# Patient Record
Sex: Female | Born: 1955 | Race: White | Hispanic: No | Marital: Single | Smoking: Former smoker
Health system: Southern US, Community
[De-identification: ages and names within clinical notes are randomized; demographics above are authoritative.]

## PROBLEM LIST (undated history)

## (undated) DIAGNOSIS — D649 Anemia, unspecified: Secondary | ICD-10-CM

## (undated) DIAGNOSIS — E039 Hypothyroidism, unspecified: Secondary | ICD-10-CM

## (undated) DIAGNOSIS — M549 Dorsalgia, unspecified: Secondary | ICD-10-CM

## (undated) DIAGNOSIS — F319 Bipolar disorder, unspecified: Secondary | ICD-10-CM

## (undated) DIAGNOSIS — F32A Depression, unspecified: Secondary | ICD-10-CM

## (undated) DIAGNOSIS — E119 Type 2 diabetes mellitus without complications: Secondary | ICD-10-CM

## (undated) DIAGNOSIS — R269 Unspecified abnormalities of gait and mobility: Secondary | ICD-10-CM

## (undated) DIAGNOSIS — R413 Other amnesia: Secondary | ICD-10-CM

## (undated) DIAGNOSIS — E785 Hyperlipidemia, unspecified: Secondary | ICD-10-CM

## (undated) DIAGNOSIS — G8929 Other chronic pain: Secondary | ICD-10-CM

## (undated) DIAGNOSIS — F603 Borderline personality disorder: Secondary | ICD-10-CM

## (undated) DIAGNOSIS — I10 Essential (primary) hypertension: Secondary | ICD-10-CM

## (undated) DIAGNOSIS — K219 Gastro-esophageal reflux disease without esophagitis: Secondary | ICD-10-CM

## (undated) DIAGNOSIS — F259 Schizoaffective disorder, unspecified: Secondary | ICD-10-CM

## (undated) DIAGNOSIS — F329 Major depressive disorder, single episode, unspecified: Secondary | ICD-10-CM

## (undated) DIAGNOSIS — F209 Schizophrenia, unspecified: Secondary | ICD-10-CM

## (undated) HISTORY — PX: KNEE SURGERY: SHX244

## (undated) HISTORY — DX: Other amnesia: R41.3

## (undated) HISTORY — PX: APPENDECTOMY: SHX54

## (undated) HISTORY — PX: CHOLECYSTECTOMY: SHX55

## (undated) HISTORY — DX: Unspecified abnormalities of gait and mobility: R26.9

---

## 1998-09-27 ENCOUNTER — Inpatient Hospital Stay (HOSPITAL_COMMUNITY): Admission: AD | Admit: 1998-09-27 | Discharge: 1998-09-30 | Payer: Self-pay | Admitting: Psychiatry

## 2006-11-02 ENCOUNTER — Emergency Department (HOSPITAL_COMMUNITY): Admission: EM | Admit: 2006-11-02 | Discharge: 2006-11-02 | Payer: Self-pay | Admitting: Emergency Medicine

## 2006-12-04 ENCOUNTER — Ambulatory Visit (HOSPITAL_COMMUNITY): Admission: RE | Admit: 2006-12-04 | Discharge: 2006-12-04 | Payer: Self-pay | Admitting: Internal Medicine

## 2007-09-03 ENCOUNTER — Encounter: Admission: RE | Admit: 2007-09-03 | Discharge: 2007-09-03 | Payer: Self-pay | Admitting: Internal Medicine

## 2007-09-21 ENCOUNTER — Emergency Department (HOSPITAL_COMMUNITY): Admission: EM | Admit: 2007-09-21 | Discharge: 2007-09-21 | Payer: Self-pay | Admitting: Emergency Medicine

## 2007-12-08 ENCOUNTER — Ambulatory Visit (HOSPITAL_COMMUNITY): Admission: RE | Admit: 2007-12-08 | Discharge: 2007-12-08 | Payer: Self-pay | Admitting: Internal Medicine

## 2009-08-16 ENCOUNTER — Encounter: Admission: RE | Admit: 2009-08-16 | Discharge: 2009-08-16 | Payer: Self-pay | Admitting: Internal Medicine

## 2010-10-08 ENCOUNTER — Encounter: Payer: Self-pay | Admitting: Internal Medicine

## 2010-12-31 ENCOUNTER — Emergency Department (HOSPITAL_COMMUNITY)
Admission: EM | Admit: 2010-12-31 | Discharge: 2011-01-01 | Disposition: A | Discharge status: Other Acute Inpatient Hospital | Payer: Medicare Other | Source: Home / Self Care | Attending: Emergency Medicine | Admitting: Emergency Medicine

## 2010-12-31 DIAGNOSIS — E0590 Thyrotoxicosis, unspecified without thyrotoxic crisis or storm: Secondary | ICD-10-CM | POA: Insufficient documentation

## 2010-12-31 DIAGNOSIS — I10 Essential (primary) hypertension: Secondary | ICD-10-CM | POA: Insufficient documentation

## 2010-12-31 DIAGNOSIS — F329 Major depressive disorder, single episode, unspecified: Secondary | ICD-10-CM | POA: Insufficient documentation

## 2010-12-31 DIAGNOSIS — Z79899 Other long term (current) drug therapy: Secondary | ICD-10-CM | POA: Insufficient documentation

## 2010-12-31 DIAGNOSIS — E119 Type 2 diabetes mellitus without complications: Secondary | ICD-10-CM | POA: Insufficient documentation

## 2010-12-31 DIAGNOSIS — R45851 Suicidal ideations: Secondary | ICD-10-CM | POA: Insufficient documentation

## 2010-12-31 DIAGNOSIS — F3289 Other specified depressive episodes: Secondary | ICD-10-CM | POA: Insufficient documentation

## 2010-12-31 DIAGNOSIS — F209 Schizophrenia, unspecified: Secondary | ICD-10-CM | POA: Insufficient documentation

## 2010-12-31 DIAGNOSIS — IMO0002 Reserved for concepts with insufficient information to code with codable children: Secondary | ICD-10-CM | POA: Insufficient documentation

## 2010-12-31 LAB — COMPREHENSIVE METABOLIC PANEL
ALT: 9 U/L (ref 0–35)
AST: 14 U/L (ref 0–37)
Albumin: 3.6 g/dL (ref 3.5–5.2)
Alkaline Phosphatase: 46 U/L (ref 39–117)
BUN: 23 mg/dL (ref 6–23)
CO2: 27 mEq/L (ref 19–32)
Calcium: 9.3 mg/dL (ref 8.4–10.5)
Chloride: 99 mEq/L (ref 96–112)
Creatinine, Ser: 1.38 mg/dL — ABNORMAL HIGH (ref 0.4–1.2)
GFR calc Af Amer: 48 mL/min — ABNORMAL LOW (ref >60)
GFR calc non Af Amer: 40 mL/min — ABNORMAL LOW (ref >60)
Glucose, Bld: 107 mg/dL — ABNORMAL HIGH (ref 70–99)
Potassium: 5.2 mEq/L — ABNORMAL HIGH (ref 3.5–5.1)
Sodium: 135 mEq/L (ref 135–145)
Total Bilirubin: 0.6 mg/dL (ref 0.3–1.2)
Total Protein: 7.4 g/dL (ref 6.0–8.3)

## 2010-12-31 LAB — URINALYSIS, ROUTINE W REFLEX MICROSCOPIC
Bilirubin Urine: NEGATIVE
Glucose, UA: NEGATIVE mg/dL
Hgb urine dipstick: NEGATIVE
Nitrite: NEGATIVE
Protein, ur: NEGATIVE mg/dL
Specific Gravity, Urine: 1.025 (ref 1.005–1.030)
Urobilinogen, UA: 1 mg/dL (ref 0.0–1.0)
pH: 7 (ref 5.0–8.0)

## 2010-12-31 LAB — DIFFERENTIAL
Basophils Absolute: 0 10*3/uL (ref 0.0–0.1)
Basophils Relative: 0 % (ref 0–1)
Eosinophils Absolute: 0.3 10*3/uL (ref 0.0–0.7)
Eosinophils Relative: 3 % (ref 0–5)
Lymphocytes Relative: 29 % (ref 12–46)
Lymphs Abs: 2.9 10*3/uL (ref 0.7–4.0)
Monocytes Absolute: 1.2 10*3/uL — ABNORMAL HIGH (ref 0.1–1.0)
Monocytes Relative: 12 % (ref 3–12)
Neutro Abs: 5.7 10*3/uL (ref 1.7–7.7)
Neutrophils Relative %: 57 % (ref 43–77)

## 2010-12-31 LAB — VALPROIC ACID LEVEL: Valproic Acid Lvl: 59.4 ug/mL (ref 50.0–100.0)

## 2010-12-31 LAB — RAPID URINE DRUG SCREEN, HOSP PERFORMED
Amphetamines: NOT DETECTED
Barbiturates: NOT DETECTED
Benzodiazepines: NOT DETECTED
Cocaine: NOT DETECTED
Opiates: NOT DETECTED
Tetrahydrocannabinol: NOT DETECTED

## 2010-12-31 LAB — CBC
HCT: 37.9 % (ref 36.0–46.0)
Hemoglobin: 12.8 g/dL (ref 12.0–15.0)
MCH: 30 pg (ref 26.0–34.0)
MCHC: 33.8 g/dL (ref 30.0–36.0)
MCV: 89 fL (ref 78.0–100.0)
Platelets: 326 10*3/uL (ref 150–400)
RBC: 4.26 MIL/uL (ref 3.87–5.11)
RDW: 13.1 % (ref 11.5–15.5)
WBC: 10.1 10*3/uL (ref 4.0–10.5)

## 2010-12-31 LAB — ETHANOL: Alcohol, Ethyl (B): <5 mg/dL (ref 0–10)

## 2011-01-01 ENCOUNTER — Inpatient Hospital Stay (HOSPITAL_COMMUNITY)
Admission: AD | Admit: 2011-01-01 | Discharge: 2011-01-11 | DRG: 885 | Disposition: A | Discharge status: Home / Self Care | Payer: Medicare Other | Source: Ambulatory Visit | Attending: Psychiatry | Admitting: Psychiatry

## 2011-01-01 DIAGNOSIS — I129 Hypertensive chronic kidney disease with stage 1 through stage 4 chronic kidney disease, or unspecified chronic kidney disease: Secondary | ICD-10-CM

## 2011-01-01 DIAGNOSIS — Z7982 Long term (current) use of aspirin: Secondary | ICD-10-CM

## 2011-01-01 DIAGNOSIS — S61509A Unspecified open wound of unspecified wrist, initial encounter: Secondary | ICD-10-CM

## 2011-01-01 DIAGNOSIS — F1020 Alcohol dependence, uncomplicated: Secondary | ICD-10-CM

## 2011-01-01 DIAGNOSIS — E039 Hypothyroidism, unspecified: Secondary | ICD-10-CM

## 2011-01-01 DIAGNOSIS — F603 Borderline personality disorder: Secondary | ICD-10-CM

## 2011-01-01 DIAGNOSIS — E119 Type 2 diabetes mellitus without complications: Secondary | ICD-10-CM

## 2011-01-01 DIAGNOSIS — F3130 Bipolar disorder, current episode depressed, mild or moderate severity, unspecified: Secondary | ICD-10-CM

## 2011-01-01 DIAGNOSIS — E785 Hyperlipidemia, unspecified: Secondary | ICD-10-CM

## 2011-01-01 DIAGNOSIS — K219 Gastro-esophageal reflux disease without esophagitis: Secondary | ICD-10-CM

## 2011-01-01 DIAGNOSIS — N189 Chronic kidney disease, unspecified: Secondary | ICD-10-CM

## 2011-01-01 DIAGNOSIS — F259 Schizoaffective disorder, unspecified: Principal | ICD-10-CM

## 2011-01-01 DIAGNOSIS — X838XXA Intentional self-harm by other specified means, initial encounter: Secondary | ICD-10-CM

## 2011-01-01 DIAGNOSIS — E875 Hyperkalemia: Secondary | ICD-10-CM

## 2011-01-01 DIAGNOSIS — E871 Hypo-osmolality and hyponatremia: Secondary | ICD-10-CM

## 2011-01-01 DIAGNOSIS — Z560 Unemployment, unspecified: Secondary | ICD-10-CM

## 2011-01-01 LAB — GLUCOSE, CAPILLARY: Glucose-Capillary: 89 mg/dL (ref 70–99)

## 2011-01-02 LAB — BASIC METABOLIC PANEL
BUN: 27 mg/dL — ABNORMAL HIGH (ref 6–23)
CO2: 29 mEq/L (ref 19–32)
Calcium: 9 mg/dL (ref 8.4–10.5)
Chloride: 90 mEq/L — ABNORMAL LOW (ref 96–112)
Creatinine, Ser: 1.37 mg/dL — ABNORMAL HIGH (ref 0.4–1.2)
GFR calc Af Amer: 49 mL/min — ABNORMAL LOW (ref >60)
GFR calc non Af Amer: 40 mL/min — ABNORMAL LOW (ref >60)
Glucose, Bld: 156 mg/dL — ABNORMAL HIGH (ref 70–99)
Potassium: 4.9 mEq/L (ref 3.5–5.1)
Sodium: 126 mEq/L — ABNORMAL LOW (ref 135–145)

## 2011-01-02 LAB — GLUCOSE, CAPILLARY: Glucose-Capillary: 103 mg/dL — ABNORMAL HIGH (ref 70–99)

## 2011-01-02 NOTE — Consult Note (Addendum)
  [name], [name]               ACCOUNT NO.:  [id number]  MEDICAL RECORD NO.:  [id number]           PATIENT TYPE:  E  LOCATION:  WLED                         FACILITY:  [facility]  PHYSICIAN:  Eulogio Ditch, MD DATE OF BIRTH:  1955-10-26  DATE OF CONSULTATION:  01/01/2011 DATE OF DISCHARGE:                                CONSULTATION   REASON FOR CONSULTATION:  Hearing voices and suicidal.  HISTORY OF PRESENT ILLNESS:  I saw the patient and reviewed the medical records.  A 55 year old white female with history of bipolar disorder, schizophrenia who was admitted to [facility] ED after she started having suicidal thoughts.  On asking whether she was compliant with her medications, she told me she takes medications every day.  Her Depakote level is within normal limits at 53.  She told me that she was feeling depressed because of family situation and where she lives, the patient lives at a group home.  Currently, the patient still has suicidal thoughts on and off with a plan to cut her wrist.  She also reported hearing voices but they are non-commanding type.  The patient had previous 2 suicide attempts by overdose.  The patient is followed by Dr. Senaida Ores, psychiatrist, in the outpatient setting.  The patient denies any drug abuse.  MEDICAL ISSUES:  The patient also has a history of GERD, hypertension, hyperthyroidism, hyperlipidemia.  ALLERGIES:  No known drug allergies.  LABORATORY DATA:  Labs within normal limits.  Alcohol level less than 5. Depakote level 59.4.  UDS is negative.  PHYSICAL EXAMINATION:  VITAL SIGNS:  Within normal limits. Physical examination is within normal limits, done by Dr. Doug Sou.  MENTAL STATUS EXAMINATION:  The patient is calm, cooperative during the interview.  Fair eye contact.  Hygiene, grooming fair.  No abnormal movements noticed.  Mood depressed, affect constricted.  Thought form logical and goal directed.  Thought content,  still has suicidal ideations on and off with a plan to cut her wrist, not delusional. Thought perception, the patient reported hearing voices on and off non- command type.  Does not seem to be internally preoccupied.  The patient is easily redirectable but patient is not forthcoming in providing the history in detail.  Cognition, alert, awake, oriented x3.  Memory, immediate, recent remote fair.  Attention and concentration fair. Abstraction ability fair.  Insight and judgment fair.  DIAGNOSIS:  AXIS I:  As per history.  Bipolar disorder, mixed type; rule out schizoaffective disorder. AXIS II:  Deferred. AXIS III:  Diabetes, gastroesophageal reflux disease, hypertension, hyperthyroidism. AXIS IV:  Chronic mental issues. AXIS V:  30 to 40  RECOMMENDATIONS: 1. The patient be admitted to [facility] for further     stabilization. 2. The patient will be started on her current regime of medications. 3. We will try to get more collateral information from the group home.     Eulogio Ditch, MD     SA/MEDQ  D:  01/01/2011  T:  01/01/2011  Job:  [id number]  Electronically Signed by Eulogio Ditch  on 01/02/2011 06:50:02 PM

## 2011-01-03 LAB — GLUCOSE, CAPILLARY: Glucose-Capillary: 87 mg/dL (ref 70–99)

## 2011-01-04 LAB — GLUCOSE, CAPILLARY: Glucose-Capillary: 113 mg/dL — ABNORMAL HIGH (ref 70–99)

## 2011-01-05 LAB — BASIC METABOLIC PANEL
BUN: 25 mg/dL — ABNORMAL HIGH (ref 6–23)
CO2: 30 mEq/L (ref 19–32)
Calcium: 9.6 mg/dL (ref 8.4–10.5)
Chloride: 89 mEq/L — ABNORMAL LOW (ref 96–112)
Creatinine, Ser: 1.29 mg/dL — ABNORMAL HIGH (ref 0.4–1.2)
GFR calc Af Amer: 52 mL/min — ABNORMAL LOW (ref >60)
GFR calc non Af Amer: 43 mL/min — ABNORMAL LOW (ref >60)
Glucose, Bld: 103 mg/dL — ABNORMAL HIGH (ref 70–99)
Potassium: 4.7 mEq/L (ref 3.5–5.1)
Sodium: 128 mEq/L — ABNORMAL LOW (ref 135–145)

## 2011-01-05 LAB — GLUCOSE, CAPILLARY: Glucose-Capillary: 113 mg/dL — ABNORMAL HIGH (ref 70–99)

## 2011-01-06 LAB — GLUCOSE, CAPILLARY: Glucose-Capillary: 77 mg/dL (ref 70–99)

## 2011-01-06 NOTE — H&P (Signed)
NAMEPERLINE, [name]               ACCOUNT NO.:  [id number]  MEDICAL RECORD NO.:  [id number]           PATIENT TYPE:  I  LOCATION:  0405                          [facility]ACILITY:  [facility]  PHYSICIAN:  Eulogio Ditch, MD DATE OF BIRTH:  July 05, 1956  DATE OF ADMISSION:  01/01/2011 DATE OF DISCHARGE:                      PSYCHIATRIC ADMISSION ASSESSMENT   IDENTIFYING INFORMATION:  This is a 55 year old female, single.  This is a voluntary admission.  HISTORY OF PRESENT ILLNESS:  This is the first [facility] admission for [name] who has a history of chronic mental illness and previous admissions to the [facility].  She presents after brought to the emergency room by her group home manager for scratching at the dorsal surface of her left wrist.  She says that self-inflicted scratching and any type of pain helps to focus her thoughts when she is having a lot of depressive thoughts and thoughts of harming herself or members of her family.  She says that the thoughts have been worse for the past couple weeks and is not sure what is triggering it but she is approaching Mother's Day and endorses continual bereavement over her mother's death in 02/25/2005.  She endorses about 2 weeks of increasing anxiety, feeling that she has a knot in the pit of her stomach, feeling depressed with poor appetite and feeling a sense of generalized muscle tension.  Also is having vague thoughts of wanting to hurt other people but no specific targets or plans.  Today she is readily tearful as she talks about her grief regarding her mother's death in 02/25/2005.  PAST PSYCHIATRIC HISTORY:  She has long history of chronic mental illness.  She has a history of alcohol abuse that has been in complete remission for several years.  She has history of schizoaffective disorder and is currently followed as an outpatient by the [facility]. Also a history of previous admissions to the [facility] and [facility].  Most  recent hospitalization was in February 26, 2007 to [facility].  No current substance abuse.  Positive for history of borderline personality disorder and previous suicide attempts.  SOCIAL HISTORY:  Single Caucasian female, never married.  No children. Had previously worked as a Public librarian many years ago.  She is currently unemployed and living at [facility] in [place].  No legal charges.  FAMILY HISTORY:  One sister with depression and a nephew with a history of bipolar disorder.  MEDICAL HISTORY:  Primary care physician is Dr. Fleet Contras at the [facility] in [place].  Medical problems are diabetes mellitus type 2 stabilized on oral agents, hypothyroidism, dyslipidemia, GERD and anemia of unknown etiology.  CURRENT MEDICATIONS: 1. Divalproex 250 mg 3 tablets b.i.d. 2. Olanzapine 5 mg p.o. q.a.m. 3. Onglyza 5 mg 1 tablet daily. 4. Haldol Decanoate, she received 50 mg IM on December 19, 2010 and on     December 20, 2010, Dr. Leone Haven, her current psychiatrist,     increased her Haldol Decanoate 75 mg IM next due Jan 16, 2011. 5. ASA 81 mg daily. 6. Levothyroxine 88 mcg daily. 7. Lisinopril/hydrochlorothiazide 20/12.5  mg 1 tablet daily. 8. Loratadine 10 mg daily. 9. Meloxicam 15 mg daily. 10.Omeprazole 20 mg daily. 11.Simvastatin 40 mg daily. 12.Sertraline 200 mg q.a.m. 13.MiraLAX 17 grams daily p.r.n. constipation. 14.Metformin 500 mg 1 tablet b.i.d. 15.Glipizide 10 mg b.i.d. 16.Gabapentin 100 mg q.p.m. 17.Fleet's enema as needed daily for constipation. 18.Ferrous sulfate 325 mg b.i.d. 19.Cyclobenzaprine 10 mg daily as needed for muscle spasms. 20.Benztropine 2 mg b.i.d.  DRUG ALLERGIES:  None.  She reports that Prozac in the past has caused her to be "crazy."  PHYSICAL EXAMINATION:  GENERAL:  This is an obese Caucasian female, pale complected with bleached white hair, short cropped.  She weighs 115 kg, 5 feet 5 inches  tall. VITAL SIGNS:  This morning, temperature 97.3, pulse 93, respirations 18, blood pressure 111/70.  She did not require any suturing of her lacerations in the emergency room.  They are covered with a dry bandage.  DIAGNOSTIC STUDIES:  WBC 10.1, hemoglobin 12.8, hematocrit 37.9, platelets 326,000.  Chemistry:  Electrolytes with sodium 135, potassium 5.2, chloride 99, carbon dioxide 27, BUN 23, creatinine 1.38, random glucose is 107.  Hepatic function:  AST 14, ALT 9, alkaline phosphatase 46, total bilirubin 0.6.  Valproate level 59.4.  Urine drug screen is negative for all substances.  Alcohol screen negative.  UA is normal. She does display some tongue thrust movements intermittently throughout the conversation, otherwise motor is smooth.  No cogwheeling.  No rigidity.  Gait is normal and stable with normal arm swing and neuro is otherwise nonfocal.  MENTAL STATUS EXAM:  Fully alert female, cooperative, good eye contact, looks depressed, looks sad.  Is readily tearful when she begins to talk about her mother and missing her.  They had a good relationship and she has a lot of sadness, thinking of her often.  She was her caregiver prior to her death in 02-08-2005.  Mood is depressed.  Thinking logical. Denying any auditory hallucinations today.  Does endorse some passive suicidal thoughts.  No homicidal thoughts, fully oriented to person, place and situation.  Insight is adequate.  Impulse control normal. Judgment fair.  Fund of knowledge is adequate.  AXIS I:  1.  Schizoaffective disorder, depressed.  1.  Alcohol abuse in remission. AXIS II:  Deferred. AXIS III:  1.  Diabetes mellitus type 2, stable.  2.  Gastroesophageal reflux disease.  3.  Hypothyroidism.  4.  Dyslipidemia.  5.  Anemia of unclear etiology. AXIS IV:  Protracted bereavement issues. AXIS V:  Current 38, past year not known.  PLAN:  Voluntarily admit her with a goal of alleviating her suicidal thoughts, controlling her  hallucinations.  We are going to validate her Haldol Decanoate schedule with her [facility] and we will make available Haldol 5 mg q.6 hours p.r.n. for any increase in hallucinations or psychosis and will consider the possibility of different antidepressant for her.  She reports being on the Zoloft for about a couple of years and feels it is not working as well for her depression.     Margaret A. Lorin Picket, N.P.   ______________________________ Eulogio Ditch, MD    MAS/MEDQ  D:  01/02/2011  T:  01/02/2011  Job:  [id number]  Electronically Signed by Kari Baars N.P. on 01/03/2011 12:17:30 PM Electronically Signed by Eulogio Ditch  on 01/06/2011 06:31:07 AM

## 2011-01-07 LAB — GLUCOSE, CAPILLARY: Glucose-Capillary: 117 mg/dL — ABNORMAL HIGH (ref 70–99)

## 2011-01-08 LAB — GLUCOSE, CAPILLARY: Glucose-Capillary: 85 mg/dL (ref 70–99)

## 2011-01-09 LAB — BASIC METABOLIC PANEL
BUN: 26 mg/dL — ABNORMAL HIGH (ref 6–23)
CO2: 29 mEq/L (ref 19–32)
Calcium: 9.4 mg/dL (ref 8.4–10.5)
Chloride: 91 mEq/L — ABNORMAL LOW (ref 96–112)
Creatinine, Ser: 1.33 mg/dL — ABNORMAL HIGH (ref 0.4–1.2)
GFR calc Af Amer: 51 mL/min — ABNORMAL LOW (ref >60)
GFR calc non Af Amer: 42 mL/min — ABNORMAL LOW (ref >60)
Glucose, Bld: 163 mg/dL — ABNORMAL HIGH (ref 70–99)
Potassium: 5.9 mEq/L — ABNORMAL HIGH (ref 3.5–5.1)
Sodium: 128 mEq/L — ABNORMAL LOW (ref 135–145)

## 2011-01-09 LAB — GLUCOSE, CAPILLARY
Glucose-Capillary: 86 mg/dL (ref 70–99)
Glucose-Capillary: 114 mg/dL — ABNORMAL HIGH (ref 70–99)

## 2011-01-10 LAB — GLUCOSE, CAPILLARY: Glucose-Capillary: 107 mg/dL — ABNORMAL HIGH (ref 70–99)

## 2011-01-10 NOTE — Consult Note (Signed)
[name], [name]               ACCOUNT NO.:  [id number]  MEDICAL RECORD NO.:  [id number]           PATIENT TYPE:  I  LOCATION:  0405                          FACILITY:  [facility]  PHYSICIAN:  Isidor Holts, M.D.  DATE OF BIRTH:  [birth year]  DATE OF CONSULTATION:  01/10/2011 DATE OF DISCHARGE:                                CONSULTATION   PRIMARY CARE PHYSICIAN:  Fleet Contras, M.D.  REASON FOR CONSULTATION:  Electrolyte abnormalities.  HISTORY OF PRESENT ILLNESS:  This is a 55 year old female, admitted to [facility] on January 01, 2011, for depressive symptoms, auditory hallucinations, protracted bereavement issues and suicidal ideation.  Reportedly, she had a sodium of 126 at that time, with a potassium 4.9, BUN 27, creatinine 1.37.  Hydrochlorothiazide and ACE inhibitor was deemed to be the culprit medications.  These have since been discontinued.  However, the patient is still persistently hyponatremic and has a potassium today of 5.9.  We are requested to assist with management of electrolyte abnormalities.  PAST MEDICAL HISTORY: 1. Bipolar disorder. 2. Schizophrenia. 3. History of previous suicide attempts. 4. Previous history of drug overdose. 5. GERD. 6. Hypertension. 7. Hypothyroidism. 8. Type 2 diabetes mellitus.  ALLERGIES:  NO KNOWN DRUG ALLERGIES.  CURRENT MEDICATIONS: 1. Aspirin 81 mg p.o. daily. 2. Cogentin 2 mg p.o. b.i.d. 3. Celexa 40 mg p.o. daily. 4. Depakote 1500 mg p.o. q.h.s. 5. Ferrous sulfate 325 mg p.o. b.i.d. 6. Gabapentin 100 mg p.o. q.h.s. 7. Glipizide 10 mg p.o. b.i.d. 8. Levothyroxine 88 mcg p.o. daily. 9. Tradjenta 5 mg p.o. daily. 10.Claritin 10 mg p.o. daily. 11.Mobic 15 mg p.o. daily. 12.Metformin 500 mg p.o. b.i.d. 13.Zyprexa 10 mg p.o. q.h.s. 14.Protonix 80 mg p.o. daily. 15.Zocor 40 mg p.o. q.h.s. 16.Tylenol 650 mg p.o. p.r.n. q.6 hourly. 17.Flexeril 10 mg p.o. p.r.n. q.h.s. 18.Maalox 30 mL p.o. p.r.n. q.4  hourly. 19.Milk of magnesia 30 ml p.o. p.r.n. daily.  REVIEW OF SYSTEMS:  Denies headache.  Denies chest pain, shortness of breath, cough.  Denies abdominal pain, vomiting or diarrhea.  Denies ankle swelling.  Rest of systems reviewed is negative.  SOCIAL HISTORY:  The patient resides in a group home.  Ex-smoker, quit approximately 2 years ago.  Nondrinker.  Has no history of drug abuse.  SOCIAL HISTORY:  The patient is on disability.  Married, has no offspring.  FAMILY HISTORY:  The patient's mother is deceased at age 5 years status post CVA.  Her father is aged 68 years, has Alzheimer's disease. Otherwise, no active medical problems.  PHYSICAL EXAMINATION:  VITALS:  Temperature 97.4, pulse 95 per minute regular, respiratory rate 20, BP initially 180/102 mmHg, rechecked 140/96 mmHg. GENERAL:  The patient did not appear to be in obvious acute discomfort at time of this evaluation.  Alert, communicative, not short of breath at rest. HEENT:  No clinical pallor or jaundice.  No conjunctival injection. Hydration status appears fair. NECK:  Supple.  JVP not seen.  No palpable lymphadenopathy.  No palpable goiter. CHEST:  Clinically clear to auscultation.  No wheezes or crackles. HEART:  Sounds are heard, no rub or murmurs. ABDOMEN:  Moderately obese, soft, nontender.  No palpable organomegaly. No palpable masses.  Normal bowel sounds. EXTREMITIES:  Lower extremity examination showed no pitting edema. Palpable peripheral pulses. MUSCULOSKELETAL:  Unremarkable. CENTRAL NERVOUS SYSTEM:  No focal neurologic deficit on gross examination.  INVESTIGATIONS:  Sodium 128, potassium 5.9, chloride 91, CO2 29, BUN 26, creatinine 1.33, glucose 163.  CBG 107.  ASSESSMENT AND PLAN/RECOMMENDATIONS: 1. Hyponatremia.  This is still persistent, albeit improved since     hydrochlorothiazide was discontinued at time of presentation.  It     is possible that this may be chronic.  However,  unfortunately,     there are no remote labs available in the electronic medical     records.  We shall, therefore, check the patient's TSH to evaluate     for adequacy of thyroxine replacement.  Check cortisol levels and do     urine osmolalities to rule out SIADH.  Depakote must also be     considered a possible culprit, and we shall defer to psychiatry     to possibly review this medication, going forward.  2. Hyperkalemia.  We shall manage this with Kayexalate.  3. Hypertension.  The patient appears to have a "bump" in blood     pressure.  However, review of previous vitals showed that BP had     been normal.  We shall therefore, observe and continue current     antihypertensive medications.  4. Gastroesophageal reflux disease.  This is asymptomatic.  The     patient is on proton pump inhibitor.  5. Type 2 diabetes mellitus.  The patient is on multiple oral     hypoglycemic medications, but this appears controlled.  We shall     therefore make no changes in the patient's medication.  COMMENT:  The patient is okay from medical standpoint, for discharge on January 11, 2011, as planned, once laboratory reports are noted, and if no new issues develop. We anticipate normalization of her potassium levels by January 11, 2011.  The patient may follow-up with her primary MD following discharge in the coming week, to follow-up on the lab results and address as appropriate.  Meanwhile, we shall follow at a distance. Please call if needed.  Thank you for the consultation.     Isidor Holts, M.D.     CO/MEDQ  D:  01/10/2011  T:  01/10/2011  Job:  [id number]  cc:   Eulogio Ditch, MD  Fleet Contras, M.D. Fax: [phone]  Electronically Signed by Isidor Holts M.D. on 01/10/2011 06:38:58 PM

## 2011-01-11 LAB — BASIC METABOLIC PANEL
BUN: 23 mg/dL (ref 6–23)
CO2: 28 mEq/L (ref 19–32)
Calcium: 9.3 mg/dL (ref 8.4–10.5)
Chloride: 93 mEq/L — ABNORMAL LOW (ref 96–112)
Creatinine, Ser: 1.28 mg/dL — ABNORMAL HIGH (ref 0.4–1.2)
GFR calc Af Amer: 53 mL/min — ABNORMAL LOW (ref >60)
GFR calc non Af Amer: 44 mL/min — ABNORMAL LOW (ref >60)
Glucose, Bld: 156 mg/dL — ABNORMAL HIGH (ref 70–99)
Potassium: 4.9 mEq/L (ref 3.5–5.1)
Sodium: 131 mEq/L — ABNORMAL LOW (ref 135–145)

## 2011-01-11 LAB — TSH: TSH: 5.139 u[IU]/mL — ABNORMAL HIGH (ref 0.350–4.500)

## 2011-01-11 LAB — GLUCOSE, CAPILLARY: Glucose-Capillary: 93 mg/dL (ref 70–99)

## 2011-01-11 LAB — OSMOLALITY: Osmolality: 286 mOsm/kg (ref 275–300)

## 2011-01-11 LAB — OSMOLALITY, URINE: Osmolality, Ur: 391 mOsm/kg (ref 390–1090)

## 2011-01-11 LAB — CORTISOL: Cortisol, Plasma: 8.5 ug/dL

## 2011-01-23 NOTE — Discharge Summary (Signed)
NAMEJASMON, MATTICE               ACCOUNT NO.:  [id number]  MEDICAL RECORD NO.:  [id number]           PATIENT TYPE:  I  LOCATION:  0405                          [facility]ACILITY:  [facility]  PHYSICIAN:  Eulogio Ditch, MD DATE OF BIRTH:  1956-03-09  DATE OF ADMISSION:  01/01/2011 DATE OF DISCHARGE:  01/09/2011                              DISCHARGE SUMMARY   IDENTIFYING INFORMATION:  This is a 55 year old single Caucasian female. This is an involuntary admission.  HISTORY OF PRESENT ILLNESS:  [facility] admission for [name] who was brought to our emergency room by her group home manager after scratching at the dorsal surface of her wrist with her fingernail.  She has a history of self-inflicted cutting reporting that the pain helps to focus her thoughts when she is having a lot of depressive thoughts and that she feels calmer when she can see blood.  They were concerned about her behavior escalating.  Mother's Day was approaching and she endorsed continued bereavement over the death of her mother in 02-07-05.  She reported 2 weeks of increasing anxiety, depression, decreased appetite, sense of generalized muscle tension, anxiety and vague thoughts of wanting to hurt other people, but without any specific targets or plans. Initial presentation, she was readily tearful and appeared quite depressed.  PRIMARY CARE PHYSICIAN:  Fleet Contras, M.D. at the [facility] on [address]  CHRONIC MEDICAL PROBLEMS: 1. Diabetes mellitus type 2 on oral agents. 2. Hypothyroidism. 3. Dyslipidemia. 4. GERD. 5. Anemia of unknown etiology, all stable.  Her wrist lesion was very superficial requiring a Band-Aid.  Her valproate level was found to be 59.4.  Liver enzymes normal.  Normal chemistry.  Normal CBC.  PHYSICAL EXAMINATION:  On initial physical exam, she did display some tongue thrust movements intermittently throughout the conversation, otherwise motor is smooth.  No evidence of cogwheeling or  muscle rigidity.  Gait normal and stable with normal arm swing.  COURSE OF HOSPITALIZATION:  She was admitted to our acute stabilization unit and given an initial working diagnosis of schizoaffective disorder, depressed, also alcohol abuse in remission.  We elected to continue her routine diabetes medications along with levothyroxine 88 mcg daily and her antihypertensives that she was taking at home.  Her diabetes remained stable throughout her stay here.  To manage her depression symptoms, we discontinued the sertraline 200 mg q.a.m. which she had taken for more than 2 years in which she felt was not working.  She was started on Celexa 40 mg daily which she tolerated well.  Her lisinopril/HCTZ 20/12.5 mg was discontinued due to an initial potassium of 5.2 and creatinine of 1.38.  Throughout her stay, her potassium level fluctuated between 4.9 and 5.2.  At one point, she received a Kayexalate 15 gm p.o. twice to manage her potassium.  Her blood pressure remained under good control without additional antihypertensives.  Valproic acid was increased to 1500 mg extended release nightly.  Her Zyprexa was increased from 5 mg q.a.m. to 10 mg p.o. nightly.  She had been taking Haldol Decanoate 50 mg every 4 weeks and that had been recently increased by her psychiatrist  to 75 mg q.4 weeks.  We continued this without changes.  Her next dose is due on January 12, 2011. By January 10, 2011, her affect was much brighter.  Her participation in group therapy had been consistently good throughout her stay. Interactions with staff and peers had been appropriate.  She was no longer feeling suicidal, much more articulate, brighter affect, requesting to go home and denying any suicidal thoughts.  CONSULTATIONS:  Internal Medicine consultation by Isidor Holts, M.D. for hyponatremia, hyperkalemia in the setting of hypertension and diabetes.  The patient was treated with Kayexalate.  He agreed with our plan  to discontinue her HCTZ/lisinopril with a plan for her to follow up with her primary care physician.  DISCHARGE/PLAN: 1. Follow up with the [facility] on the day of discharge. 2. Follow up with Dr. Fleet Contras Jan 18, 2011 at 2:30 p.m.  DISCHARGE DIAGNOSES:  AXIS I:  Schizoaffective disorder bipolar type, depressed, history of alcohol dependence, currently in remission. AXIS II:  Borderline personality disorder. AXIS III:  Dyslipidemia, gastroesophageal reflux disease, hypertension, question of chronic renal insufficiency, diabetes mellitus type 2 controlled with oral agents and superficial self-inflicted lesion, left wrist. AXIS IV:  Note chronic bereavement issues. AXIS V:  Current 58, past year 64 estimated.  DISCHARGE MEDICATIONS: 1. Citalopram 40 mg daily. 2. Divalproex ER 1500 mg nightly. 3. Olanzapine 10 mg nightly. 4. Pneumococcal vaccine received here at [facility]. 5. Aspirin 81 mg daily. 6. Benztropine 2 mg b.i.d. 7. Cyclobenzaprine 10 mg nightly. 8. Ferrous sulfate 325 mg b.i.d. 9. Fleet's enema as needed for constipation. 10.Gabapentin 100 mg one capsule nightly. 11.Glipizide 10 mg b.i.d. 12.Haloperidol decanoate 75 mg IM q.4 weeks, next due January 12, 2011. 13.Levothyroxine 88 mcg daily. 14.Loratadine 10 mg daily. 15.Meloxicam 15 mg daily. 16.Metformin 500 mg b.i.d. 17.Omeprazole 40 mg daily. 18.Onglyza one daily 5 mg. 19.MiraLAX 17 gm daily. 20.Simvastatin 40 mg daily. 21.She was instructed to discontinue sertraline and lisinopril/HCTZ.     Margaret A. Lorin Picket, N.P.   ______________________________ Eulogio Ditch, MD    [name]  D:  01/22/2011  T:  01/22/2011  Job:  [id number]  Electronically Signed by Kari Baars N.P. on 01/22/2011 01:04:43 PM Electronically Signed by Eulogio Ditch  on 01/23/2011 04:50:38 PM

## 2011-05-01 ENCOUNTER — Other Ambulatory Visit: Payer: Self-pay | Admitting: Internal Medicine

## 2011-05-01 DIAGNOSIS — Z1231 Encounter for screening mammogram for malignant neoplasm of breast: Secondary | ICD-10-CM

## 2011-05-29 ENCOUNTER — Ambulatory Visit
Admission: RE | Admit: 2011-05-29 | Discharge: 2011-05-29 | Disposition: A | Discharge status: Home / Self Care | Payer: Medicare Other | Source: Ambulatory Visit | Attending: Internal Medicine | Admitting: Internal Medicine

## 2011-05-29 DIAGNOSIS — Z1231 Encounter for screening mammogram for malignant neoplasm of breast: Secondary | ICD-10-CM

## 2011-06-07 LAB — OCCULT BLOOD X 1 CARD TO LAB, STOOL: Fecal Occult Bld: NEGATIVE

## 2011-10-16 ENCOUNTER — Encounter (HOSPITAL_COMMUNITY): Payer: Self-pay | Admitting: *Deleted

## 2011-10-16 ENCOUNTER — Emergency Department (HOSPITAL_COMMUNITY)
Admission: EM | Admit: 2011-10-16 | Discharge: 2011-10-17 | Disposition: A | Discharge status: Other Acute Inpatient Hospital | Payer: Medicare Other | Attending: Emergency Medicine | Admitting: Emergency Medicine

## 2011-10-16 DIAGNOSIS — E119 Type 2 diabetes mellitus without complications: Secondary | ICD-10-CM | POA: Insufficient documentation

## 2011-10-16 DIAGNOSIS — F3289 Other specified depressive episodes: Secondary | ICD-10-CM | POA: Insufficient documentation

## 2011-10-16 DIAGNOSIS — R443 Hallucinations, unspecified: Secondary | ICD-10-CM | POA: Insufficient documentation

## 2011-10-16 DIAGNOSIS — L905 Scar conditions and fibrosis of skin: Secondary | ICD-10-CM | POA: Insufficient documentation

## 2011-10-16 DIAGNOSIS — F329 Major depressive disorder, single episode, unspecified: Secondary | ICD-10-CM | POA: Insufficient documentation

## 2011-10-16 DIAGNOSIS — R45851 Suicidal ideations: Secondary | ICD-10-CM

## 2011-10-16 HISTORY — DX: Gastro-esophageal reflux disease without esophagitis: K21.9

## 2011-10-16 HISTORY — DX: Dorsalgia, unspecified: M54.9

## 2011-10-16 HISTORY — DX: Other chronic pain: G89.29

## 2011-10-16 HISTORY — DX: Hypothyroidism, unspecified: E03.9

## 2011-10-16 HISTORY — DX: Depression, unspecified: F32.A

## 2011-10-16 HISTORY — DX: Hyperlipidemia, unspecified: E78.5

## 2011-10-16 HISTORY — DX: Major depressive disorder, single episode, unspecified: F32.9

## 2011-10-16 HISTORY — DX: Borderline personality disorder: F60.3

## 2011-10-16 LAB — URINALYSIS, ROUTINE W REFLEX MICROSCOPIC
Bilirubin Urine: NEGATIVE
Glucose, UA: NEGATIVE mg/dL
Hgb urine dipstick: NEGATIVE
Leukocytes, UA: NEGATIVE
Nitrite: NEGATIVE
Protein, ur: NEGATIVE mg/dL
Specific Gravity, Urine: 1.012 (ref 1.005–1.030)
Urobilinogen, UA: 1 mg/dL (ref 0.0–1.0)
pH: 6 (ref 5.0–8.0)

## 2011-10-16 LAB — CBC
HCT: 35.7 % — ABNORMAL LOW (ref 36.0–46.0)
Hemoglobin: 12.6 g/dL (ref 12.0–15.0)
MCH: 30.8 pg (ref 26.0–34.0)
MCHC: 35.3 g/dL (ref 30.0–36.0)
MCV: 87.3 fL (ref 78.0–100.0)
Platelets: 275 10*3/uL (ref 150–400)
RBC: 4.09 MIL/uL (ref 3.87–5.11)
RDW: 12.7 % (ref 11.5–15.5)
WBC: 8.9 10*3/uL (ref 4.0–10.5)

## 2011-10-16 LAB — COMPREHENSIVE METABOLIC PANEL
ALT: 6 U/L (ref 0–35)
AST: 12 U/L (ref 0–37)
Albumin: 3.8 g/dL (ref 3.5–5.2)
Alkaline Phosphatase: 37 U/L — ABNORMAL LOW (ref 39–117)
BUN: 19 mg/dL (ref 6–23)
CO2: 24 meq/L (ref 19–32)
Calcium: 9.8 mg/dL (ref 8.4–10.5)
Chloride: 88 mEq/L — ABNORMAL LOW (ref 96–112)
Creatinine, Ser: 1.27 mg/dL — ABNORMAL HIGH (ref 0.50–1.10)
GFR calc Af Amer: 55 mL/min — ABNORMAL LOW (ref >90)
GFR calc non Af Amer: 47 mL/min — ABNORMAL LOW (ref >90)
Glucose, Bld: 143 mg/dL — ABNORMAL HIGH (ref 70–99)
Potassium: 4 mEq/L (ref 3.5–5.1)
Sodium: 125 mEq/L — ABNORMAL LOW (ref 135–145)
Total Bilirubin: 0.2 mg/dL — ABNORMAL LOW (ref 0.3–1.2)
Total Protein: 8.1 g/dL (ref 6.0–8.3)

## 2011-10-16 LAB — DIFFERENTIAL
Basophils Absolute: 0 10*3/uL (ref 0.0–0.1)
Basophils Relative: 0 % (ref 0–1)
Eosinophils Absolute: 0.1 10*3/uL (ref 0.0–0.7)
Eosinophils Relative: 2 % (ref 0–5)
Lymphocytes Relative: 31 % (ref 12–46)
Lymphs Abs: 2.8 10*3/uL (ref 0.7–4.0)
Monocytes Absolute: 1 10*3/uL (ref 0.1–1.0)
Monocytes Relative: 11 % (ref 3–12)
Neutro Abs: 5 10*3/uL (ref 1.7–7.7)
Neutrophils Relative %: 56 % (ref 43–77)

## 2011-10-16 LAB — ETHANOL: Alcohol, Ethyl (B): <11 mg/dL (ref 0–11)

## 2011-10-16 MED ORDER — DIVALPROEX SODIUM ER 500 MG PO TB24
1500.0000 mg | ORAL_TABLET | Freq: Every day | ORAL | Status: DC
  Filled 2011-10-16 (×2): qty 3

## 2011-10-16 MED ORDER — FERROUS SULFATE 325 (65 FE) MG PO TABS
325.0000 mg | ORAL_TABLET | Freq: Two times a day (BID) | ORAL | Status: DC
  Administered 2011-10-17 (×2): 325 mg via ORAL
  Filled 2011-10-16 (×4): qty 1

## 2011-10-16 MED ORDER — ACETAMINOPHEN 325 MG PO TABS: 650.0000 mg | ORAL_TABLET | ORAL | Status: DC | PRN

## 2011-10-16 MED ORDER — PANTOPRAZOLE SODIUM 40 MG PO TBEC
40.0000 mg | DELAYED_RELEASE_TABLET | Freq: Every day | ORAL | Status: DC
  Administered 2011-10-16 – 2011-10-17 (×2): 40 mg via ORAL
  Filled 2011-10-16 (×3): qty 1

## 2011-10-16 MED ORDER — LORATADINE 10 MG PO TABS
10.0000 mg | ORAL_TABLET | Freq: Every day | ORAL | Status: DC
  Administered 2011-10-17: 10 mg via ORAL
  Filled 2011-10-16 (×2): qty 1

## 2011-10-16 MED ORDER — LISINOPRIL-HYDROCHLOROTHIAZIDE 10-12.5 MG PO TABS: 1.0000 | ORAL_TABLET | Freq: Every day | ORAL | Status: DC

## 2011-10-16 MED ORDER — SIMVASTATIN 40 MG PO TABS
40.0000 mg | ORAL_TABLET | Freq: Every evening | ORAL | Status: DC
  Administered 2011-10-17: 40 mg via ORAL
  Filled 2011-10-16 (×2): qty 1

## 2011-10-16 MED ORDER — LISINOPRIL 10 MG PO TABS
10.0000 mg | ORAL_TABLET | Freq: Every day | ORAL | Status: DC
  Filled 2011-10-16: qty 1

## 2011-10-16 MED ORDER — ALUM & MAG HYDROXIDE-SIMETH 200-200-20 MG/5ML PO SUSP: 30.0000 mL | ORAL | Status: DC | PRN

## 2011-10-16 MED ORDER — ONDANSETRON HCL 4 MG PO TABS: 4.0000 mg | ORAL_TABLET | Freq: Three times a day (TID) | ORAL | Status: DC | PRN

## 2011-10-16 MED ORDER — LINAGLIPTIN 5 MG PO TABS
5.0000 mg | ORAL_TABLET | Freq: Every day | ORAL | Status: DC
  Administered 2011-10-17: 5 mg via ORAL
  Filled 2011-10-16 (×2): qty 1

## 2011-10-16 MED ORDER — INSULIN ASPART 100 UNIT/ML ~~LOC~~ SOLN
0.0000 [IU] | Freq: Three times a day (TID) | SUBCUTANEOUS | Status: DC
  Administered 2011-10-17 (×2): 3 [IU] via SUBCUTANEOUS
  Filled 2011-10-16: qty 3

## 2011-10-16 MED ORDER — MELOXICAM 15 MG PO TABS
15.0000 mg | ORAL_TABLET | Freq: Every day | ORAL | Status: DC
  Administered 2011-10-17: 15 mg via ORAL
  Filled 2011-10-16 (×2): qty 1

## 2011-10-16 MED ORDER — METFORMIN HCL 500 MG PO TABS
500.0000 mg | ORAL_TABLET | Freq: Two times a day (BID) | ORAL | Status: DC
  Administered 2011-10-17 (×2): 500 mg via ORAL
  Filled 2011-10-16 (×4): qty 1

## 2011-10-16 MED ORDER — LEVOTHYROXINE SODIUM 88 MCG PO TABS
88.0000 ug | ORAL_TABLET | Freq: Every day | ORAL | Status: DC
  Administered 2011-10-17: 88 ug via ORAL
  Filled 2011-10-16 (×2): qty 1

## 2011-10-16 MED ORDER — ASPIRIN EC 81 MG PO TBEC
81.0000 mg | DELAYED_RELEASE_TABLET | Freq: Every day | ORAL | Status: DC
  Administered 2011-10-17: 81 mg via ORAL
  Filled 2011-10-16 (×2): qty 1

## 2011-10-16 MED ORDER — ZOLPIDEM TARTRATE 5 MG PO TABS
5.0000 mg | ORAL_TABLET | Freq: Every evening | ORAL | Status: DC | PRN
  Administered 2011-10-16: 5 mg via ORAL
  Filled 2011-10-16: qty 1

## 2011-10-16 MED ORDER — GLIPIZIDE 10 MG PO TABS
10.0000 mg | ORAL_TABLET | Freq: Two times a day (BID) | ORAL | Status: DC
  Administered 2011-10-17 (×2): 10 mg via ORAL
  Filled 2011-10-16 (×4): qty 1

## 2011-10-16 MED ORDER — HYDROCHLOROTHIAZIDE 12.5 MG PO CAPS
12.5000 mg | ORAL_CAPSULE | Freq: Every day | ORAL | Status: DC
  Filled 2011-10-16: qty 1

## 2011-10-16 MED ORDER — VITAMIN E 180 MG (400 UNIT) PO CAPS
400.0000 [IU] | ORAL_CAPSULE | Freq: Every day | ORAL | Status: DC
  Administered 2011-10-17: 400 [IU] via ORAL
  Filled 2011-10-16 (×2): qty 1

## 2011-10-16 MED ORDER — DOCUSATE SODIUM 100 MG PO CAPS
100.0000 mg | ORAL_CAPSULE | Freq: Two times a day (BID) | ORAL | Status: DC
  Administered 2011-10-16 – 2011-10-17 (×2): 100 mg via ORAL
  Filled 2011-10-16 (×5): qty 1

## 2011-10-16 MED ORDER — BENZTROPINE MESYLATE 1 MG PO TABS
2.0000 mg | ORAL_TABLET | Freq: Two times a day (BID) | ORAL | Status: DC
  Administered 2011-10-16 – 2011-10-17 (×2): 2 mg via ORAL
  Filled 2011-10-16 (×2): qty 1
  Filled 2011-10-16: qty 2

## 2011-10-16 MED ORDER — CITALOPRAM HYDROBROMIDE 40 MG PO TABS
40.0000 mg | ORAL_TABLET | Freq: Every day | ORAL | Status: DC
  Filled 2011-10-16 (×2): qty 1

## 2011-10-16 MED ORDER — LORAZEPAM 1 MG PO TABS
1.0000 mg | ORAL_TABLET | Freq: Three times a day (TID) | ORAL | Status: DC | PRN
  Administered 2011-10-16 – 2011-10-17 (×2): 1 mg via ORAL
  Filled 2011-10-16 (×2): qty 1

## 2011-10-16 NOTE — BH Assessment (Signed)
Assessment Note   [name] is an 56 y.o. female. Pt reported to the [facility] with a chief complaint of suicidal ideations. Pt reports that she has had multiple stressors causing increased depression recently. Pt states that her brother died 9 years ago in an accident and she has had a difficult time coping with the loss, in addition to medical concerns with TMJ. Pt states that for the past 4 days "the devil has been inside my head causing chaos" "he wants to take my soul" and has been telling me to "kill myself". Per EDP pt has a hx of schizoaffective disorder and borderline behaviors. Pt states that the depression has increased with the hallucinations/delusions over the past few days. Pt states that she does not want to kill herself, stating that she just wants to harm herself. Pt added that she wants to place her hands in boiling water. Pt shared that she is not sure how it will feel afterwards but she feels it will get rid of the stressors. Pt reports a hx of self injurious behavior including cutting and burning her arms. Throughout assessment pt demonstrated symptoms of tardive dyskinesia, repeatedly licking and smacking her lips. Pt is currently denying any SI/HI. Pt endorses depression and auditory hallucinations she believes is the devil.  Pt information is being sent to [facility] for review for possible disposition.     Axis I: Schizoaffective Disorder Axis II: Deferred Axis III:  Past Medical History  Diagnosis Date  . Diabetes mellitus   . GERD (gastroesophageal reflux disease)   . Borderline behavior   . Schizoaffective disorder   . Depression   . Hyperlipidemia   . Chronic back pain   . Hypothyroidism    Axis IV: other psychosocial or environmental problems Axis V: 21-30 behavior considerably influenced by delusions or hallucinations OR serious impairment in judgment, communication OR inability to function in almost all areas  Past Medical History:  Past Medical History  Diagnosis  Date  . Diabetes mellitus   . GERD (gastroesophageal reflux disease)   . Borderline behavior   . Schizoaffective disorder   . Depression   . Hyperlipidemia   . Chronic back pain   . Hypothyroidism     Past Surgical History  Procedure Date  . Appendectomy   . Knee surgery     right  . Cholecystectomy     Family History: No family history on file.  Social History:  reports that she has quit smoking. She does not have any smokeless tobacco history on file. She reports that she does not drink alcohol or use illicit drugs.  Additional Social History:    Allergies: No Known Allergies  Home Medications:  Medications Prior to Admission  Medication Dose Route Frequency Provider Last Rate Last Dose  . acetaminophen (TYLENOL) tablet 650 mg  650 mg Oral Q4H PRN Grant Fontana, PA      . alum & mag hydroxide-simeth (MAALOX/MYLANTA) 200-200-20 MG/5ML suspension 30 mL  30 mL Oral PRN Grant Fontana, PA      . insulin aspart (novoLOG) injection 0-20 Units  0-20 Units Subcutaneous TID WC Grant Fontana, [place]      . LORazepam (ATIVAN) tablet 1 mg  1 mg Oral Q8H PRN Grant Fontana, PA   1 mg at 10/16/11 2009  . ondansetron (ZOFRAN) tablet 4 mg  4 mg Oral Q8H PRN Grant Fontana, PA      . zolpidem (AMBIEN) tablet 5 mg  5 mg Oral QHS PRN Grant Fontana, PA  No current outpatient prescriptions on file as of 10/16/2011.    OB/GYN Status:  No LMP recorded. Patient is postmenopausal.  General Assessment Data Location of Assessment: [facility] ED Living Arrangements: Group Home Can pt return to current living arrangement?: Yes Admission Status: Voluntary Is patient capable of signing voluntary admission?: Yes Transfer from: Acute Hospital Referral Source: Self/Family/Friend  Education Status Is patient currently in school?: No  Risk to self Suicidal Ideation: No-Not Currently/Within Last 6 Months Suicidal Intent: No-Not Currently/Within Last 6 Months Is patient at risk  for suicide?: Yes Suicidal Plan?: No Access to Means: No What has been your use of drugs/alcohol within the last 12 months?: pt reports hx of substance abuse- has been to rehab 4x Previous Attempts/Gestures: Yes How many times?: 2  (Overdose on medication) Other Self Harm Risks: yes Triggers for Past Attempts: Other (Comment) (substance abuse, depression) Intentional Self Injurious Behavior: Cutting;Burning Comment - Self Injurious Behavior: pt has a hx of cutting and burning arms triggered by depression' Family Suicide History: No Recent stressful life event(s): Loss (Comment);Other (Comment) (brother died 1yrs. ago, medical concerns ) Persecutory voices/beliefs?: Yes Depression: Yes Depression Symptoms: Insomnia;Tearfulness;Isolating;Fatigue;Loss of interest in usual pleasures Substance abuse history and/or treatment for substance abuse?: Yes Suicide prevention information given to non-admitted patients: Not applicable  Risk to Others Homicidal Ideation: No-Not Currently/Within Last 6 Months Thoughts of Harm to Others: No-Not Currently Present/Within Last 6 Months Current Homicidal Intent: No-Not Currently/Within Last 6 Months Current Homicidal Plan: No-Not Currently/Within Last 6 Months Access to Homicidal Means: No Identified Victim: none History of harm to others?: No Assessment of Violence: None Noted Violent Behavior Description: pt is calm and cooperative Does patient have access to weapons?: No Criminal Charges Pending?: No Does patient have a court date: No  Psychosis Hallucinations: Auditory;With command (command pt to "kill herself") Delusions: Unspecified (per pt "devil is inside my head causing chaos")  Mental Status Report Appear/Hygiene: Disheveled Eye Contact: Fair Motor Activity: Mannerisms (tardive dyskinesia: constantly licking lips) Speech: Logical/coherent;Soft Level of Consciousness: Alert;Quiet/awake Mood: Depressed Affect: Blunted;Depressed Anxiety  Level: Minimal Thought Processes: Coherent;Relevant Judgement: Impaired Orientation: Person;Place;Time;Situation Obsessive Compulsive Thoughts/Behaviors: None  Cognitive Functioning Concentration: Decreased Memory: Recent Intact;Remote Intact IQ: Average Insight: Poor Impulse Control: Poor Appetite: Poor Weight Loss: 8  (over 2 months) Weight Gain: 0  Sleep: Decreased Total Hours of Sleep: 4  (1 week) Vegetative Symptoms: None  Prior Inpatient Therapy Prior Inpatient Therapy: Yes Prior Therapy Dates: 2008, 2012 Prior Therapy Facilty/Provider(s): Multiple admits to [place], [facility] Reason for Treatment: depression, SI  Prior Outpatient Therapy Prior Outpatient Therapy: Yes Prior Therapy Dates: 2012-2013 Prior Therapy Facilty/Provider([facility]): [facility] Reason for Treatment: depression, bipolar            Values / Beliefs Cultural Requests During Hospitalization: None Spiritual Requests During Hospitalization: None        Additional Information 1:1 In Past 12 Months?: No CIRT Risk: No Elopement Risk: No Does patient have medical clearance?: Yes     Disposition:  Disposition Disposition of Patient: Inpatient treatment program [facility]) Type of inpatient treatment program: Adult  On Site Evaluation by:   Reviewed with Physician:     [place] Crane F 10/16/2011 9:10 PM

## 2011-10-16 NOTE — ED Notes (Signed)
Pt states that she has been having thoughts of hurting herself by sticking her hands in boiling water.  Has had these thoughts x 4 days.  Increased depression.  Denies SI/HI and AVH.  Thinks the devil is trying to take her over.

## 2011-10-16 NOTE — ED Notes (Signed)
Pt states "I've had a lot of things going on in my life, my brother dying"; Outside Act with pt states "she has mentioned putting her hand in boiling water"; pt states "I don't want to kill myself just hurt myself"
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
## 2011-10-16 NOTE — ED Notes (Signed)
1 bag belongings in locker 41

## 2011-10-16 NOTE — ED Notes (Signed)
Pt given sandwich and diet sprite

## 2011-10-16 NOTE — ED Notes (Addendum)
Pt comes to the bathroom after taking off her paper scrubs and is completely naked. Pt slightly trembling, stating she feels very nervous. Ativan 1mg  PO given per PRN order for anxiety and pt is helped into two gowns and walked back to her room after using the bathroom. When writer returns to the computer, pt adm labs have been posted and labs called in to MD for review, Na 125 and Chloride 88. Fredricka Bonine states he will notify [place].

## 2011-10-16 NOTE — ED Notes (Signed)
Pt provided with sandwich & diet sprite

## 2011-10-16 NOTE — ED Notes (Signed)
Pt is from a group home

## 2011-10-16 NOTE — ED Notes (Signed)
Pt currently resides at [facility] in [place].

## 2011-10-16 NOTE — ED Notes (Signed)
Call placed to [facility] mobile crisis team member called, as pharm tech has not had any success in reaching facility regarding med rec. PA awaiting med rec in order to place orders. [facility] team member connected to pharm tech by Clinical research associate.

## 2011-10-17 LAB — GLUCOSE, CAPILLARY
Glucose-Capillary: 106 mg/dL — ABNORMAL HIGH (ref 70–99)
Glucose-Capillary: 123 mg/dL — ABNORMAL HIGH (ref 70–99)
Glucose-Capillary: 135 mg/dL — ABNORMAL HIGH (ref 70–99)
Glucose-Capillary: 158 mg/dL — ABNORMAL HIGH (ref 70–99)

## 2011-10-17 LAB — BASIC METABOLIC PANEL
BUN: 16 mg/dL (ref 6–23)
CO2: 28 mEq/L (ref 19–32)
Calcium: 10 mg/dL (ref 8.4–10.5)
Chloride: 87 mEq/L — ABNORMAL LOW (ref 96–112)
Creatinine, Ser: 1.24 mg/dL — ABNORMAL HIGH (ref 0.50–1.10)
GFR calc Af Amer: 56 mL/min — ABNORMAL LOW (ref >90)
GFR calc non Af Amer: 49 mL/min — ABNORMAL LOW (ref >90)
Glucose, Bld: 107 mg/dL — ABNORMAL HIGH (ref 70–99)
Potassium: 4.2 mEq/L (ref 3.5–5.1)
Sodium: 126 mEq/L — ABNORMAL LOW (ref 135–145)

## 2011-10-17 NOTE — ED Provider Notes (Signed)
Medical screening examination/treatment/procedure(s) were conducted as a shared visit with non-physician practitioner(s) and myself.  I personally evaluated the patient during the encounter.. complains of depression and feelings of self-harm. Will get psychiatric consult. Hyponatremia is not new  Donnetta Hutching, MD 10/17/11 [phone]

## 2011-10-17 NOTE — ED Provider Notes (Signed)
The [facility] has been [facility] by [facility]. Candie Echevaria.  Dione Booze, MD 10/17/11 1736

## 2011-10-17 NOTE — ED Notes (Signed)
While in patients room taking a.m. Vital signs patient asked "Has the Vice President came to see me yet"? When I asked if she meant the VP of the hospital she stated "No, the country. He came to me in my dreams last night and said he would be here to see me today."

## 2011-10-17 NOTE — ED Notes (Signed)
Patient up to restroom completely naked. Assisted pt with restroom and put gown back on. Explained to patient that she must keep her clothes on from now on. Patient agreed.

## 2011-10-17 NOTE — ED Provider Notes (Signed)
Pt awaiting act placement. Pt alert, content. No distress. Na low. Noted previously (4/12) - during that [facility] stay, pt had med consult rec d/c'ing ace/hctz, pts na normalized then. pts ace/hctz d/c'd. Plan recheck bmet tomorrow am. Psych placement pending, act will continue to work on.   Suzi Roots, MD 10/17/11 [phone]

## 2011-10-17 NOTE — BH Assessment (Signed)
Assessment Note   [name] is an 56 y.o. female. Pt reported to the [facility] on 10/16/11 with a chief complaint of suicidal ideations. Pt reports that she has had multiple stressors causing increased depression recently. Pt states that her brother died 9 years ago in an accident and she has had a difficult time coping with the loss, in addition to medical concerns with TMJ. Pt states that for the past 4 days "the devil has been inside my head causing chaos" "he wants to take my soul" and has been telling me to "kill myself". Per EDP pt has a hx of schizoaffective disorder and borderline behaviors. Pt states that the depression has increased with the hallucinations/delusions over the past few days. Pt states that she does not want to kill herself, stating that she just wants to harm herself. Pt added that she wants to place her hands in boiling water. Pt shared that she is not sure how it will feel afterwards but she feels it will get rid of the stressors. Pt reports a hx of self injurious behavior including cutting and burning her arms. Throughout assessment pt demonstrated symptoms of tardive dyskinesia, repeatedly licking and smacking her lips. Pt is currently denying any SI/HI. Pt endorses depression and auditory hallucinations she believes is the devil.   Upon reassessment today, pt denies SI thoughts, but admits to wanting to self harm. Pt stated "I'm just thinking of hurting myself to take my mind off what's really bothering me.". Pt continues to have the devil speak to her instructing her to kill herself and that "he wants my soul, but I'm not going to let him do it". Pt reported sleeping better last night than she had been sleeping in the past several days. Continues to have the mannerisms of constantly licking lips or pressing tongue to the side of her mouth. Explained to pt that we were still seeking placement and her information was being reviewed. Pt denies HI.   Axis I: Schizoaffective  Disorder Axis II: Cluster B Traits Axis III:  Past Medical History  Diagnosis Date  . Diabetes mellitus   . GERD (gastroesophageal reflux disease)   . Borderline behavior   . Schizoaffective disorder   . Depression   . Hyperlipidemia   . Chronic back pain   . Hypothyroidism    Axis IV: other psychosocial or environmental problems, problems related to social environment, problems with access to health care services and problems with primary support group Axis V: 21-30 behavior considerably influenced by delusions or hallucinations OR serious impairment in judgment, communication OR inability to function in almost all areas  Past Medical History:  Past Medical History  Diagnosis Date  . Diabetes mellitus   . GERD (gastroesophageal reflux disease)   . Borderline behavior   . Schizoaffective disorder   . Depression   . Hyperlipidemia   . Chronic back pain   . Hypothyroidism     Past Surgical History  Procedure Date  . Appendectomy   . Knee surgery     right  . Cholecystectomy     Family History: No family history on file.  Social History:  reports that she has quit smoking. She does not have any smokeless tobacco history on file. She reports that she does not drink alcohol or use illicit drugs.  Additional Social History:  Alcohol / Drug Use Pain Medications: n/a Prescriptions: n/a Over the Counter: n/a Allergies: No Known Allergies  Home Medications:  Medications Prior to Admission  Medication  Dose Route Frequency Provider Last Rate Last Dose  . acetaminophen (TYLENOL) tablet 650 mg  650 mg Oral Q4H PRN Grant Fontana, PA      . alum & mag hydroxide-simeth (MAALOX/MYLANTA) 200-200-20 MG/5ML suspension 30 mL  30 mL Oral PRN Grant Fontana, PA      . aspirin EC tablet 81 mg  81 mg Oral Daily Grant Fontana, [place]   81 mg at 10/17/11 0916  . benztropine (COGENTIN) tablet 2 mg  2 mg Oral BID Grant Fontana, PA   2 mg at 10/17/11 0915  . citalopram (CELEXA)  tablet 40 mg  40 mg Oral QHS Grant Fontana, [place]      . divalproex (DEPAKOTE ER) 24 hr tablet 1,500 mg  1,500 mg Oral QHS Grant Fontana, [place]      . docusate sodium (COLACE) capsule 100 mg  100 mg Oral BID Grant Fontana, PA   100 mg at 10/17/11 0916  . ferrous sulfate tablet 325 mg  325 mg Oral BID WC Grant Fontana, [place]   325 mg at 10/17/11 7846  . glipiZIDE (GLUCOTROL) tablet 10 mg  10 mg Oral BID AC Grant Fontana, [place]   10 mg at 10/17/11 9629  . insulin aspart (novoLOG) injection 0-20 Units  0-20 Units Subcutaneous TID [facility] Grant Fontana, PA   3 Units at 10/17/11 [phone]  . levothyroxine (SYNTHROID, LEVOTHROID) tablet 88 mcg  88 mcg Oral Daily Grant Fontana, [place]   88 mcg at 10/17/11 0916  . linagliptin (TRADJENTA) tablet 5 mg  5 mg Oral Daily Grant Fontana, [place]   5 mg at 10/17/11 0916  . loratadine (CLARITIN) tablet 10 mg  10 mg Oral Daily Grant Fontana, [place]   10 mg at 10/17/11 0916  . LORazepam (ATIVAN) tablet 1 mg  1 mg Oral Q8H PRN Grant Fontana, PA   1 mg at 10/17/11 0925  . meloxicam (MOBIC) tablet 15 mg  15 mg Oral Daily Grant Fontana, [place]   15 mg at 10/17/11 0916  . metFORMIN (GLUCOPHAGE) tablet 500 mg  500 mg Oral BID WC Grant Fontana, PA   500 mg at 10/17/11 1324  . ondansetron (ZOFRAN) tablet 4 mg  4 mg Oral Q8H PRN Grant Fontana, PA      . pantoprazole (PROTONIX) EC tablet 40 mg  40 mg Oral Q1200 Grant Fontana, [place]   40 mg at 10/16/11 2247  . simvastatin (ZOCOR) tablet 40 mg  40 mg Oral QPM Grant Fontana, [place]      . vitamin E capsule 400 Units  400 Units Oral Daily Grant Fontana, [place]   400 Units at 10/17/11 0916  . zolpidem (AMBIEN) tablet 5 mg  5 mg Oral QHS PRN Grant Fontana, PA   5 mg at 10/16/11 2247  . DISCONTD: hydrochlorothiazide (MICROZIDE) capsule 12.5 mg  12.5 mg Oral Daily Donnetta Hutching, MD      . DISCONTD: lisinopril (PRINIVIL,ZESTRIL) tablet 10 mg  10 mg Oral Daily Donnetta Hutching, MD      . DISCONTD:  lisinopril-hydrochlorothiazide (PRINZIDE,ZESTORETIC) 10-12.5 MG per tablet 1 tablet  1 tablet Oral Daily Grant Fontana, [place]       Medications Prior to Admission  Medication Sig Dispense Refill  . haloperidol decanoate (HALDOL DECANOATE) 100 MG/ML injection Inject 100 mg into the muscle every 28 (twenty-eight) days.        OB/GYN Status:  No LMP recorded. Patient is postmenopausal.  General Assessment Data Location of Assessment: [facility] Living Arrangements: Group Home Can pt return to  current living arrangement?: Yes Admission Status: Voluntary Is patient capable of signing voluntary admission?: Yes Transfer from: Acute Hospital Referral Source: Self/Family/Friend  Education Status Is patient currently in school?: No  Risk to self Suicidal Ideation: No-Not Currently/Within Last 6 Months Suicidal Intent: No-Not Currently/Within Last 6 Months Is patient at risk for suicide?: Yes Suicidal Plan?: No Access to Means: No What has been your use of drugs/alcohol within the last 12 months?: pt reports hx of substance abuse- has been to rehab 4x Previous Attempts/Gestures: Yes How many times?: 2  (OD on Rx) Other Self Harm Risks: Yes Triggers for Past Attempts: Other (Comment) (SA, Depression) Intentional Self Injurious Behavior: Cutting;Burning Comment - Self Injurious Behavior: Hx of cutting/burning arms triggered by depression Family Suicide History: No Recent stressful life event(s): Other (Comment) (Thinking about brother's death 9 yrs ago, medical concerns) Persecutory voices/beliefs?: Yes Depression: Yes Depression Symptoms: Insomnia;Tearfulness;Isolating;Fatigue;Loss of interest in usual pleasures Substance abuse history and/or treatment for substance abuse?: Yes Suicide prevention information given to non-admitted patients: Not applicable  Risk to Others Homicidal Ideation: No-Not Currently/Within Last 6 Months Thoughts of Harm to Others: No-Not Currently Present/Within  Last 6 Months Current Homicidal Intent: No-Not Currently/Within Last 6 Months Current Homicidal Plan: No-Not Currently/Within Last 6 Months Access to Homicidal Means: No Identified Victim: n/a History of harm to others?: No Assessment of Violence: None Noted Violent Behavior Description: Cooperative, calm Does patient have access to weapons?: No Criminal Charges Pending?: No Does patient have a court date: No  Psychosis Hallucinations: Auditory;With command (to kill herself - devil wants to take her soul) Delusions: Unspecified (Devil is inside her head)  Mental Status Report Appear/Hygiene: Disheveled Eye Contact: Fair Motor Activity: Mannerisms (constantly licking lips or putting tounge on side of  mouth) Speech: Logical/coherent;Soft Level of Consciousness: Alert;Quiet/awake Mood: Depressed;Anxious Affect: Blunted;Depressed Anxiety Level: Minimal Thought Processes: Coherent;Relevant Judgement: Impaired Orientation: Person;Place;Time;Situation Obsessive Compulsive Thoughts/Behaviors: None  Cognitive Functioning Concentration: Decreased Memory: Recent Intact;Remote Intact IQ: Average Insight: Poor Impulse Control: Poor Appetite: Poor Weight Loss: 8  (over 2 mos) Weight Gain: 0  Sleep: Decreased Total Hours of Sleep: 4  Vegetative Symptoms: None  Prior Inpatient Therapy Prior Inpatient Therapy: Yes Prior Therapy Dates: 2008, 2012 Prior Therapy Facilty/Provider(s): Multiple admits to [place], [facility] Reason for Treatment: depression, SI  Prior Outpatient Therapy Prior Outpatient Therapy: Yes Prior Therapy Dates: 2012-2013 Prior Therapy Facilty/Provider(s): [facility] Reason for Treatment: depression, bipolar  ADL Screening (condition at time of admission) Patient's cognitive ability adequate to safely complete daily activities?: Yes Patient able to express need for assistance with ADLs?: Yes Independently performs ADLs?: Yes Weakness of Legs:  None Weakness of Arms/Hands: None  Home Assistive Devices/Equipment Home Assistive Devices/Equipment: None    Abuse/Neglect Assessment (Assessment to be complete while patient is alone) Physical Abuse: Denies Verbal Abuse: Denies Sexual Abuse: Denies Exploitation of patient/patient's resources: Denies Self-Neglect: Denies Values / Beliefs Cultural Requests During Hospitalization: None Spiritual Requests During Hospitalization: None   Advance Directives (For Healthcare) Advance Directive: Patient does not have advance directive;Patient would not like information Pre-existing out of facility DNR order (yellow form or pink MOST form): No    Additional Information 1:1 In Past 12 Months?: No CIRT Risk: No Elopement Risk: No Does patient have medical clearance?: Yes     Disposition:  Disposition Disposition of Patient: Inpatient treatment program;Referred to (Reviewing at [facility]) Type of inpatient treatment program: Adult  On Site Evaluation by:   Reviewed with Physician:  Spoke with EDP (Dr. Denton Lank) this  AM regarding pt. EDP requesting for [facility] to re-review the information & consider. Stated he has reviewed her chart and the low sodium levels are consistent for this pt. He did discontinue the diuretics which at one point was discontinued. Info re-sent to [facility] for review.   Romeo Apple 10/17/2011 9:43 AM

## 2012-01-27 ENCOUNTER — Emergency Department (INDEPENDENT_AMBULATORY_CARE_PROVIDER_SITE_OTHER): Payer: Medicare Other

## 2012-01-27 ENCOUNTER — Emergency Department (INDEPENDENT_AMBULATORY_CARE_PROVIDER_SITE_OTHER)
Admission: EM | Admit: 2012-01-27 | Discharge: 2012-01-27 | Disposition: A | Discharge status: Home Health | Payer: Medicaid Other | Source: Home / Self Care

## 2012-01-27 ENCOUNTER — Encounter (HOSPITAL_COMMUNITY): Payer: Self-pay

## 2012-01-27 DIAGNOSIS — S60219A Contusion of unspecified wrist, initial encounter: Secondary | ICD-10-CM

## 2012-01-27 NOTE — ED Provider Notes (Signed)
History     CSN: [id number]  Arrival date & time 01/27/12  1428   None     Chief Complaint  Patient presents with  . Wrist Pain    (Consider location/radiation/quality/duration/timing/severity/associated sxs/prior treatment) HPI Comments: Pt lives at care facility.  Tried to get off of toilet by self today around 1:30, became dizzy and fell sideways onto L wrist.  Apparently has been having dizziness lately with psychiatric medication- pt, family, psychiatrist aware, and monitoring situation.  Pt is supposed to ask for help when getting up or walking, but forgot. Pt denies pain anywhere else, denies hitting head or LOC.   Patient is a 56 y.o. female presenting with wrist pain. The history is provided by the patient and a relative.  Wrist Pain This is a new problem. The current episode started 1 to 2 hours ago. The problem occurs constantly. The problem has not changed since onset.The symptoms are aggravated by nothing. She has tried nothing for the symptoms.    Past Medical History  Diagnosis Date  . Diabetes mellitus   . GERD (gastroesophageal reflux disease)   . Borderline personality disorder   . Schizoaffective disorder   . Depression   . Hyperlipidemia   . Chronic back pain   . Hypothyroidism     Past Surgical History  Procedure Date  . Appendectomy   . Knee surgery     right  . Cholecystectomy     History reviewed. No pertinent family history.  History  Substance Use Topics  . Smoking status: Former Games developer  . Smokeless tobacco: Not on file  . Alcohol Use: No    OB History    Grav Para Term Preterm Abortions TAB SAB Ect Mult Living                  Review of Systems  Musculoskeletal:       Wrist pain  Skin: Positive for color change.       Bruise on L wrist    Allergies  Review of patient's allergies indicates no known allergies.  Home Medications   Current Outpatient Rx  Name Route Sig Dispense Refill  . ASPIRIN EC 81 MG PO TBEC Oral Take  81 mg by mouth daily.    Marland Kitchen BENZTROPINE MESYLATE 2 MG PO TABS Oral Take 2 mg by mouth 2 (two) times daily.    Marland Kitchen CITALOPRAM HYDROBROMIDE 40 MG PO TABS Oral Take 40 mg by mouth at bedtime.    . CYCLOBENZAPRINE HCL 10 MG PO TABS Oral Take 10 mg by mouth 3 (three) times daily as needed. spasms    . DIVALPROEX SODIUM ER 500 MG PO TB24 Oral Take 1,500 mg by mouth at bedtime.    Marland Kitchen DOCUSATE SODIUM 100 MG PO CAPS Oral Take 100 mg by mouth 2 (two) times daily.    Marland Kitchen FERROUS SULFATE 325 (65 FE) MG PO TABS Oral Take 325 mg by mouth 2 (two) times daily with a meal.    . GLIPIZIDE 10 MG PO TABS Oral Take 10 mg by mouth 2 (two) times daily before a meal.    . GLUCOSE BLOOD VI STRP Other 1 each by Other route as needed. Use as instructed    . HALOPERIDOL DECANOATE 100 MG/ML IM SOLN Intramuscular Inject 100 mg into the muscle every 28 (twenty-eight) days.    Marland Kitchen LEVOTHYROXINE SODIUM 88 MCG PO TABS Oral Take 88 mcg by mouth daily.    Marland Kitchen LISINOPRIL-HYDROCHLOROTHIAZIDE 10-12.5 MG PO TABS Oral  Take 1 tablet by mouth daily.    Marland Kitchen LORATADINE 10 MG PO TABS Oral Take 10 mg by mouth daily.    . MELOXICAM 15 MG PO TABS Oral Take 15 mg by mouth daily.    Marland Kitchen METFORMIN HCL 500 MG PO TABS Oral Take 500 mg by mouth 2 (two) times daily with a meal.    . OMEPRAZOLE 20 MG PO CPDR Oral Take 20 mg by mouth daily.    Marland Kitchen SIMVASTATIN 40 MG PO TABS Oral Take 40 mg by mouth every evening.    Marland Kitchen SITAGLIPTIN PHOSPHATE 50 MG PO TABS Oral Take 50 mg by mouth daily.    Marland Kitchen VITAMIN E 400 UNITS PO CAPS Oral Take 400 Units by mouth daily.      BP 111/68  Pulse 90  Temp(Src) 98.3 F (36.8 C) (Oral)  Resp 18  SpO2 98%  Physical Exam  Constitutional: She is oriented to person, place, and time. She appears well-developed and well-nourished. No distress.  Pulmonary/Chest: Effort normal.  Musculoskeletal:       Left wrist: She exhibits tenderness, bony tenderness and swelling. She exhibits normal range of motion.  Neurological: She is alert and  oriented to person, place, and time.       Appears to be having involuntary movements of tongue and head  Skin: Skin is warm and dry. Bruising noted.       Bruising dorsal L wrist area    ED Course  Procedures (including critical care time)  Labs Reviewed - No data to display Dg Wrist Complete Left  01/27/2012  *RADIOLOGY REPORT*  Clinical Data: Fall with left wrist injury and pain.  LEFT WRIST - COMPLETE 3+ VIEW  Comparison:  None.  Findings:  There is no evidence of fracture or dislocation.  There is no evidence of arthropathy or other focal bone abnormality. Soft tissues are unremarkable.  IMPRESSION: Negative.  Original Report Authenticated By: Reola Calkins, M.D.     1. Wrist contusion       MDM  Discussed tongue and head movements with pt and caregiver.  This is a known problem to pt, family and psychiatrist related to her schizo-affective disorder medication and they are working on adjusting meds to fix it. These are not new sx or issues.         Cathlyn Parsons, NP 01/27/12 1952

## 2012-01-27 NOTE — ED Notes (Signed)
Pt c/o L wrist pain following fall approx 130pm.  Pt states it is more painful to touch than movement.  Cap refil <2secs distally.  Pt able to move digits distally.

## 2012-01-27 NOTE — Discharge Instructions (Signed)
Try ice packs to help the swelling in your wrist.  Also, if you are able to soak your wrist in warm water with epsom salt at least once a day, it would help the pain and swelling significantly.    Contusion A contusion is a deep bruise. Contusions happen when an injury causes bleeding under the skin. Signs of bruising include pain, puffiness (swelling), and discolored skin. The contusion may turn blue, purple, or yellow. HOME CARE   Put ice on the injured area.   Put ice in a plastic bag.   Place a towel between your skin and the bag.   Leave the ice on for 15 to 20 minutes, 3 to 4 times a day.   Only take medicine as told by your doctor.   Rest the injured area.   If possible, raise (elevate) the injured area to lessen puffiness.  GET HELP RIGHT AWAY IF:   You have more bruising or puffiness.   You have pain that is getting worse.   Your puffiness or pain is not helped by medicine.  MAKE SURE YOU:   Understand these instructions.   Will watch your condition.   Will get help right away if you are not doing well or get worse.  Document Released: 02/20/2008 Document Revised: 08/23/2011 Document Reviewed: 07/09/2011 [facility] Patient Information 2012 [place], [place].

## 2012-02-04 NOTE — ED Provider Notes (Signed)
Medical screening examination/treatment/procedure(s) were performed by resident physician or non-physician practitioner and as supervising physician I was immediately available for consultation/collaboration.   Barkley Bruns MD.    Linna Hoff, MD 02/04/12 [phone]

## 2012-04-29 ENCOUNTER — Encounter (HOSPITAL_COMMUNITY): Payer: Self-pay | Admitting: Emergency Medicine

## 2012-04-29 ENCOUNTER — Inpatient Hospital Stay (HOSPITAL_COMMUNITY): Payer: Medicare Other

## 2012-04-29 ENCOUNTER — Inpatient Hospital Stay (HOSPITAL_COMMUNITY)
Admission: EM | Admit: 2012-04-29 | Discharge: 2012-05-03 | DRG: 641 | Disposition: A | Discharge status: Other Acute Inpatient Hospital | Payer: Medicare Other | Attending: Internal Medicine | Admitting: Internal Medicine

## 2012-04-29 ENCOUNTER — Observation Stay (HOSPITAL_COMMUNITY): Payer: Medicare Other

## 2012-04-29 DIAGNOSIS — E119 Type 2 diabetes mellitus without complications: Secondary | ICD-10-CM

## 2012-04-29 DIAGNOSIS — F209 Schizophrenia, unspecified: Secondary | ICD-10-CM

## 2012-04-29 DIAGNOSIS — D649 Anemia, unspecified: Secondary | ICD-10-CM

## 2012-04-29 DIAGNOSIS — E871 Hypo-osmolality and hyponatremia: Secondary | ICD-10-CM

## 2012-04-29 DIAGNOSIS — E785 Hyperlipidemia, unspecified: Secondary | ICD-10-CM

## 2012-04-29 DIAGNOSIS — K219 Gastro-esophageal reflux disease without esophagitis: Secondary | ICD-10-CM

## 2012-04-29 DIAGNOSIS — E878 Other disorders of electrolyte and fluid balance, not elsewhere classified: Secondary | ICD-10-CM | POA: Diagnosis present

## 2012-04-29 DIAGNOSIS — R45851 Suicidal ideations: Secondary | ICD-10-CM

## 2012-04-29 DIAGNOSIS — Z87891 Personal history of nicotine dependence: Secondary | ICD-10-CM

## 2012-04-29 DIAGNOSIS — I10 Essential (primary) hypertension: Secondary | ICD-10-CM

## 2012-04-29 DIAGNOSIS — G8929 Other chronic pain: Secondary | ICD-10-CM

## 2012-04-29 DIAGNOSIS — F259 Schizoaffective disorder, unspecified: Secondary | ICD-10-CM

## 2012-04-29 DIAGNOSIS — F29 Unspecified psychosis not due to a substance or known physiological condition: Secondary | ICD-10-CM

## 2012-04-29 DIAGNOSIS — F329 Major depressive disorder, single episode, unspecified: Secondary | ICD-10-CM

## 2012-04-29 DIAGNOSIS — M549 Dorsalgia, unspecified: Secondary | ICD-10-CM

## 2012-04-29 DIAGNOSIS — E1169 Type 2 diabetes mellitus with other specified complication: Secondary | ICD-10-CM | POA: Diagnosis present

## 2012-04-29 DIAGNOSIS — F32A Depression, unspecified: Secondary | ICD-10-CM

## 2012-04-29 DIAGNOSIS — F603 Borderline personality disorder: Secondary | ICD-10-CM

## 2012-04-29 DIAGNOSIS — E039 Hypothyroidism, unspecified: Secondary | ICD-10-CM

## 2012-04-29 DIAGNOSIS — Z9089 Acquired absence of other organs: Secondary | ICD-10-CM

## 2012-04-29 DIAGNOSIS — F319 Bipolar disorder, unspecified: Secondary | ICD-10-CM

## 2012-04-29 HISTORY — DX: Schizoaffective disorder, unspecified: F25.9

## 2012-04-29 HISTORY — DX: Bipolar disorder, unspecified: F31.9

## 2012-04-29 HISTORY — DX: Essential (primary) hypertension: I10

## 2012-04-29 HISTORY — DX: Type 2 diabetes mellitus without complications: E11.9

## 2012-04-29 HISTORY — DX: Schizophrenia, unspecified: F20.9

## 2012-04-29 LAB — CBC
HCT: 34.7 % — ABNORMAL LOW (ref 36.0–46.0)
Hemoglobin: 12 g/dL (ref 12.0–15.0)
MCH: 30 pg (ref 26.0–34.0)
MCHC: 34.6 g/dL (ref 30.0–36.0)
MCV: 86.8 fL (ref 78.0–100.0)
Platelets: 280 10*3/uL (ref 150–400)
RBC: 4 MIL/uL (ref 3.87–5.11)
RDW: 13.5 % (ref 11.5–15.5)
WBC: 7.4 10*3/uL (ref 4.0–10.5)

## 2012-04-29 LAB — RAPID URINE DRUG SCREEN, HOSP PERFORMED
Amphetamines: NOT DETECTED
Barbiturates: NOT DETECTED
Benzodiazepines: NOT DETECTED
Cocaine: NOT DETECTED
Opiates: NOT DETECTED
Tetrahydrocannabinol: NOT DETECTED

## 2012-04-29 LAB — COMPREHENSIVE METABOLIC PANEL
ALT: 5 U/L (ref 0–35)
AST: 11 U/L (ref 0–37)
Albumin: 3.6 g/dL (ref 3.5–5.2)
Alkaline Phosphatase: 34 U/L — ABNORMAL LOW (ref 39–117)
BUN: 15 mg/dL (ref 6–23)
CO2: 26 mEq/L (ref 19–32)
Calcium: 9.3 mg/dL (ref 8.4–10.5)
Chloride: 85 mEq/L — ABNORMAL LOW (ref 96–112)
Creatinine, Ser: 1.05 mg/dL (ref 0.50–1.10)
GFR calc Af Amer: 68 mL/min — ABNORMAL LOW (ref >90)
GFR calc non Af Amer: 59 mL/min — ABNORMAL LOW (ref >90)
Glucose, Bld: 114 mg/dL — ABNORMAL HIGH (ref 70–99)
Potassium: 4.2 mEq/L (ref 3.5–5.1)
Sodium: 122 mEq/L — ABNORMAL LOW (ref 135–145)
Total Bilirubin: 0.2 mg/dL — ABNORMAL LOW (ref 0.3–1.2)
Total Protein: 7.3 g/dL (ref 6.0–8.3)

## 2012-04-29 LAB — GLUCOSE, CAPILLARY
Glucose-Capillary: 118 mg/dL — ABNORMAL HIGH (ref 70–99)
Glucose-Capillary: 183 mg/dL — ABNORMAL HIGH (ref 70–99)

## 2012-04-29 LAB — URINALYSIS, ROUTINE W REFLEX MICROSCOPIC
Bilirubin Urine: NEGATIVE
Glucose, UA: NEGATIVE mg/dL
Hgb urine dipstick: NEGATIVE
Ketones, ur: NEGATIVE mg/dL
Leukocytes, UA: NEGATIVE
Nitrite: NEGATIVE
Protein, ur: NEGATIVE mg/dL
Specific Gravity, Urine: 1.011 (ref 1.005–1.030)
Urobilinogen, UA: 1 mg/dL (ref 0.0–1.0)
pH: 6.5 (ref 5.0–8.0)

## 2012-04-29 LAB — ETHANOL: Alcohol, Ethyl (B): <11 mg/dL (ref 0–11)

## 2012-04-29 LAB — MAGNESIUM: Magnesium: 1.6 mg/dL (ref 1.5–2.5)

## 2012-04-29 MED ORDER — SODIUM CHLORIDE 0.9 % IV BOLUS (SEPSIS)
1000.0000 mL | Freq: Once | INTRAVENOUS | Status: AC
  Administered 2012-04-29: 1000 mL via INTRAVENOUS

## 2012-04-29 MED ORDER — SODIUM CHLORIDE 0.9 % IV SOLN
INTRAVENOUS | Status: AC
  Administered 2012-04-29: 17:00:00 via INTRAVENOUS

## 2012-04-29 MED ORDER — LINAGLIPTIN 5 MG PO TABS
5.0000 mg | ORAL_TABLET | Freq: Every day | ORAL | Status: DC
  Administered 2012-04-30 – 2012-05-02 (×3): 5 mg via ORAL
  Filled 2012-04-29 (×3): qty 1

## 2012-04-29 MED ORDER — PANTOPRAZOLE SODIUM 40 MG PO TBEC
40.0000 mg | DELAYED_RELEASE_TABLET | Freq: Every day | ORAL | Status: DC
  Administered 2012-04-30 – 2012-05-02 (×3): 40 mg via ORAL
  Filled 2012-04-29 (×4): qty 1

## 2012-04-29 MED ORDER — SODIUM CHLORIDE 0.9 % IV SOLN
INTRAVENOUS | Status: DC
  Administered 2012-04-30 (×2): via INTRAVENOUS
  Administered 2012-04-30: 100 mL/h via INTRAVENOUS
  Administered 2012-05-01 (×2): via INTRAVENOUS

## 2012-04-29 MED ORDER — ENOXAPARIN SODIUM 40 MG/0.4ML ~~LOC~~ SOLN
40.0000 mg | SUBCUTANEOUS | Status: DC
  Administered 2012-04-29 – 2012-05-02 (×4): 40 mg via SUBCUTANEOUS
  Filled 2012-04-29 (×4): qty 0.4

## 2012-04-29 MED ORDER — ACETAMINOPHEN 325 MG PO TABS
650.0000 mg | ORAL_TABLET | Freq: Four times a day (QID) | ORAL | Status: DC | PRN
  Administered 2012-04-30: 650 mg via ORAL
  Filled 2012-04-29: qty 2

## 2012-04-29 MED ORDER — VITAMIN E 180 MG (400 UNIT) PO CAPS
400.0000 [IU] | ORAL_CAPSULE | Freq: Every day | ORAL | Status: DC
  Administered 2012-04-30 – 2012-05-02 (×3): 400 [IU] via ORAL
  Filled 2012-04-29 (×3): qty 1

## 2012-04-29 MED ORDER — OXYCODONE HCL 5 MG PO TABS
5.0000 mg | ORAL_TABLET | ORAL | Status: DC | PRN
  Administered 2012-05-01: 5 mg via ORAL
  Filled 2012-04-29 (×2): qty 1

## 2012-04-29 MED ORDER — LEVOTHYROXINE SODIUM 88 MCG PO TABS
88.0000 ug | ORAL_TABLET | Freq: Every day | ORAL | Status: DC
  Administered 2012-04-30 – 2012-05-02 (×3): 88 ug via ORAL
  Filled 2012-04-29 (×6): qty 1

## 2012-04-29 MED ORDER — PALIPERIDONE ER 6 MG PO TB24
6.0000 mg | ORAL_TABLET | Freq: Every day | ORAL | Status: DC
  Administered 2012-04-29 – 2012-05-02 (×4): 6 mg via ORAL
  Filled 2012-04-29 (×4): qty 1

## 2012-04-29 MED ORDER — ALUM & MAG HYDROXIDE-SIMETH 200-200-20 MG/5ML PO SUSP: 30.0000 mL | Freq: Four times a day (QID) | ORAL | Status: DC | PRN

## 2012-04-29 MED ORDER — ZOLPIDEM TARTRATE 10 MG PO TABS
10.0000 mg | ORAL_TABLET | Freq: Every day | ORAL | Status: DC
  Administered 2012-04-29 – 2012-05-01 (×3): 10 mg via ORAL
  Filled 2012-04-29 (×3): qty 1

## 2012-04-29 MED ORDER — BENZTROPINE MESYLATE 2 MG PO TABS
2.0000 mg | ORAL_TABLET | Freq: Two times a day (BID) | ORAL | Status: DC
  Administered 2012-04-29 – 2012-05-02 (×7): 2 mg via ORAL
  Filled 2012-04-29 (×7): qty 1

## 2012-04-29 MED ORDER — MAGNESIUM SULFATE 40 MG/ML IJ SOLN
2.0000 g | Freq: Once | INTRAMUSCULAR | Status: AC
  Administered 2012-04-29: 2 g via INTRAVENOUS
  Filled 2012-04-29: qty 50

## 2012-04-29 MED ORDER — SODIUM CHLORIDE 0.9 % IJ SOLN
3.0000 mL | Freq: Two times a day (BID) | INTRAMUSCULAR | Status: DC
  Administered 2012-04-29 – 2012-05-02 (×6): 3 mL via INTRAVENOUS

## 2012-04-29 MED ORDER — AMLODIPINE BESYLATE 5 MG PO TABS
5.0000 mg | ORAL_TABLET | Freq: Every day | ORAL | Status: DC
  Administered 2012-04-30: 5 mg via ORAL
  Filled 2012-04-29: qty 1

## 2012-04-29 MED ORDER — DIVALPROEX SODIUM ER 500 MG PO TB24
1500.0000 mg | ORAL_TABLET | Freq: Every day | ORAL | Status: DC
  Administered 2012-04-29 – 2012-05-02 (×4): 1500 mg via ORAL
  Filled 2012-04-29 (×4): qty 3

## 2012-04-29 MED ORDER — MELOXICAM 15 MG PO TABS
15.0000 mg | ORAL_TABLET | Freq: Two times a day (BID) | ORAL | Status: DC
  Administered 2012-04-29 – 2012-05-02 (×7): 15 mg via ORAL
  Filled 2012-04-29 (×7): qty 1

## 2012-04-29 MED ORDER — DOCUSATE SODIUM 100 MG PO CAPS
100.0000 mg | ORAL_CAPSULE | Freq: Two times a day (BID) | ORAL | Status: DC
  Administered 2012-04-29 – 2012-05-02 (×7): 100 mg via ORAL
  Filled 2012-04-29 (×7): qty 1

## 2012-04-29 MED ORDER — OXCARBAZEPINE 300 MG PO TABS
300.0000 mg | ORAL_TABLET | Freq: Every day | ORAL | Status: DC
  Administered 2012-04-29 – 2012-05-02 (×4): 300 mg via ORAL
  Filled 2012-04-29 (×4): qty 1

## 2012-04-29 MED ORDER — INSULIN ASPART 100 UNIT/ML ~~LOC~~ SOLN
0.0000 [IU] | Freq: Three times a day (TID) | SUBCUTANEOUS | Status: DC
  Administered 2012-05-01: 18:00:00 via SUBCUTANEOUS
  Administered 2012-05-02: 3 [IU] via SUBCUTANEOUS

## 2012-04-29 MED ORDER — ONDANSETRON HCL 4 MG/2ML IJ SOLN: 4.0000 mg | Freq: Four times a day (QID) | INTRAMUSCULAR | Status: DC | PRN

## 2012-04-29 MED ORDER — ASPIRIN EC 81 MG PO TBEC
81.0000 mg | DELAYED_RELEASE_TABLET | Freq: Every day | ORAL | Status: DC
  Administered 2012-04-30 – 2012-05-02 (×3): 81 mg via ORAL
  Filled 2012-04-29 (×3): qty 1

## 2012-04-29 MED ORDER — ONDANSETRON HCL 4 MG PO TABS: 4.0000 mg | ORAL_TABLET | Freq: Four times a day (QID) | ORAL | Status: DC | PRN

## 2012-04-29 MED ORDER — ACETAMINOPHEN 650 MG RE SUPP: 650.0000 mg | Freq: Four times a day (QID) | RECTAL | Status: DC | PRN

## 2012-04-29 NOTE — ED Provider Notes (Signed)
History     CSN: [id number]  Arrival date & time 04/29/12  1038   First MD Initiated Contact with Patient 04/29/12 1107      Chief Complaint  Patient presents with  . Suicidal  . Medical Clearance    (Consider location/radiation/quality/duration/timing/severity/associated sxs/prior treatment) The history is provided by the patient.  pt with hx schizophrenia w thoughts of harm to self or others in past week. States has had similar thoughts recurrently since brothers death approximately 9 yrs ago. Despite these chronic recurrent thoughts, states has never attempted to hurt self or others. States feeling of depression come and go, stating at times feels tearful, and at other times feels at baseline. No recent unusual stressor, other than recent anniversary of brothers death. Lives at a mental health group home and states generally gets along fine with the staff and residents. States compliant w meds, denies any recent change in meds. States recent health at baseline.      Past Medical History  Diagnosis Date  . Diabetes mellitus   . GERD (gastroesophageal reflux disease)   . Borderline personality disorder   . Schizoaffective disorder   . Depression   . Hyperlipidemia   . Chronic back pain   . Hypothyroidism   . Schizophrenia     Past Surgical History  Procedure Date  . Appendectomy   . Knee surgery     right  . Cholecystectomy     History reviewed. No pertinent family history.  History  Substance Use Topics  . Smoking status: Former Games developer  . Smokeless tobacco: Not on file  . Alcohol Use: No    OB History    Grav Para Term Preterm Abortions TAB SAB Ect Mult Living                  Review of Systems  Constitutional: Negative for fever.  HENT: Negative for neck pain.   Eyes: Negative for visual disturbance.  Respiratory: Negative for shortness of breath.   Cardiovascular: Negative for chest pain.  Gastrointestinal: Negative for abdominal pain.    Genitourinary: Negative for flank pain.  Musculoskeletal: Negative for back pain.  Skin: Negative for rash.  Neurological: Negative for headaches.  Hematological: Does not bruise/bleed easily.  Psychiatric/Behavioral: Positive for dysphoric mood.    Allergies  Review of patient's allergies indicates no known allergies.  Home Medications   Current Outpatient Rx  Name Route Sig Dispense Refill  . ASPIRIN EC 81 MG PO TBEC Oral Take 81 mg by mouth daily.    Marland Kitchen BENZTROPINE MESYLATE 2 MG PO TABS Oral Take 2 mg by mouth 2 (two) times daily.    Marland Kitchen CITALOPRAM HYDROBROMIDE 40 MG PO TABS Oral Take 40 mg by mouth daily.     Marland Kitchen DIVALPROEX SODIUM ER 500 MG PO TB24 Oral Take 1,500 mg by mouth at bedtime.    Marland Kitchen DOCUSATE SODIUM 100 MG PO CAPS Oral Take 100 mg by mouth 2 (two) times daily.    Marland Kitchen GLIPIZIDE 10 MG PO TABS Oral Take 10 mg by mouth 2 (two) times daily before a meal.    . GLUCOSE BLOOD VI STRP Other 1 each by Other route as needed. Use as instructed    . LISINOPRIL-HYDROCHLOROTHIAZIDE 10-12.5 MG PO TABS Oral Take 1 tablet by mouth daily.    . MELOXICAM 15 MG PO TABS Oral Take 15 mg by mouth 2 (two) times daily.     Marland Kitchen METFORMIN HCL 500 MG PO TABS Oral Take 500 mg  by mouth 2 (two) times daily with a meal.    . OMEPRAZOLE 20 MG PO CPDR Oral Take 40 mg by mouth daily.     Marland Kitchen OXCARBAZEPINE 300 MG PO TABS Oral Take 300 mg by mouth at bedtime.    Marland Kitchen PALIPERIDONE ER 6 MG PO TB24 Oral Take 6 mg by mouth at bedtime.    Marland Kitchen PALIPERIDONE PALMITATE 117 MG/0.75ML IM SUSP Intramuscular Inject 117 mg into the muscle See admin instructions. Every 4 weeks    . POLYETHYLENE GLYCOL 3350 PO PACK Oral Take 17 g by mouth daily.    Marland Kitchen SIMVASTATIN 40 MG PO TABS Oral Take 40 mg by mouth every evening.    Marland Kitchen SITAGLIPTIN PHOSPHATE 50 MG PO TABS Oral Take 50 mg by mouth daily.    Marland Kitchen VITAMIN E 400 UNITS PO CAPS Oral Take 400 Units by mouth daily.    Marland Kitchen ZOLPIDEM TARTRATE 10 MG PO TABS Oral Take 10 mg by mouth at bedtime.      BP  148/80  Pulse 105  Temp 98.4 F (36.9 C) (Oral)  Resp 18  SpO2 99%  Physical Exam  Nursing note and vitals reviewed. Constitutional: She is oriented to person, place, and time. She appears well-developed and well-nourished. No distress.  HENT:  Mouth/Throat: Oropharynx is clear and moist.  Eyes: Conjunctivae are normal. No scleral icterus.  Neck: Neck supple. No tracheal deviation present.  Cardiovascular: Normal rate, regular rhythm, normal heart sounds and intact distal pulses.   Pulmonary/Chest: Effort normal and breath sounds normal. No respiratory distress.  Abdominal: Soft. Normal appearance. She exhibits no distension. There is no tenderness.  Musculoskeletal: She exhibits no edema.  Neurological: She is alert and oriented to person, place, and time.  Skin: Skin is warm and dry. No rash noted.  Psychiatric: She has a normal mood and affect.       States occasionally hears voices, not currently. Notes occasional thoughts of harm to self, others, not currently experiencing.     ED Course  Procedures (including critical care time)  Labs Reviewed  GLUCOSE, CAPILLARY - Abnormal; Notable for the following:    Glucose-Capillary 118 (*)     All other components within normal limits   Results for orders placed during the hospital encounter of 04/29/12  GLUCOSE, CAPILLARY      Component Value Range   Glucose-Capillary 118 (*) 70 - 99 mg/dL  CBC      Component Value Range   WBC 7.4  4.0 - 10.5 K/uL   RBC 4.00  3.87 - 5.11 MIL/uL   Hemoglobin 12.0  12.0 - 15.0 g/dL   HCT 16.1 (*) 09.6 - 04.5 %   MCV 86.8  78.0 - 100.0 fL   MCH 30.0  26.0 - 34.0 pg   MCHC 34.6  30.0 - 36.0 g/dL   RDW 40.9  81.1 - 91.4 %   Platelets 280  150 - 400 K/uL  COMPREHENSIVE METABOLIC PANEL      Component Value Range   Sodium 122 (*) 135 - 145 mEq/L   Potassium 4.2  3.5 - 5.1 mEq/L   Chloride 85 (*) 96 - 112 mEq/L   CO2 26  19 - 32 mEq/L   Glucose, Bld 114 (*) 70 - 99 mg/dL   BUN 15  6 - 23  mg/dL   Creatinine, Ser 7.82  0.50 - 1.10 mg/dL   Calcium 9.3  8.4 - 95.6 mg/dL   Total Protein 7.3  6.0 - 8.3  g/dL   Albumin 3.6  3.5 - 5.2 g/dL   AST 11  0 - 37 U/L   ALT 5  0 - 35 U/L   Alkaline Phosphatase 34 (*) 39 - 117 U/L   Total Bilirubin 0.2 (*) 0.3 - 1.2 mg/dL   GFR calc non Af Amer 59 (*) >90 mL/min   GFR calc Af Amer 68 (*) >90 mL/min  ETHANOL      Component Value Range   Alcohol, Ethyl (B) <11  0 - 11 mg/dL       MDM  Labs. telepsych consult.   1 liter ns iv.   Given na 122, ?contribution to mental status issues, also will require med tx, stabilization, improvement in na prior to any psychiatric facility considering for admission - as such, triad called to admit.  [facility]ad indicates admit to team 8, tele bed.       Suzi Roots, MD 04/29/12 1355

## 2012-04-29 NOTE — ED Notes (Signed)
Pt has been unable to urinate so far.  Waiting on urine sample.

## 2012-04-29 NOTE — ED Notes (Signed)
Sent from [facility] for SI/HI- states having evil thoughts- started yesterday- " the anniversary of brother's death- voices are telling me to hurt myself, cut and burn myself, telling me to have sexual intercourse with God--I have had thoughts- God would kill me if he knew. Also have thoughts of torturing and killing my family, "   "I have heard voices in roommate's closet".

## 2012-04-29 NOTE — ED Notes (Signed)
Pt

## 2012-04-29 NOTE — ED Notes (Signed)
[id number]<AV> Expected date:<BR> Expected time:<BR> Means of arrival:<BR> Comments:<BR>

## 2012-04-29 NOTE — ED Notes (Addendum)
Unable to complete telepsych in ED.  Talked to Dr. Denton Lank.  Will inform accepting RN of need for psych consult

## 2012-04-29 NOTE — Progress Notes (Signed)
[facility] ED Cm went to see pt in ED triage CON rm who stated she was "going to the bathroom".  CM assisted her to TR bathroom using her cane.  Pt attempted to void but unable to do so.  Pt requested "walker" Cm obtained walker to assist pt back to [facility] ED CON room

## 2012-04-29 NOTE — Progress Notes (Addendum)
Disposition Note  Laketa Sandoz, is a 56 y.o. female,   MRN: [id number]  -  DOB - 06/14/1956  Outpatient Primary MD for the patient is AVBUERE,EDWIN A, MD   Blood pressure 148/80, pulse 105, temperature 98.4 F (36.9 C), temperature source Oral, resp. rate 18, SpO2 99.00%.  Active Problems:  Hyponatremia  Chloride, decreased level  Passive suicidal ideations  GERD (gastroesophageal reflux disease)  Depression  Hyperlipidemia  Chronic back pain  Hypothyroidism    56 yo female with extensive psychiatric history [facility]  Home reports SI/HI.  Reportedly she has trouble near the anniversary of her brother's death hearing voices to hurt/cut/burn herself.  She reports thoughts of killing her family and hearing voices in her roommate's closet.  She has never attempted to actually hurt herself or others.  In the ED she is pleasant but has tremor in both hands.  She has mild lower extremity edema left > Right.  She admits to confusion particularly over the last few days.  Her NA is 122.  Chloride is 85.   I have ordered stat urine creatinine, sodium and osmol that will hopefully be collected before her IVF bolus.  I will request a tele bed as the patient appears very stable and labs are currently stable.  I have called in for psychiatric evaluation of the patient and her medications.  Algis Downs, PA-C [facility] Pager: [phone]

## 2012-04-29 NOTE — ED Notes (Signed)
Pt connected to monitor.

## 2012-04-29 NOTE — H&P (Signed)
[name] MRN: [id number] DOB/AGE: 18-Jan-1956 56 y.o. Primary Care Physician:AVBUERE,EDWIN A, MD Admit date: 04/29/2012 Chief Complaint: Suicidal ideations/hyponatremia HPI:  [name] is a 55 year old Caucasian female with a history of depression, bipolar disorder, diabetes, schizophrenia, hypothyroidism, hypertension, history of chronic hyponatremia with baseline sodium of 126 - 131 who presents from mental health group home with suicidal ideations, homicidal ideations, and auditory hallucinations. Patient denies any fevers, no chills, no chest pain, no shortness of breath, no nausea, no vomiting, no cough, no constipation, no dysuria, no weakness, no focal neurological symptoms, patient does endorse some diarrhea after being started on MiraLAX. Patient was seen in the ED labs obtained showed that patient did have a sodium of 122 will call to admit the patient for medical stabilization. Patient does endorse hearing voices in the closet she states that the voices are garbled she also states that she's been having thoughts of sexual fantasies against God and also bad thoughts of killing herself and other people which are mostly due to towards her family. Patient states that this is close to the anniversary of when her brother died in 03/12/03 and has had similar thoughts in the past. Patient was sent from the group home to the ED secondary to her suicidal or homicidal ideations.  Past Medical History  Diagnosis Date  . Diabetes mellitus   . GERD (gastroesophageal reflux disease)   . Borderline personality disorder   . Schizoaffective disorder   . Depression   . Hyperlipidemia   . Chronic back pain   . Hypothyroidism   . Schizophrenia   . Schizoaffective disorder 04/29/2012  . HTN (hypertension) 04/29/2012  . Bipolar affective disorder 04/29/2012  . Diabetes mellitus 04/29/2012    Past Surgical History  Procedure Date  . Appendectomy   . Knee surgery     right  . Cholecystectomy      Prior to Admission medications   Medication Sig Start Date End Date Taking? Authorizing Provider  aspirin EC 81 MG tablet Take 81 mg by mouth daily.   Yes Historical Provider, MD  benztropine (COGENTIN) 2 MG tablet Take 2 mg by mouth 2 (two) times daily.   Yes Historical Provider, MD  citalopram (CELEXA) 40 MG tablet Take 40 mg by mouth daily.    Yes Historical Provider, MD  divalproex (DEPAKOTE ER) 500 MG 24 hr tablet Take 1,500 mg by mouth at bedtime.   Yes Historical Provider, MD  docusate sodium (COLACE) 100 MG capsule Take 100 mg by mouth 2 (two) times daily.   Yes Historical Provider, MD  glipiZIDE (GLUCOTROL) 10 MG tablet Take 10 mg by mouth 2 (two) times daily before a meal.   Yes Historical Provider, MD  glucose blood test strip 1 each by Other route as needed. Use as instructed   Yes Historical Provider, MD  lisinopril-hydrochlorothiazide (PRINZIDE,ZESTORETIC) 10-12.5 MG per tablet Take 1 tablet by mouth daily.   Yes Historical Provider, MD  meloxicam (MOBIC) 15 MG tablet Take 15 mg by mouth 2 (two) times daily.    Yes Historical Provider, MD  metFORMIN (GLUCOPHAGE) 500 MG tablet Take 500 mg by mouth 2 (two) times daily with a meal.   Yes Historical Provider, MD  omeprazole (PRILOSEC) 20 MG capsule Take 40 mg by mouth daily.    Yes Historical Provider, MD  Oxcarbazepine (TRILEPTAL) 300 MG tablet Take 300 mg by mouth at bedtime.   Yes Historical Provider, MD  paliperidone (INVEGA) 6 MG 24 hr tablet Take 6 mg by mouth  at bedtime.   Yes Historical Provider, MD  Paliperidone Palmitate (INVEGA SUSTENNA) 117 MG/0.75ML SUSP Inject 117 mg into the muscle See admin instructions. Every 4 weeks   Yes Historical Provider, MD  polyethylene glycol (MIRALAX / GLYCOLAX) packet Take 17 g by mouth daily.   Yes Historical Provider, MD  simvastatin (ZOCOR) 40 MG tablet Take 40 mg by mouth every evening.   Yes Historical Provider, MD  sitaGLIPtin (JANUVIA) 50 MG tablet Take 50 mg by mouth daily.   Yes  Historical Provider, MD  vitamin E (VITAMIN E) 400 UNIT capsule Take 400 Units by mouth daily.   Yes Historical Provider, MD  zolpidem (AMBIEN) 10 MG tablet Take 10 mg by mouth at bedtime.   Yes Historical Provider, MD    Allergies: No Known Allergies  History reviewed. No pertinent family history.  Social History:  reports that she has quit smoking. She has never used smokeless tobacco. She reports that she does not drink alcohol or use illicit drugs.  ROS: All systems reviewed with the patient and was positive as per HPI otherwise all other systems are negative.  PHYSICAL EXAM: Blood pressure 148/80, pulse 105, temperature 98.4 F (36.9 C), temperature source Oral, resp. rate 16, SpO2 99.00%. General: Well-developed well-nourished in no acute cardiopulmonary distress. Alert, awake, oriented x3, in no acute distress. HEENT: Normocephalic atraumatic. Pupils equal round and reactive to light and accommodation. Extraocular movements intact. Oropharynx is clear, no lesions, no exudates. Neck is supple no lymphadenopathy. Patient with lip smacking. No bruits, no goiter. Heart: Regular rate and rhythm, without murmurs, rubs, gallops. Lungs: Clear to auscultation bilaterally. Abdomen: Soft, nontender, nondistended, positive bowel sounds. Extremities: No clubbing cyanosis or edema with positive pedal pulses. Neuro: Cranial nerves II through XII are grossly intact. No focal deficits.  Psych: Flat affect. Suicidal ideations. Homicidal ideations. Poor judgment. Poor insight.    EKG: Not done  No results found for this or any previous visit (from the past 240 hour(s)).   Lab results:  Basename 04/29/12 1140  NA 122*  K 4.2  CL 85*  CO2 26  GLUCOSE 114*  BUN 15  CREATININE 1.05  CALCIUM 9.3  MG --  PHOS --    Basename 04/29/12 1140  AST 11  ALT 5  ALKPHOS 34*  BILITOT 0.2*  PROT 7.3  ALBUMIN 3.6   No results found for this basename: LIPASE:2,AMYLASE:2 in the last 72  hours  Basename 04/29/12 1140  WBC 7.4  NEUTROABS --  HGB 12.0  HCT 34.7*  MCV 86.8  PLT 280   No results found for this basename: CKTOTAL:3,CKMB:3,CKMBINDEX:3,TROPONINI:3 in the last 72 hours No components found with this basename: POCBNP:3 No results found for this basename: DDIMER in the last 72 hours No results found for this basename: HGBA1C:2 in the last 72 hours No results found for this basename: CHOL:2,HDL:2,LDLCALC:2,TRIG:2,CHOLHDL:2,LDLDIRECT:2 in the last 72 hours No results found for this basename: TSH,T4TOTAL,FREET3,T3FREE,THYROIDAB in the last 72 hours No results found for this basename: VITAMINB12:2,FOLATE:2,FERRITIN:2,TIBC:2,IRON:2,RETICCTPCT:2 in the last 72 hours Imaging results:  Ct Head Wo Contrast  04/29/2012  *RADIOLOGY REPORT*  Clinical Data: Hyponatremia and altered mental status.  Depression. Schizoaffective disorder.  CT HEAD WITHOUT CONTRAST  Technique:  Contiguous axial images were obtained from the base of the skull through the vertex without contrast.  Comparison: None.  Findings: Bone windows demonstrate clear paranasal sinuses and mastoid air cells.  Soft tissue windows demonstrate age advanced cerebral atrophy. Mild low density in the periventricular white matter  likely related to small vessel disease.Apparent asymmetric hypoattenuation within the left cerebellar hemisphere on image 6 is favored to be due to patient obliquity.  Otherwise, no  mass lesion, hemorrhage, hydrocephalus, acute infarct, intra-axial, or extra-axial fluid collection.  IMPRESSION:  1. No acute intracranial abnormality. 2.  Apparent asymmetric hypoattenuation within the left cerebellar hemisphere, favored to be due to patient obliquity.  If there are localizing symptoms to suggest subacute ischemia in the left cerebellar hemisphere, consider pre and post contrast brain MR. 3.  Mildly age advanced cerebral atrophy with mild small vessel ischemic change.  Original Report Authenticated By: Consuello Bossier, M.D.   Impression/Plan:  Principal Problem:  *Hyponatremia Active Problems:  GERD (gastroesophageal reflux disease)  Borderline personality disorder  Depression  Hyperlipidemia  Chronic back pain  Hypothyroidism  Schizophrenia  Chloride, decreased level  Passive suicidal ideations  Suicidal ideations  Schizoaffective disorder  HTN (hypertension)  Bipolar affective disorder  Diabetes mellitus   #1 acute on chronic hyponatremia Likely secondary to acute on chronic hyponatremia secondary to diuretics in patient's antihypertensive blood pressure medication in the setting of SSRI of Celexa and probable Depakote. Patient does have a baseline chronic hyponatremia with baseline ranging from 126 - 131. Will admit patient to telemetry. CT of the head which was done had no acute abnormalities. Will check a chest x-ray. Will check a urine sodium, urine creatinine, serum osmolality, urine osmolality, TSH. Will hold patient's antihypertensive medications as well as the Celexa. If no improvement may need to discontinue patient's Depakote however we'll leave patient's Depakote on for now until she is reassessed per psychiatry. Will hydrate with IV fluids and follow.  #2 gastroesophageal reflux disease PPI.  #3 suicidal ideation/homicidal ideations. Patient usually has these symptoms per patient around the anniversary of the death of her brother. Patient Celexa is being discontinued secondary to problem #1. Patient is currently on Depakote however if no improvement in problem #1 we'll need to discontinue patient's Depakote. Will place patient on the suicide precautions. Sitter. Will consult with psychiatry for further evaluation and management. Will likely need inpatient psychiatric management.  #4 schizophrenia/bipolar disorder/schizoaffective disorder/depression Patient Celexa has been held secondary to problem #1. Will continue patient Depakote for now. If no improvement in problem #1  may need to discontinue patient's Depakote. Will consult psychiatry for further evaluation and management.  #5 hypertension Patient's antihypertensive medication of ACE inhibitor and diuretics have been discontinued secondary to problem #1. We'll place patient on Norvasc for blood pressure management.  #6 diabetes mellitus Will hold patient's glipizide and Glucophage. We'll resume patient's synovial. Place on a sliding scale insulin. Check a hemoglobin A1c. Follow.  #7 hypothyroidism Check a TSH. Patient's med rec does not include Synthroid however one year ago patient was on levothyroxine at 88 MCG daily. Will place patient on levothyroxine at 88 MCG for now and monitor.  #8 prophylaxis PPI for GI prophylaxis. Lovenox for DVT prophylaxis.   Riane Rung [phone] 04/29/2012, 6:20 PM

## 2012-04-30 DIAGNOSIS — F603 Borderline personality disorder: Secondary | ICD-10-CM

## 2012-04-30 DIAGNOSIS — R45850 Homicidal ideations: Secondary | ICD-10-CM

## 2012-04-30 DIAGNOSIS — F259 Schizoaffective disorder, unspecified: Secondary | ICD-10-CM

## 2012-04-30 DIAGNOSIS — D649 Anemia, unspecified: Secondary | ICD-10-CM

## 2012-04-30 DIAGNOSIS — R45851 Suicidal ideations: Secondary | ICD-10-CM

## 2012-04-30 LAB — CBC
HCT: 30 % — ABNORMAL LOW (ref 36.0–46.0)
Hemoglobin: 10.5 g/dL — ABNORMAL LOW (ref 12.0–15.0)
MCH: 30.3 pg (ref 26.0–34.0)
MCHC: 35 g/dL (ref 30.0–36.0)
MCV: 86.5 fL (ref 78.0–100.0)
Platelets: 240 10*3/uL (ref 150–400)
RBC: 3.47 MIL/uL — ABNORMAL LOW (ref 3.87–5.11)
RDW: 13.5 % (ref 11.5–15.5)
WBC: 7.8 10*3/uL (ref 4.0–10.5)

## 2012-04-30 LAB — MAGNESIUM: Magnesium: 2.2 mg/dL (ref 1.5–2.5)

## 2012-04-30 LAB — URINALYSIS, ROUTINE W REFLEX MICROSCOPIC
Bilirubin Urine: NEGATIVE
Glucose, UA: NEGATIVE mg/dL
Hgb urine dipstick: NEGATIVE
Ketones, ur: NEGATIVE mg/dL
Leukocytes, UA: NEGATIVE
Nitrite: NEGATIVE
Protein, ur: NEGATIVE mg/dL
Specific Gravity, Urine: 1.011 (ref 1.005–1.030)
Urobilinogen, UA: 1 mg/dL (ref 0.0–1.0)
pH: 7 (ref 5.0–8.0)

## 2012-04-30 LAB — SODIUM, URINE, RANDOM: Sodium, Ur: 116 mEq/L

## 2012-04-30 LAB — VALPROIC ACID LEVEL: Valproic Acid Lvl: 74.8 ug/mL (ref 50.0–100.0)

## 2012-04-30 LAB — GLUCOSE, CAPILLARY
Glucose-Capillary: 101 mg/dL — ABNORMAL HIGH (ref 70–99)
Glucose-Capillary: 107 mg/dL — ABNORMAL HIGH (ref 70–99)
Glucose-Capillary: 135 mg/dL — ABNORMAL HIGH (ref 70–99)
Glucose-Capillary: 103 mg/dL — ABNORMAL HIGH (ref 70–99)

## 2012-04-30 LAB — OSMOLALITY: Osmolality: 263 mOsm/kg — ABNORMAL LOW (ref 275–300)

## 2012-04-30 LAB — BASIC METABOLIC PANEL
BUN: 14 mg/dL (ref 6–23)
CO2: 27 mEq/L (ref 19–32)
Calcium: 8.8 mg/dL (ref 8.4–10.5)
Chloride: 90 mEq/L — ABNORMAL LOW (ref 96–112)
Creatinine, Ser: 0.95 mg/dL (ref 0.50–1.10)
GFR calc Af Amer: 77 mL/min — ABNORMAL LOW (ref >90)
GFR calc non Af Amer: 67 mL/min — ABNORMAL LOW (ref >90)
Glucose, Bld: 95 mg/dL (ref 70–99)
Potassium: 4 mEq/L (ref 3.5–5.1)
Sodium: 125 mEq/L — ABNORMAL LOW (ref 135–145)

## 2012-04-30 LAB — OSMOLALITY, URINE: Osmolality, Ur: 446 mOsm/kg (ref 390–1090)

## 2012-04-30 LAB — HEMOGLOBIN A1C
Hgb A1c MFr Bld: 5.8 % — ABNORMAL HIGH (ref <5.7)
Mean Plasma Glucose: 120 mg/dL — ABNORMAL HIGH (ref <117)

## 2012-04-30 LAB — IRON AND TIBC
Iron: 62 ug/dL (ref 42–135)
Saturation Ratios: 17 % — ABNORMAL LOW (ref 20–55)
TIBC: 360 ug/dL (ref 250–470)
UIBC: 298 ug/dL (ref 125–400)

## 2012-04-30 LAB — CREATININE, URINE, RANDOM: Creatinine, Urine: 64.5 mg/dL

## 2012-04-30 LAB — TSH: TSH: 5.43 u[IU]/mL — ABNORMAL HIGH (ref 0.350–4.500)

## 2012-04-30 MED ORDER — AMLODIPINE BESYLATE 5 MG PO TABS
5.0000 mg | ORAL_TABLET | Freq: Once | ORAL | Status: AC
  Administered 2012-04-30: 5 mg via ORAL
  Filled 2012-04-30: qty 1

## 2012-04-30 MED ORDER — PALIPERIDONE PALMITATE 117 MG/0.75ML IM SUSP: 117.0000 mg | INTRAMUSCULAR | Status: DC

## 2012-04-30 MED ORDER — LORAZEPAM 0.5 MG PO TABS
0.5000 mg | ORAL_TABLET | Freq: Three times a day (TID) | ORAL | Status: DC | PRN
  Administered 2012-04-30 – 2012-05-02 (×4): 0.5 mg via ORAL
  Filled 2012-04-30 (×4): qty 1

## 2012-04-30 MED ORDER — AMLODIPINE BESYLATE 10 MG PO TABS
10.0000 mg | ORAL_TABLET | Freq: Every day | ORAL | Status: DC
  Administered 2012-05-01 – 2012-05-02 (×2): 10 mg via ORAL
  Filled 2012-04-30 (×2): qty 1

## 2012-04-30 NOTE — Progress Notes (Signed)
Patient stated this am that she wants to harm herself.  She stated that she has a plan to cut herself, and she has been feeling this way she she arrived here.  She said she is unable to carry on with her plan since there is no means for her to do so.  Safety sitter is aware.  Safety inspection of patient's room completed.  Will continue to monitor.

## 2012-04-30 NOTE — Progress Notes (Signed)
[facility] PROGRESS NOTE  [name] WUJ:[id number] DOB: [birth year] DOA: 04/29/2012 PCP: Dorrene German, MD  Assessment/Plan: Principal Problem:  *Hyponatremia Active Problems:  GERD (gastroesophageal reflux disease)  Borderline personality disorder  Depression  Hyperlipidemia  Chronic back pain  Hypothyroidism  Schizophrenia  Chloride, decreased level  Passive suicidal ideations  Suicidal ideations  Schizoaffective disorder  HTN (hypertension)  Bipolar affective disorder  Diabetes mellitus  Anemia  #1 acute on chronic hyponatremia Likely multifactorial secondary to medication induced from patient's diuretics and probable celexa/antipsycotics. Patient's baseline sodium ranges from 126 10/18[birth year]1929. Patient's lisinopril HCTZ has been discontinued. Patient's Celexa as a whole. Sodium levels improving with hydration. Decrease IV fluids 100 cc per hour. Monitor. Will continue patient's Depakote for now. Psych following.  #2 gastroesophageal reflux disease PPI  #3 schizophrenia  with auditory hallucinations/suicidal ideation/homicidal ideation Patient has been seen by psychiatry. Patient is noted to have psychotic thoughts. As recommended per psychiatry to continue patient's current regimen of beta, Depakote, Cogentin. Per psychiatry patient needs inpatient psychiatric facility when medically stable. Psych following and appreciate input and recommendations.  #4 hypertension Patient's ACE inhibitor and diuretics have been held secondary to problem #1. Increase Norvasc to 10 mg daily.  #5 well controlled type 2 diabetes Hemoglobin A1c is 5.8. CBGs have ranged from 101- 183. On transient death. Sliding scale insulin.  #6 hypothyroidism Synthroid was not on patient's home med rec. TSH=5.430. Continue Synthroid.  #7 prophylaxis PPI for GI prophylaxis. Lovenox for DVT prophylaxis.    Code Status: Full Family Communication: Updated patient at bedside Disposition Plan:  Inpatient rehabilitation   Brief narrative: [name] is a 56 year old Caucasian female with a history of depression, bipolar disorder, diabetes, schizophrenia, hypothyroidism, hypertension, history of chronic hyponatremia with baseline sodium of 126 - 131 who presents from mental health group home with suicidal ideations, homicidal ideations, and auditory hallucinations Patient denies any fevers, no chills, no chest pain, no shortness of breath, no nausea, no vomiting, no cough, no constipation, no dysuria, no weakness, no focal neurological symptoms, patient does endorse some diarrhea after being started on MiraLAX. Patient was seen in the ED labs obtained showed that patient did have a sodium of 122 will call to admit the patient /at medical stabilization. Patient does endorse hearing voices in the closet she states that the voices are garbled she also states that she's been having thoughts of sexual fantasies against God and also bad thoughts of killing herself and other people which are mostly due to towards her family. Patient states that this is close to the anniversary of when her brother died in 10-Mar-2003 and has had similar thoughts in the past. Patient was sent from the group home to the ED secondary to her suicidal or homicidal ideations.    Consultants:  Psychiatry: Dr. Ferol Luz 04/30/2012  Procedures:  CT head 04/29/2012  Chest x-ray 04/29/2012  Antibiotics:  None  HPI/Subjective: Patient states that she feels somewhat stressed after talking with the past. Patient was like something for her nerves. Patient with no other complaints. Patient tolerating oral intake.  Objective: Filed Vitals:   04/29/12 1843 04/29/12 2143 04/30/12 0604 04/30/12 1422  BP:  142/94 125/71 159/99  Pulse:  73 75 78  Temp:  98.2 F (36.8 C) 97.8 F (36.6 C) 97.9 F (36.6 C)  TempSrc:  Oral Oral Oral  Resp:  18 18 20   Height: 5\' 5"  (1.651 m)     Weight: 112.311 kg (247 lb 9.6 oz)  SpO2:  96%  96% 94%    Intake/Output Summary (Last 24 hours) at 04/30/12 1428 Last data filed at 04/30/12 1321  Gross per 24 hour  Intake    480 ml  Output   2200 ml  Net  -1720 ml   Filed Weights   04/29/12 1843  Weight: 112.311 kg (247 lb 9.6 oz)    Exam: General: Alert, awake, oriented x3, in no acute distress. HEENT: No bruits, no goiter. Heart: Regular rate and rhythm, without murmurs, rubs, gallops. Lungs: Clear to auscultation bilaterally. Abdomen: Soft, nontender, nondistended, positive bowel sounds. Extremities: No clubbing cyanosis or edema with positive pedal pulses. Neuro: Grossly intact, nonfocal.  Data Reviewed: Basic Metabolic Panel:  Lab 04/30/12 1191 04/29/12 1140  NA 125* 122*  K 4.0 4.2  CL 90* 85*  CO2 27 26  GLUCOSE 95 114*  BUN 14 15  CREATININE 0.95 1.05  CALCIUM 8.8 9.3  MG 2.2 1.6  PHOS -- --   Liver Function Tests:  Lab 04/29/12 1140  AST 11  ALT 5  ALKPHOS 34*  BILITOT 0.2*  PROT 7.3  ALBUMIN 3.6   No results found for this basename: LIPASE:5,AMYLASE:5 in the last 168 hours No results found for this basename: AMMONIA:5 in the last 168 hours CBC:  Lab 04/30/12 0454 04/29/12 1140  WBC 7.8 7.4  NEUTROABS -- --  HGB 10.5* 12.0  HCT 30.0* 34.7*  MCV 86.5 86.8  PLT 240 280   Cardiac Enzymes: No results found for this basename: CKTOTAL:5,CKMB:5,CKMBINDEX:5,TROPONINI:5 in the last 168 hours BNP (last 3 results) No results found for this basename: PROBNP:3 in the last 8760 hours CBG:  Lab 04/30/12 1205 04/30/12 0749 04/29/12 2139 04/29/12 1049  GLUCAP 107* 101* 183* 118*    No results found for this or any previous visit (from the past 240 hour(s)).   Studies: Ct Head Wo Contrast  04/29/2012  *RADIOLOGY REPORT*  Clinical Data: Hyponatremia and altered mental status.  Depression. Schizoaffective disorder.  CT HEAD WITHOUT CONTRAST  Technique:  Contiguous axial images were obtained from the base of the skull through the vertex without  contrast.  Comparison: None.  Findings: Bone windows demonstrate clear paranasal sinuses and mastoid air cells.  Soft tissue windows demonstrate age advanced cerebral atrophy. Mild low density in the periventricular white matter likely related to small vessel disease.Apparent asymmetric hypoattenuation within the left cerebellar hemisphere on image 6 is favored to be due to patient obliquity.  Otherwise, no  mass lesion, hemorrhage, hydrocephalus, acute infarct, intra-axial, or extra-axial fluid collection.  IMPRESSION:  1. No acute intracranial abnormality. 2.  Apparent asymmetric hypoattenuation within the left cerebellar hemisphere, favored to be due to patient obliquity.  If there are localizing symptoms to suggest subacute ischemia in the left cerebellar hemisphere, consider pre and post contrast brain MR. 3.  Mildly age advanced cerebral atrophy with mild small vessel ischemic change.  Original Report Authenticated By: Consuello Bossier, M.D.   Portable Chest 1 View  04/29/2012  *RADIOLOGY REPORT*  Clinical Data: Hyponatremia.  PORTABLE CHEST - 1 VIEW  Comparison: None.  Findings: Lungs are clear.  Heart size upper normal.  No pneumothorax or pleural fluid.  IMPRESSION: No acute finding.  Original Report Authenticated By: Bernadene Bell. D'ALESSIO, M.D.    Scheduled Meds:   . sodium chloride   Intravenous STAT  . amLODipine  5 mg Oral Daily  . aspirin EC  81 mg Oral Daily  . benztropine  2 mg Oral BID  .  divalproex  1,500 mg Oral QHS  . docusate sodium  100 mg Oral BID  . enoxaparin (LOVENOX) injection  40 mg Subcutaneous Q24H  . insulin aspart  0-20 Units Subcutaneous TID WC  . levothyroxine  88 mcg Oral QAC breakfast  . linagliptin  5 mg Oral Daily  . magnesium sulfate 1 - 4 g bolus IVPB  2 g Intravenous Once  . meloxicam  15 mg Oral BID  . Oxcarbazepine  300 mg Oral QHS  . paliperidone  6 mg Oral QHS  . Paliperidone Palmitate  117 mg Intramuscular See admin instructions  . pantoprazole  40 mg  Oral Q1200  . sodium chloride  1,000 mL Intravenous Once  . sodium chloride  3 mL Intravenous Q12H  . vitamin E  400 Units Oral Daily  . zolpidem  10 mg Oral QHS   Continuous Infusions:   . sodium chloride 125 mL/hr at 04/30/12 1100    Principal Problem:  *Hyponatremia Active Problems:  GERD (gastroesophageal reflux disease)  Borderline personality disorder  Depression  Hyperlipidemia  Chronic back pain  Hypothyroidism  Schizophrenia  Chloride, decreased level  Passive suicidal ideations  Suicidal ideations  Schizoaffective disorder  HTN (hypertension)  Bipolar affective disorder  Diabetes mellitus  Anemia    Time spent: > 30 mins    [facility]  [facility] Pager [phone]. If 8PM-8AM, please contact night-coverage at [email or web address], password [facility] 04/30/2012, 2:28 PM  LOS: 1 day

## 2012-04-30 NOTE — Progress Notes (Signed)
Pt contact in response to spiritual care consult and nursing referral.   Pt spoke with chaplain about fear and anxiety around [facility] and SI.  Described thoughts of sexual encounters with god, hearing mumbled voices in closet.  Pt clearly described AH and able to orient toward voices being hallucinatory.  In "fearful" and "dark place."   Pt spoke with chaplain about self-injury.  Described becoming overwhelmed with voices and feels as though God is not punishing her enough.  She feels as though she must punish herself and if she does, her fears will subside.  Spoke with chaplain about whether she felt God is able to forgive her and be with her when she is experiencing AH.    Pt was raised [employer] and has found some comfort in [employer] that her sister has given her in the past.  Speaks of prayer being a peaceful and calming place for her.   Worked with chaplain to develop other coping skills - recognizing when she begins to feel overwhelmed and having plan.   Chaplain prayed with pt, provided emotional and spiritual support, empathic presence.  Will continue to follow pt and assess for support needs.    Belva Crome  MDiv, Chaplain.    04/30/12 1500  Clinical Encounter Type  Visited With Patient  Visit Type Initial;Psychological support;Spiritual support;Social support;Behavioral Health  Referral From Nurse  Consult/Referral To Chaplain;Nurse  Recommendations Follow up for support around fear and anxiety   Spiritual Encounters  Spiritual Needs Emotional;Prayer

## 2012-04-30 NOTE — Progress Notes (Signed)
Clinical Social Work Department CLINICAL SOCIAL WORK PSYCHIATRY SERVICE LINE ASSESSMENT 04/30/2012  Patient:  [name]  Account:  [id number]  Admit Date:  04/29/2012  Clinical Social Worker:  Doroteo Glassman  Date/Time:  04/30/2012 11:29 AM Referred by:  Physician  Date referred:  04/30/2012 Reason for Referral  Behavioral Health Issues   Presenting Symptoms/Problems (In the person's/family's own words):   Pt presented due to AVH, delusions and SI.    Abuse/Neglect/Trauma Comments:   Psychiatric History (check all that apply)  Inpatient/hospitilization   Psychiatric medications:  Depakote   Current Mental Health Hospitalizations/Previous Mental Health History:   Current provider:   Dr. Senaida Ores   Place and Date:   Current Medications:   See H&P   Previous Impatient Admission/Date/Reason:   Pt has been to [facility] and [facility], with last inpt stint at [facility] sometime this year.    Chart review notes that Pt has had numerous inpt mental admissions, as well as inpt SA tx.   Emotional Health / Current Symptoms    Suicide/Self Harm  Suicidal ideation (ex: "I can't take any more,I wish I could disappear")  Suicide attempt in past (date/description)   Suicide attempt in the past:   Pt reports that she overdosed several years ago.   Other harmful behavior:   Psychotic/Dissociative Symptoms  Delusional   Other Psychotic/Dissociative Symptoms:   Pt feeling that God is punishing her for her life and she states that she wants to punish herself, too.    Attention/Behavioral Symptoms  Within Normal Limits   Other Attention / Behavioral Symptoms:    Cognitive Impairment  Within Normal Limits   Other Cognitive Impairment:   Pt slow to respond to questions.    Mood and Adjustment  Flat  DEPRESSION    Stress, Anxiety, Trauma, Any Recent Loss/Stressor  None reported   Anxiety (frequency):   Phobia (specify):   Compulsive behavior (specify):     Obsessive behavior (specify):   Other:   Substance Abuse/Use  None   SBIRT completed (please refer for detailed history):    Self-reported substance use:   Urinary Drug Screen Completed:  Y Alcohol level:    Environmental/Housing/Living Arrangement  Assisted Living / Group Home   Who is in the home:   Pt lives in [facility] and has done so for the past 7 years.   Emergency contact:  Joneen Boers, sister.   Financial  Medicare  Medicaid   Patient's Strengths and Goals (patient's own words):   Clinical Social Worker's Interpretive Summary:   Pt reports a strong desire to seriously harm herself.  She states that she will feel better is she's able to do so, as it will bring her relief.  Pt states that she will hurt herself if she's allowed to leave.    To that end, Pt is agreeable to tx at [facility] or [facility], as she's familiar with both facilities and she doesn't want to jeopardize her placement at her current group home.     CSW answered questions re: tx on an inpt basis.    CSW thanked Pt for her time.    Per MD, Pt not medically ready to d/c today; may be ready tomorrow.   Disposition:  Recommend Psych CSW continuing to support while in hospital  CSW to continue to follow.  Providence Crosby, LCSWA Clinical Social Work [phone]

## 2012-04-30 NOTE — Progress Notes (Signed)
Late entry.  When RN asked patient if she had any thoughts of hurting herself, patient stated that she did.  She also had a plan to cut herself but was unable to do so since she did not have the means to do so.  Patient stated that she has been feeling this way since she was admitted.  Dr. Baron Sane made aware and spoke with patient.  RN was also informed that patient likes to hide her plastic forks in her gown pockets.  Patient free from injury on day shift.

## 2012-04-30 NOTE — Consult Note (Signed)
Patient Identification:  [name] Date of Evaluation:  04/30/2012 Reason for Consult:  Suicidal, homicidal ideations; auditory hallucinations  Referring Provider: Dr. Janee Morn  History of Present Illness:Pt says she came from [facility] to ED because she has bad thoughts and hears 'mumbling' in the closet.  She thinks about killing people [family] and has bad thoughts about sexual encounters with God and the [place] 'feeling touch' wanting to have sex with her.  She does not know what medications she is taking.  Past Psychiatric History:She has taken many antipsychotic medications and does not know when she began taking them.  She grieves over her mother's death, remembering her birthday 2022/08/17and her brother died 05/14/2023.  She says she always has bad thoughts in this month.   She took care of her father after mother died.  He developed Alzheimer's Disease and is in a SNF.  She then went to live in the group home. She says she has cut on her arms and burned her arms with cigarettes to stop the bad thoughts [scars apparent].     Past Medical History:     Past Medical History  Diagnosis Date  . Diabetes mellitus   . GERD (gastroesophageal reflux disease)   . Borderline personality disorder   . Schizoaffective disorder   . Depression   . Hyperlipidemia   . Chronic back pain   . Hypothyroidism   . Schizophrenia   . Schizoaffective disorder 04/29/2012  . HTN (hypertension) 04/29/2012  . Bipolar affective disorder 04/29/2012  . Diabetes mellitus 04/29/2012       Past Surgical History  Procedure Date  . Appendectomy   . Knee surgery     right  . Cholecystectomy     Allergies: No Known Allergies  Current Medications:  Prior to Admission medications   Medication Sig Start Date End Date Taking? Authorizing Provider  aspirin EC 81 MG tablet Take 81 mg by mouth daily.   Yes Historical Provider, MD  benztropine (COGENTIN) 2 MG tablet Take 2 mg by mouth 2 (two) times daily.    Yes Historical Provider, MD  citalopram (CELEXA) 40 MG tablet Take 40 mg by mouth daily.    Yes Historical Provider, MD  divalproex (DEPAKOTE ER) 500 MG 24 hr tablet Take 1,500 mg by mouth at bedtime.   Yes Historical Provider, MD  docusate sodium (COLACE) 100 MG capsule Take 100 mg by mouth 2 (two) times daily.   Yes Historical Provider, MD  glipiZIDE (GLUCOTROL) 10 MG tablet Take 10 mg by mouth 2 (two) times daily before a meal.   Yes Historical Provider, MD  glucose blood test strip 1 each by Other route as needed. Use as instructed   Yes Historical Provider, MD  lisinopril-hydrochlorothiazide (PRINZIDE,ZESTORETIC) 10-12.5 MG per tablet Take 1 tablet by mouth daily.   Yes Historical Provider, MD  meloxicam (MOBIC) 15 MG tablet Take 15 mg by mouth 2 (two) times daily.    Yes Historical Provider, MD  metFORMIN (GLUCOPHAGE) 500 MG tablet Take 500 mg by mouth 2 (two) times daily with a meal.   Yes Historical Provider, MD  omeprazole (PRILOSEC) 20 MG capsule Take 40 mg by mouth daily.    Yes Historical Provider, MD  Oxcarbazepine (TRILEPTAL) 300 MG tablet Take 300 mg by mouth at bedtime.   Yes Historical Provider, MD  paliperidone (INVEGA) 6 MG 24 hr tablet Take 6 mg by mouth at bedtime.   Yes Historical Provider, MD  Paliperidone Palmitate (INVEGA  SUSTENNA) 117 MG/0.75ML SUSP Inject 117 mg into the muscle See admin instructions. Every 4 weeks   Yes Historical Provider, MD  polyethylene glycol (MIRALAX / GLYCOLAX) packet Take 17 g by mouth daily.   Yes Historical Provider, MD  simvastatin (ZOCOR) 40 MG tablet Take 40 mg by mouth every evening.   Yes Historical Provider, MD  sitaGLIPtin (JANUVIA) 50 MG tablet Take 50 mg by mouth daily.   Yes Historical Provider, MD  vitamin E (VITAMIN E) 400 UNIT capsule Take 400 Units by mouth daily.   Yes Historical Provider, MD  zolpidem (AMBIEN) 10 MG tablet Take 10 mg by mouth at bedtime.   Yes Historical Provider, MD    Social History:    reports that she  has quit smoking. She has never used smokeless tobacco. She reports that she does not drink alcohol or use illicit drugs.   Family History:    History reviewed. No pertinent family history.  Mental Status Examination/Evaluation: Objective:  Appearance: Casual  Psychomotor Activity:  Normal  Eye Contact::  Good  Speech:  Clear and Coherent  Volume:  Normal  Mood:  Dysphoric  Affect:  Congruent and Depressed  Thought Process:  Coherent, Relevant, Intact and preoccupied by SI/HI paranoid intrusive thoughts about God and Devil  Orientation:  Full  Thought Content:  Suicidal ideation homicidal ideation, auditory hallucinations  Suicidal Thoughts:  No ERROR ENTRY   YES with intention and plan   Homicidal Thoughts:  Yes.  without intent/plan  Judgement:  Poor  Insight:  Lacking    DIAGNOSIS:   AXIS I   Schizophrenia with SI/HI/Auditory hallucinations  AXIS II  Deffered  AXIS III See medical notes.  AXIS IV economic problems, other psychosocial or environmental problems, problems related to social environment and problems with primary support group  AXIS V 51-60 moderate symptoms   Assessment/Plan:  Discussed with Dr. Janee Morn, Psych CSW Dr. Janee Morn says the Celexa SSRI is discontinued because pt arrives in ED with low hyponatremia.  He agrees with other antipsychotic medications; Hyman Bower sustenna IM [will reconcile last dose; given every 4 weeks], Depakote [F/U with depakote level], ambien, Cogentin,    Pt is awake and alert.  She has good eye contact and spontaneous speech.  She has notable tongue thrusting.  She says she has been given many medications over past years, but cannot provide time line  For: Haldol, Prolixin, Zyprexa, Seroquel, Geodon, Risperdal, Abilify, Cogentin.  She currently takes IM [place] p.o. She says she lives in group home and at the time of her mother's BD and brother's death in 20-May-2023. She always has trouble with bad thoughts and  hearing voices.  She says the voices are mumbles but the thoughts involve wanting to hurt and kill people[relatives] and hurt herself, including sexual fantasies about God and the Devil. [Also hyponatremia can contribute to psychotic symptoms.]  She has been to [facility] last year.  She wants to go home and cut herself [shows wrists].  She agrees to go to inpatient psychiatric hospital if accepted.  RECOMMENDATION:  1.  Pt has psychotic thoughts but describes them clearly and in organized manner.  She has capacity to agree with her medical treatment. 2.  Continue all medications named above with indicated scheduling and  blood levelsl. 3.  Suggest transfer to [facility] or comparable inpatient psychiatric facility when medically stable 4.  No further psychiatric needs unless requested.  MD Psychiatrist signs off. Mearl Olver J. Ferol Luz, MD Psychiatrist  04/30/2012 11:41  AM

## 2012-05-01 DIAGNOSIS — F29 Unspecified psychosis not due to a substance or known physiological condition: Secondary | ICD-10-CM

## 2012-05-01 LAB — URINE CULTURE: Colony Count: 10000

## 2012-05-01 LAB — BASIC METABOLIC PANEL
BUN: 11 mg/dL (ref 6–23)
CO2: 27 mEq/L (ref 19–32)
Calcium: 9.3 mg/dL (ref 8.4–10.5)
Chloride: 91 mEq/L — ABNORMAL LOW (ref 96–112)
Creatinine, Ser: 0.92 mg/dL (ref 0.50–1.10)
GFR calc Af Amer: 80 mL/min — ABNORMAL LOW (ref >90)
GFR calc non Af Amer: 69 mL/min — ABNORMAL LOW (ref >90)
Glucose, Bld: 101 mg/dL — ABNORMAL HIGH (ref 70–99)
Potassium: 4.4 mEq/L (ref 3.5–5.1)
Sodium: 126 mEq/L — ABNORMAL LOW (ref 135–145)

## 2012-05-01 LAB — GLUCOSE, CAPILLARY
Glucose-Capillary: 122 mg/dL — ABNORMAL HIGH (ref 70–99)
Glucose-Capillary: 110 mg/dL — ABNORMAL HIGH (ref 70–99)

## 2012-05-01 LAB — VITAMIN B12: Vitamin B-12: 780 pg/mL (ref 211–911)

## 2012-05-01 LAB — FERRITIN: Ferritin: 18 ng/mL (ref 10–291)

## 2012-05-01 LAB — FOLATE: Folate: >20 ng/mL

## 2012-05-01 NOTE — Progress Notes (Signed)
Met with Pt today and notified her that, per MD, she was not medically stable for d/c, as her sodium is still low.  Pt voiced an understanding and stated that she is willing to go inpt when she's medically ready.  CSW to continue to follow.  Providence Crosby, LCSWA Clinical Social Work [phone]

## 2012-05-01 NOTE — Progress Notes (Signed)
[facility] PROGRESS NOTE  [name] ZOX:[id number] DOB: 1956-02-07 DOA: 04/29/2012 PCP: Dorrene German, MD  Assessment/Plan: Principal Problem:  *Hyponatremia Active Problems:  GERD (gastroesophageal reflux disease)  Borderline personality disorder  Depression  Hyperlipidemia  Chronic back pain  Hypothyroidism  Schizophrenia  Chloride, decreased level  Passive suicidal ideations  Suicidal ideations  Schizoaffective disorder  HTN (hypertension)  Bipolar affective disorder  Diabetes mellitus  Anemia  #1 acute on chronic hyponatremia Likely multifactorial secondary to medication induced from patient's diuretics and probable celexa/antipsycotics. Patient's baseline sodium ranges from 126 10/18/1929. Patient's lisinopril HCTZ has been discontinued. Patient's Celexa has been d/c'd. Sodium levels improving with hydration. Continue IV fluids 100 cc per hour. Monitor. Will continue patient's Depakote for now. Psych following.  #2 gastroesophageal reflux disease PPI  #3 schizophrenia  with auditory hallucinations/suicidal ideation/homicidal ideation Patient has been seen by psychiatry. Patient is noted to have psychotic thoughts. As recommended per psychiatry to continue patient's current regimen of invega, trileptal, Depakote, Cogentin. Per psychiatry patient needs inpatient psychiatric facility when medically stable. Psych following and appreciate input and recommendations.  #4 hypertension Patient's ACE inhibitor and diuretics have been held secondary to problem #1. Continue Norvasc 10 mg daily.  #5 well controlled type 2 diabetes Hemoglobin A1c is 5.8. CBGs have ranged from 103- 122. On tranjenta. Sliding scale insulin.  #6 hypothyroidism Synthroid was not on patient's home med rec. TSH=5.430. Continue Synthroid.  #7 prophylaxis PPI for GI prophylaxis. Lovenox for DVT prophylaxis.    Code Status: Full Family Communication: Updated patient at bedside Disposition  Plan: Inpatient rehabilitation   Brief narrative: [name] is a 56 year old Caucasian female with a history of depression, bipolar disorder, diabetes, schizophrenia, hypothyroidism, hypertension, history of chronic hyponatremia with baseline sodium of 126 - 131 who presents from mental health group home with suicidal ideations, homicidal ideations, and auditory hallucinations Patient denies any fevers, no chills, no chest pain, no shortness of breath, no nausea, no vomiting, no cough, no constipation, no dysuria, no weakness, no focal neurological symptoms, patient does endorse some diarrhea after being started on MiraLAX. Patient was seen in the ED labs obtained showed that patient did have a sodium of 122 will call to admit the patient /at medical stabilization. Patient does endorse hearing voices in the closet she states that the voices are garbled she also states that she's been having thoughts of sexual fantasies against God and also bad thoughts of killing herself and other people which are mostly due to towards her family. Patient states that this is close to the anniversary of when her brother died in 2003/03/08 and has had similar thoughts in the past. Patient was sent from the group home to the ED secondary to her suicidal or homicidal ideations.    Consultants:  Psychiatry: Dr. Ferol Luz 04/30/2012  Procedures:  CT head 04/29/2012  Chest x-ray 04/29/2012  Antibiotics:  None  HPI/Subjective: Patient sleeping but easily arousable. Patient still with SI/HI.  Objective: Filed Vitals:   04/30/12 1422 04/30/12 2151 05/01/12 0602 05/01/12 1633  BP: 159/99 152/97 148/92 130/81  Pulse: 78 70 88 87  Temp: 97.9 F (36.6 C) 98.1 F (36.7 C) 98.1 F (36.7 C) 97.9 F (36.6 C)  TempSrc: Oral Oral Oral Oral  Resp: 20 18 17 18   Height:      Weight:      SpO2: 94% 96% 97% 97%    Intake/Output Summary (Last 24 hours) at 05/01/12 1812 Last data filed at 05/01/12 1755  Gross per  24 hour    Intake 2963.33 ml  Output    350 ml  Net 2613.33 ml   Filed Weights   04/29/12 1843  Weight: 112.311 kg (247 lb 9.6 oz)    Exam: General: Alert, awake, oriented x3, in no acute distress. HEENT: No bruits, no goiter. Heart: Regular rate and rhythm, without murmurs, rubs, gallops. Lungs: Clear to auscultation bilaterally. Abdomen: Soft, nontender, nondistended, positive bowel sounds. Extremities: No clubbing cyanosis or edema with positive pedal pulses. Neuro: Grossly intact, nonfocal.  Data Reviewed: Basic Metabolic Panel:  Lab 05/01/12 1610 04/30/12 0454 04/29/12 1140  NA 126* 125* 122*  K 4.4 4.0 4.2  CL 91* 90* 85*  CO2 27 27 26   GLUCOSE 101* 95 114*  BUN 11 14 15   CREATININE 0.92 0.95 1.05  CALCIUM 9.3 8.8 9.3  MG -- 2.2 1.6  PHOS -- -- --   Liver Function Tests:  Lab 04/29/12 1140  AST 11  ALT 5  ALKPHOS 34*  BILITOT 0.2*  PROT 7.3  ALBUMIN 3.6   No results found for this basename: LIPASE:5,AMYLASE:5 in the last 168 hours No results found for this basename: AMMONIA:5 in the last 168 hours CBC:  Lab 04/30/12 0454 04/29/12 1140  WBC 7.8 7.4  NEUTROABS -- --  HGB 10.5* 12.0  HCT 30.0* 34.7*  MCV 86.5 86.8  PLT 240 280   Cardiac Enzymes: No results found for this basename: CKTOTAL:5,CKMB:5,CKMBINDEX:5,TROPONINI:5 in the last 168 hours BNP (last 3 results) No results found for this basename: PROBNP:3 in the last 8760 hours CBG:  Lab 05/01/12 1158 05/01/12 0752 04/30/12 2210 04/30/12 1746 04/30/12 1205  GLUCAP 110* 122* 103* 135* 107*    Recent Results (from the past 240 hour(s))  URINE CULTURE     Status: Normal   Collection Time   04/29/12  7:40 PM      Component Value Range Status Comment   Specimen Description URINE, RANDOM   Final    Special Requests NONE   Final    Culture  Setup Time 04/30/2012 02:11   Final    Colony Count 10,000 COLONIES/ML   Final    Culture     Final    Value: Multiple bacterial morphotypes present, none  predominant. Suggest appropriate recollection if clinically indicated.   Report Status 05/01/2012 FINAL   Final      Studies: Ct Head Wo Contrast  04/29/2012  *RADIOLOGY REPORT*  Clinical Data: Hyponatremia and altered mental status.  Depression. Schizoaffective disorder.  CT HEAD WITHOUT CONTRAST  Technique:  Contiguous axial images were obtained from the base of the skull through the vertex without contrast.  Comparison: None.  Findings: Bone windows demonstrate clear paranasal sinuses and mastoid air cells.  Soft tissue windows demonstrate age advanced cerebral atrophy. Mild low density in the periventricular white matter likely related to small vessel disease.Apparent asymmetric hypoattenuation within the left cerebellar hemisphere on image 6 is favored to be due to patient obliquity.  Otherwise, no  mass lesion, hemorrhage, hydrocephalus, acute infarct, intra-axial, or extra-axial fluid collection.  IMPRESSION:  1. No acute intracranial abnormality. 2.  Apparent asymmetric hypoattenuation within the left cerebellar hemisphere, favored to be due to patient obliquity.  If there are localizing symptoms to suggest subacute ischemia in the left cerebellar hemisphere, consider pre and post contrast brain MR. 3.  Mildly age advanced cerebral atrophy with mild small vessel ischemic change.  Original Report Authenticated By: Consuello Bossier, M.D.   Portable Chest 1 View  04/29/2012  *RADIOLOGY REPORT*  Clinical Data: Hyponatremia.  PORTABLE CHEST - 1 VIEW  Comparison: None.  Findings: Lungs are clear.  Heart size upper normal.  No pneumothorax or pleural fluid.  IMPRESSION: No acute finding.  Original Report Authenticated By: Bernadene Bell. D'ALESSIO, M.D.    Scheduled Meds:    . amLODipine  10 mg Oral Daily  . aspirin EC  81 mg Oral Daily  . benztropine  2 mg Oral BID  . divalproex  1,500 mg Oral QHS  . docusate sodium  100 mg Oral BID  . enoxaparin (LOVENOX) injection  40 mg Subcutaneous Q24H  . insulin  aspart  0-20 Units Subcutaneous TID WC  . levothyroxine  88 mcg Oral QAC breakfast  . linagliptin  5 mg Oral Daily  . meloxicam  15 mg Oral BID  . Oxcarbazepine  300 mg Oral QHS  . paliperidone  6 mg Oral QHS  . Paliperidone Palmitate  117 mg Intramuscular See admin instructions  . pantoprazole  40 mg Oral Q1200  . sodium chloride  3 mL Intravenous Q12H  . vitamin E  400 Units Oral Daily  . zolpidem  10 mg Oral QHS   Continuous Infusions:    . sodium chloride 100 mL/hr at 05/01/12 1652    Principal Problem:  *Hyponatremia Active Problems:  GERD (gastroesophageal reflux disease)  Borderline personality disorder  Depression  Hyperlipidemia  Chronic back pain  Hypothyroidism  Schizophrenia  Chloride, decreased level  Passive suicidal ideations  Suicidal ideations  Schizoaffective disorder  HTN (hypertension)  Bipolar affective disorder  Diabetes mellitus  Anemia    Time spent: > 30 mins    [facility]  [facility] Pager [phone]. If 8PM-8AM, please contact night-coverage at [email or web address], password [facility] 05/01/2012, 6:12 PM  LOS: 2 days

## 2012-05-01 NOTE — Progress Notes (Signed)
Per MD, Pt medically ready for d/c.  Per RN, Pt's sodium is chronically low.  Spoke with Natalia Leatherwood at [facility].  Natalia Leatherwood at [facility] requesting that CSW fax Pt's facesheet to Ax office.  CSW faxed Pt's facesheet to Ax office.  Faxed information to [employer].  CSW to continue to follow.  Providence Crosby, LCSWA Clinical Social Work [phone]

## 2012-05-01 NOTE — Progress Notes (Signed)
Progress Note following consultation:: Pt is seen this am.  She is lying in bed awake but feels sleepy.  She says that she is still hearing the voices and having bad thoughts  Psych CSW says Dr. Janee Morn says he wants to correct her sodium, it has improved slightly. She has chronic hyponatremia.  RECOMMENDATION:  1.  Continue to suggest inpt psychiatric inpatient facility when medically stable  Psych CSWs effort appreciated. 2.  No further psychiatric needs identified,   MD Psychaitrist signs off. Rashee Marschall J. Ferol Luz, MD Psychiatrist  05/01/2012 11:49 PM

## 2012-05-02 DIAGNOSIS — E871 Hypo-osmolality and hyponatremia: Secondary | ICD-10-CM

## 2012-05-02 LAB — GLUCOSE, CAPILLARY
Glucose-Capillary: 130 mg/dL — ABNORMAL HIGH (ref 70–99)
Glucose-Capillary: 135 mg/dL — ABNORMAL HIGH (ref 70–99)
Glucose-Capillary: 99 mg/dL (ref 70–99)
Glucose-Capillary: 119 mg/dL — ABNORMAL HIGH (ref 70–99)
Glucose-Capillary: 48 mg/dL — ABNORMAL LOW (ref 70–99)
Glucose-Capillary: 142 mg/dL — ABNORMAL HIGH (ref 70–99)
Glucose-Capillary: 157 mg/dL — ABNORMAL HIGH (ref 70–99)

## 2012-05-02 LAB — BASIC METABOLIC PANEL
BUN: 12 mg/dL (ref 6–23)
CO2: 24 mEq/L (ref 19–32)
Calcium: 8.8 mg/dL (ref 8.4–10.5)
Chloride: 96 mEq/L (ref 96–112)
Creatinine, Ser: 0.9 mg/dL (ref 0.50–1.10)
GFR calc Af Amer: 83 mL/min — ABNORMAL LOW (ref >90)
GFR calc non Af Amer: 71 mL/min — ABNORMAL LOW (ref >90)
Glucose, Bld: 96 mg/dL (ref 70–99)
Potassium: 4.5 mEq/L (ref 3.5–5.1)
Sodium: 129 mEq/L — ABNORMAL LOW (ref 135–145)

## 2012-05-02 NOTE — Progress Notes (Signed)
Per Fannie Knee at [facility], no beds available, at this time.  She sees a note from [place], [facility], stating for Ax office to run Pt when a bed becomes available.  Fannie Knee states that there will be a bed meeting this morning and that they may learn of d/c's after the meeting.  Spoke with Jill Alexanders at [employer] [facility].  Per Jill Alexanders, Pt declined due to medical acuity.  CSW to continue to follow.  Providence Crosby, LCSWA Clinical Social Work [phone]

## 2012-05-02 NOTE — Progress Notes (Signed)
[facility] PROGRESS NOTE  [name] BJY:[id number] DOB: 27-Nov-1955 DOA: 04/29/2012 PCP: Dorrene German, MD  Assessment/Plan: Principal Problem:  *Hyponatremia Active Problems:  GERD (gastroesophageal reflux disease)  Borderline personality disorder  Depression  Hyperlipidemia  Chronic back pain  Hypothyroidism  Schizophrenia  Chloride, decreased level  Passive suicidal ideations  Suicidal ideations  Schizoaffective disorder  HTN (hypertension)  Bipolar affective disorder  Diabetes mellitus  Anemia  #1 acute on chronic hyponatremia Likely multifactorial secondary to medication induced from patient's diuretics and probable celexa/antipsycotics. Patient's baseline sodium ranges from 126 -131. Patient's lisinopril HCTZ has been discontinued. Patient's Celexa has been d/c'd. Sodium levels improving with hydration. Continue IV fluids 75 cc per hour. Monitor. Will continue patient's Depakote for now. Psych following. Currently stable and at baseline.  #2 gastroesophageal reflux disease PPI  #3 schizophrenia  with auditory hallucinations/suicidal ideation/homicidal ideation Patient has been seen by psychiatry. Patient is noted to have psychotic thoughts. As recommended per psychiatry to continue patient's current regimen of invega, trileptal, Depakote, Cogentin. Per psychiatry patient needs inpatient psychiatric facility when medically stable. Psych following and appreciate input and recommendations.  #4 hypertension Patient's ACE inhibitor and diuretics have been held secondary to problem #1. Continue Norvasc 10 mg daily. Blood pressure controlled on current regimen.  #5 well controlled type 2 diabetes Hemoglobin A1c is 5.8. CBGs have ranged from 99-135. On tranjenta. Sliding scale insulin.  #6 hypothyroidism Synthroid was not on patient's home med rec. TSH=5.430. Continue Synthroid. Will need repeat TFTs in about 4-6 weeks.  #7 prophylaxis PPI for GI prophylaxis.  Lovenox for DVT prophylaxis.    Code Status: Full Family Communication: Updated patient at bedside Disposition Plan: Inpatient rehabilitation   Brief narrative: [name] is a 56 year old Caucasian female with a history of depression, bipolar disorder, diabetes, schizophrenia, hypothyroidism, hypertension, history of chronic hyponatremia with baseline sodium of 126 - 131 who presents from mental health group home with suicidal ideations, homicidal ideations, and auditory hallucinations Patient denies any fevers, no chills, no chest pain, no shortness of breath, no nausea, no vomiting, no cough, no constipation, no dysuria, no weakness, no focal neurological symptoms, patient does endorse some diarrhea after being started on MiraLAX. Patient was seen in the ED labs obtained showed that patient did have a sodium of 122 will call to admit the patient /at medical stabilization. Patient does endorse hearing voices in the closet she states that the voices are garbled she also states that she's been having thoughts of sexual fantasies against God and also bad thoughts of killing herself and other people which are mostly due to towards her family. Patient states that this is close to the anniversary of when her brother died in 03/09/2003 and has had similar thoughts in the past. Patient was sent from the group home to the ED secondary to her suicidal or homicidal ideations.    Consultants:  Psychiatry: Dr. Ferol Luz 04/30/2012  Procedures:  CT head 04/29/2012  Chest x-ray 04/29/2012  Antibiotics:  None  HPI/Subjective: Patient with no complaints. Patient states she is ready for inpatient psychiatry. Patient still with SI/HI.  Objective: Filed Vitals:   05/01/12 0602 05/01/12 1633 05/01/12 2140 05/02/12 0523  BP: 148/92 130/81 119/79 129/89  Pulse: 88 87 87 94  Temp: 98.1 F (36.7 C) 97.9 F (36.6 C) 98 F (36.7 C) 97.6 F (36.4 C)  TempSrc: Oral Oral Oral Oral  Resp: 17 18 18 18   Height:       Weight:  SpO2: 97% 97% 96% 96%    Intake/Output Summary (Last 24 hours) at 05/02/12 1429 Last data filed at 05/02/12 1300  Gross per 24 hour  Intake 3311.67 ml  Output      0 ml  Net 3311.67 ml   Filed Weights   04/29/12 1843  Weight: 112.311 kg (247 lb 9.6 oz)    Exam: General: Alert, awake, oriented x3, in no acute distress. HEENT: No bruits, no goiter. Heart: Regular rate and rhythm, without murmurs, rubs, gallops. Lungs: Clear to auscultation bilaterally. Abdomen: Soft, nontender, nondistended, positive bowel sounds. Extremities: No clubbing cyanosis or edema with positive pedal pulses. Neuro: Grossly intact, nonfocal.  Data Reviewed: Basic Metabolic Panel:  Lab 05/02/12 4098 05/01/12 0448 04/30/12 0454 04/29/12 1140  NA 129* 126* 125* 122*  K 4.5 4.4 4.0 4.2  CL 96 91* 90* 85*  CO2 24 27 27 26   GLUCOSE 96 101* 95 114*  BUN 12 11 14 15   CREATININE 0.90 0.92 0.95 1.05  CALCIUM 8.8 9.3 8.8 9.3  MG -- -- 2.2 1.6  PHOS -- -- -- --   Liver Function Tests:  Lab 04/29/12 1140  AST 11  ALT 5  ALKPHOS 34*  BILITOT 0.2*  PROT 7.3  ALBUMIN 3.6   No results found for this basename: LIPASE:5,AMYLASE:5 in the last 168 hours No results found for this basename: AMMONIA:5 in the last 168 hours CBC:  Lab 04/30/12 0454 04/29/12 1140  WBC 7.8 7.4  NEUTROABS -- --  HGB 10.5* 12.0  HCT 30.0* 34.7*  MCV 86.5 86.8  PLT 240 280   Cardiac Enzymes: No results found for this basename: CKTOTAL:5,CKMB:5,CKMBINDEX:5,TROPONINI:5 in the last 168 hours BNP (last 3 results) No results found for this basename: PROBNP:3 in the last 8760 hours CBG:  Lab 05/02/12 0729 05/01/12 2150 05/01/12 1708 05/01/12 1158 05/01/12 0752  GLUCAP 99 135* 130* 110* 122*    Recent Results (from the past 240 hour(s))  URINE CULTURE     Status: Normal   Collection Time   04/29/12  7:40 PM      Component Value Range Status Comment   Specimen Description URINE, RANDOM   Final    Special  Requests NONE   Final    Culture  Setup Time 04/30/2012 02:11   Final    Colony Count 10,000 COLONIES/ML   Final    Culture     Final    Value: Multiple bacterial morphotypes present, none predominant. Suggest appropriate recollection if clinically indicated.   Report Status 05/01/2012 FINAL   Final      Studies: Ct Head Wo Contrast  04/29/2012  *RADIOLOGY REPORT*  Clinical Data: Hyponatremia and altered mental status.  Depression. Schizoaffective disorder.  CT HEAD WITHOUT CONTRAST  Technique:  Contiguous axial images were obtained from the base of the skull through the vertex without contrast.  Comparison: None.  Findings: Bone windows demonstrate clear paranasal sinuses and mastoid air cells.  Soft tissue windows demonstrate age advanced cerebral atrophy. Mild low density in the periventricular white matter likely related to small vessel disease.Apparent asymmetric hypoattenuation within the left cerebellar hemisphere on image 6 is favored to be due to patient obliquity.  Otherwise, no  mass lesion, hemorrhage, hydrocephalus, acute infarct, intra-axial, or extra-axial fluid collection.  IMPRESSION:  1. No acute intracranial abnormality. 2.  Apparent asymmetric hypoattenuation within the left cerebellar hemisphere, favored to be due to patient obliquity.  If there are localizing symptoms to suggest subacute ischemia in the left cerebellar  hemisphere, consider pre and post contrast brain MR. 3.  Mildly age advanced cerebral atrophy with mild small vessel ischemic change.  Original Report Authenticated By: Consuello Bossier, M.D.   Portable Chest 1 View  04/29/2012  *RADIOLOGY REPORT*  Clinical Data: Hyponatremia.  PORTABLE CHEST - 1 VIEW  Comparison: None.  Findings: Lungs are clear.  Heart size upper normal.  No pneumothorax or pleural fluid.  IMPRESSION: No acute finding.  Original Report Authenticated By: Bernadene Bell. D'ALESSIO, M.D.    Scheduled Meds:    . amLODipine  10 mg Oral Daily  . aspirin  EC  81 mg Oral Daily  . benztropine  2 mg Oral BID  . divalproex  1,500 mg Oral QHS  . docusate sodium  100 mg Oral BID  . enoxaparin (LOVENOX) injection  40 mg Subcutaneous Q24H  . insulin aspart  0-20 Units Subcutaneous TID WC  . levothyroxine  88 mcg Oral QAC breakfast  . linagliptin  5 mg Oral Daily  . meloxicam  15 mg Oral BID  . Oxcarbazepine  300 mg Oral QHS  . paliperidone  6 mg Oral QHS  . Paliperidone Palmitate  117 mg Intramuscular See admin instructions  . pantoprazole  40 mg Oral Q1200  . sodium chloride  3 mL Intravenous Q12H  . vitamin E  400 Units Oral Daily  . zolpidem  10 mg Oral QHS   Continuous Infusions:    . sodium chloride 100 mL/hr at 05/01/12 1652    Principal Problem:  *Hyponatremia Active Problems:  GERD (gastroesophageal reflux disease)  Borderline personality disorder  Depression  Hyperlipidemia  Chronic back pain  Hypothyroidism  Schizophrenia  Chloride, decreased level  Passive suicidal ideations  Suicidal ideations  Schizoaffective disorder  HTN (hypertension)  Bipolar affective disorder  Diabetes mellitus  Anemia    Time spent: > 30 mins    [facility]  [facility] Pager [phone]. If 8PM-8AM, please contact night-coverage at [email or web address], password [facility] 05/02/2012, 2:29 PM  LOS: 3 days

## 2012-05-02 NOTE — Progress Notes (Signed)
Follow up with pt for continued support.   Pt with sitter, who vacated room during conversation.  Pt lying in bed, tearful.   Pt spoke with chaplain about waiting for bed and desire to be out of hospital / ways this setting could be more comfortable and better meet her needs.   [name] described being frustrated earlier in the day and feeling as though she "wanted to pull out the IV's and leave."  In conversation with chaplain she spoke about wanting this to be over and feeling overwhelmed.  She spoke with chaplain about grief around illness and "not being normal."   When speaking of medication changes, pt spoke about "not wanting to have to take meds at all."  Pt described being fearful of medication changes, as she does not know how she will feel.   [name] spoke with chaplain about feelings of self-injury and described moments of feeling overwhelmed and feeling like there was "nothing else to do."    Chaplain provided emotional and spiritual support, engaged in grief work with pt, meditative breathing practices around anxiety and feelings of being overwhelmed.    Pt described discussions with group home around power of attorney.  Pt will speak with sisters and determine best course.     [name]  MDiv, Chaplain

## 2012-05-02 NOTE — Progress Notes (Signed)
Per [facility], no [address] beds today.  Notified Pt of [facility] denial and no beds at [facility] and asked permission to explore other psych facilities, such as [facility].  Pt stated that she doesn't want to go "that far" and wants a bed at [facility].  Pt stated, "I don't know if I can stay here much longer", referring to [facility].  Pt agreed, however, that it's not safe for her to return home, given that she continues to have SI and thoughts to harm herself.  CSW thanked Pt for her time.  Notified MD.  Weekend CSW to f/u with [facility] for bed availability.  Providence Crosby, LCSWA Clinical Social Work [phone]

## 2012-05-02 NOTE — Progress Notes (Signed)
ED CSW was contacted by [facility] regarding bed availability.  Pt has been accepted to [facility] by Dr. Lolly Mustache to Dr. Allena Katz, bed 401-2. RN made aware. RN notified to contact the MD regarding pt's acceptance. All support paper work has been completed and faxed to [facility] for review. Pt is in agreement with this disposition. Pt is voluntary and will transport to [facility] by security and NT. No further needs identified at this time.    Consult back to ED CSW as needed.   [place] Crane, MSW, [employer] Clinical Social Worker

## 2012-05-02 NOTE — BH Assessment (Signed)
Assessment Note   [name] is an 56 y.o. female who per Dr. Ferol Luz : "Pt is seen this am. She is lying in bed awake but feels sleepy. She says that she is still hearing the voices and having bad thoughts Psych CSW says Dr. Janee Morn says he wants to correct her sodium, it has improved slightly.  She has chronic hyponatremia.  RECOMMENDATION:  1. Continue to suggest inpt psychiatric inpatient facility when medically stable Psych CSWs effort appreciated.  2. No further psychiatric needs identified, MD Psychaitrist signs off.  PHYLLIS J. Ferol Luz, MD  Psychiatrist  05/01/2012 11:49 PM "  Pt continues to endorse SI, A hallucinations and belief that she is being punished by God and wants to punish herself as well.  Pt accepted to [facility] inpatient Adult by Dr. Lolly Mustache to Dr. Allena Katz [phone]   Axis I: Schizophrenia Axis II: Deferred Axis III:  Past Medical History  Diagnosis Date  . Diabetes mellitus   . GERD (gastroesophageal reflux disease)   . Borderline personality disorder   . Schizoaffective disorder   . Depression   . Hyperlipidemia   . Chronic back pain   . Hypothyroidism   . Schizophrenia   . Schizoaffective disorder 04/29/2012  . HTN (hypertension) 04/29/2012  . Bipolar affective disorder 04/29/2012  . Diabetes mellitus 04/29/2012   Axis IV: other psychosocial or environmental problems and problems related to social environment Axis V: 21-30 behavior considerably influenced by delusions or hallucinations OR serious impairment in judgment, communication OR inability to function in almost all areas  Past Medical History:  Past Medical History  Diagnosis Date  . Diabetes mellitus   . GERD (gastroesophageal reflux disease)   . Borderline personality disorder   . Schizoaffective disorder   . Depression   . Hyperlipidemia   . Chronic back pain   . Hypothyroidism   . Schizophrenia   . Schizoaffective disorder 04/29/2012  . HTN (hypertension) 04/29/2012  . Bipolar affective  disorder 04/29/2012  . Diabetes mellitus 04/29/2012    Past Surgical History  Procedure Date  . Appendectomy   . Knee surgery     right  . Cholecystectomy     Family History: No family history on file.  Social History:  reports that she has quit smoking. She has never used smokeless tobacco. She reports that she does not drink alcohol or use illicit drugs.  Additional Social History:  Alcohol / Drug Use History of alcohol / drug use?: No history of alcohol / drug abuse  CIWA:   COWS:    Allergies: No Known Allergies  Home Medications:  (Not in a hospital admission)  OB/GYN Status:  No LMP recorded. Patient is postmenopausal.  General Assessment Data Location of Assessment: [facility] Assessment Services Living Arrangements: Other (Comment) (Group Home) Can pt return to current living arrangement?: Yes Admission Status: Voluntary Is patient capable of signing voluntary admission?: Yes Transfer from: Acute Hospital Referral Source: Medical Floor Inpatient     Risk to self Suicidal Ideation: Yes-Currently Present Suicidal Intent: Yes-Currently Present Is patient at risk for suicide?: Yes Suicidal Plan?: No-Not Currently/Within Last 6 Months Access to Means: No What has been your use of drugs/alcohol within the last 12 months?: none Previous Attempts/Gestures: Yes How many times?: 3  Other Self Harm Risks: self injury/psychotic Triggers for Past Attempts: Unpredictable Intentional Self Injurious Behavior: Damaging Family Suicide History: Unknown Recent stressful life event(s): Recent negative physical changes (medical issues) Persecutory voices/beliefs?: Yes Depression: Yes Depression Symptoms: Tearfulness;Insomnia;Despondent;Guilt;Feeling worthless/self pity;Loss of  interest in usual pleasures Substance abuse history and/or treatment for substance abuse?: No Suicide prevention information given to non-admitted patients: Not applicable  Risk to Others Homicidal  Ideation: No Thoughts of Harm to Others: No Current Homicidal Intent: No Current Homicidal Plan: No Access to Homicidal Means: No Identified Victim: none History of harm to others?: No Assessment of Violence: None Noted Violent Behavior Description: none Does patient have access to weapons?: No Criminal Charges Pending?: No Does patient have a court date: No  Psychosis Hallucinations: Auditory;With command;Visual Delusions: Persecutory  Mental Status Report Appear/Hygiene: Disheveled Eye Contact: Poor Motor Activity: Unable to assess Speech: Unable to assess Level of Consciousness: Quiet/awake;Crying Mood: Sad;Depressed Affect: Depressed;Sad Anxiety Level: Moderate Thought Processes: Coherent Judgement: Impaired Orientation: Person;Time;Situation;Place Obsessive Compulsive Thoughts/Behaviors: None  Cognitive Functioning Concentration: Decreased Memory: Recent Intact;Remote Intact IQ: Average Insight: Poor Impulse Control: Poor Appetite: Poor Weight Loss: 0  Weight Gain: 0  Sleep: Increased Total Hours of Sleep: 0  Vegetative Symptoms: None  ADLScreening [facility] Assessment Services) Patient's cognitive ability adequate to safely complete daily activities?: Yes Patient able to express need for assistance with ADLs?: Yes Independently performs ADLs?: No  Abuse/Neglect [facility]) Physical Abuse: Denies Verbal Abuse: Denies Sexual Abuse: Denies  Prior Inpatient Therapy Prior Inpatient Therapy: Yes Prior Therapy Dates: multiple Prior Therapy Facilty/Provider(s): [facility], [facility] Reason for Treatment: schizophrenia  Prior Outpatient Therapy Prior Outpatient Therapy: Yes Prior Therapy Dates: unk Prior Therapy Facilty/Provider(s): unk Reason for Treatment: schizophrenia  ADL Screening (condition at time of admission) Patient's cognitive ability adequate to safely complete daily activities?: Yes Patient able to express need for assistance with ADLs?: Yes Independently  performs ADLs?: No       Abuse/Neglect Assessment (Assessment to be complete while patient is alone) Physical Abuse: Denies Verbal Abuse: Denies Sexual Abuse: Denies Exploitation of patient/patient's resources: Denies Self-Neglect: Denies          Additional Information 1:1 In Past 12 Months?: Yes CIRT Risk: No Elopement Risk: No Does patient have medical clearance?: Yes     Disposition:  Disposition Disposition of Patient: Inpatient treatment program Type of inpatient treatment program: Adult  On Site Evaluation by:   Reviewed with Physician:     Danelle Berry 05/02/2012 9:53 PM

## 2012-05-03 ENCOUNTER — Inpatient Hospital Stay (HOSPITAL_COMMUNITY)
Admission: AD | Admit: 2012-05-03 | Discharge: 2012-05-15 | DRG: 885 | Disposition: A | Discharge status: Home / Self Care | Payer: Medicare Other | Source: Ambulatory Visit | Attending: Psychiatry | Admitting: Psychiatry

## 2012-05-03 DIAGNOSIS — F200 Paranoid schizophrenia: Principal | ICD-10-CM | POA: Diagnosis present

## 2012-05-03 DIAGNOSIS — Z79899 Other long term (current) drug therapy: Secondary | ICD-10-CM

## 2012-05-03 DIAGNOSIS — E039 Hypothyroidism, unspecified: Secondary | ICD-10-CM | POA: Diagnosis present

## 2012-05-03 DIAGNOSIS — K219 Gastro-esophageal reflux disease without esophagitis: Secondary | ICD-10-CM | POA: Diagnosis present

## 2012-05-03 DIAGNOSIS — R45851 Suicidal ideations: Secondary | ICD-10-CM

## 2012-05-03 DIAGNOSIS — Z7982 Long term (current) use of aspirin: Secondary | ICD-10-CM

## 2012-05-03 DIAGNOSIS — G8929 Other chronic pain: Secondary | ICD-10-CM | POA: Diagnosis present

## 2012-05-03 DIAGNOSIS — I10 Essential (primary) hypertension: Secondary | ICD-10-CM

## 2012-05-03 DIAGNOSIS — R45850 Homicidal ideations: Secondary | ICD-10-CM

## 2012-05-03 DIAGNOSIS — E785 Hyperlipidemia, unspecified: Secondary | ICD-10-CM | POA: Diagnosis present

## 2012-05-03 DIAGNOSIS — E119 Type 2 diabetes mellitus without complications: Secondary | ICD-10-CM | POA: Diagnosis present

## 2012-05-03 DIAGNOSIS — F603 Borderline personality disorder: Secondary | ICD-10-CM | POA: Diagnosis present

## 2012-05-03 DIAGNOSIS — E878 Other disorders of electrolyte and fluid balance, not elsewhere classified: Secondary | ICD-10-CM | POA: Diagnosis present

## 2012-05-03 DIAGNOSIS — F319 Bipolar disorder, unspecified: Secondary | ICD-10-CM

## 2012-05-03 DIAGNOSIS — E871 Hypo-osmolality and hyponatremia: Principal | ICD-10-CM

## 2012-05-03 DIAGNOSIS — M549 Dorsalgia, unspecified: Secondary | ICD-10-CM | POA: Diagnosis present

## 2012-05-03 LAB — BASIC METABOLIC PANEL
BUN: 16 mg/dL (ref 6–23)
CO2: 28 mEq/L (ref 19–32)
Calcium: 9.2 mg/dL (ref 8.4–10.5)
Chloride: 97 mEq/L (ref 96–112)
Creatinine, Ser: 1.05 mg/dL (ref 0.50–1.10)
GFR calc Af Amer: 68 mL/min — ABNORMAL LOW (ref >90)
GFR calc non Af Amer: 59 mL/min — ABNORMAL LOW (ref >90)
Glucose, Bld: 136 mg/dL — ABNORMAL HIGH (ref 70–99)
Potassium: 4.5 mEq/L (ref 3.5–5.1)
Sodium: 131 mEq/L — ABNORMAL LOW (ref 135–145)

## 2012-05-03 LAB — GLUCOSE, CAPILLARY
Glucose-Capillary: 112 mg/dL — ABNORMAL HIGH (ref 70–99)
Glucose-Capillary: 107 mg/dL — ABNORMAL HIGH (ref 70–99)
Glucose-Capillary: 153 mg/dL — ABNORMAL HIGH (ref 70–99)
Glucose-Capillary: 88 mg/dL (ref 70–99)

## 2012-05-03 MED ORDER — PANTOPRAZOLE SODIUM 40 MG PO TBEC
40.0000 mg | DELAYED_RELEASE_TABLET | Freq: Every day | ORAL | Status: DC
  Administered 2012-05-04 – 2012-05-05 (×2): 40 mg via ORAL
  Filled 2012-05-03 (×3): qty 1

## 2012-05-03 MED ORDER — METFORMIN HCL 500 MG PO TABS
500.0000 mg | ORAL_TABLET | Freq: Two times a day (BID) | ORAL | Status: DC
  Administered 2012-05-03: 500 mg via ORAL
  Filled 2012-05-03 (×6): qty 1

## 2012-05-03 MED ORDER — INSULIN ASPART 100 UNIT/ML ~~LOC~~ SOLN
0.0000 [IU] | Freq: Three times a day (TID) | SUBCUTANEOUS | Status: DC
  Administered 2012-05-04: 4 [IU] via SUBCUTANEOUS
  Administered 2012-05-06: 3 [IU] via SUBCUTANEOUS
  Administered 2012-05-07: 4 [IU] via SUBCUTANEOUS
  Administered 2012-05-07: 3 [IU] via SUBCUTANEOUS
  Administered 2012-05-09: 4 [IU] via SUBCUTANEOUS
  Administered 2012-05-10 – 2012-05-12 (×3): 3 [IU] via SUBCUTANEOUS

## 2012-05-03 MED ORDER — PALIPERIDONE ER 6 MG PO TB24
6.0000 mg | ORAL_TABLET | Freq: Every day | ORAL | Status: DC
  Administered 2012-05-03 – 2012-05-04 (×2): 6 mg via ORAL
  Filled 2012-05-03 (×3): qty 1
  Filled 2012-05-03 (×2): qty 2

## 2012-05-03 MED ORDER — BENZTROPINE MESYLATE 2 MG PO TABS
2.0000 mg | ORAL_TABLET | Freq: Two times a day (BID) | ORAL | Status: DC
  Administered 2012-05-03 – 2012-05-05 (×4): 2 mg via ORAL
  Filled 2012-05-03: qty 1
  Filled 2012-05-03: qty 2
  Filled 2012-05-03: qty 1
  Filled 2012-05-03: qty 2
  Filled 2012-05-03 (×4): qty 1

## 2012-05-03 MED ORDER — LORAZEPAM 0.5 MG PO TABS
0.5000 mg | ORAL_TABLET | Freq: Two times a day (BID) | ORAL | Status: DC | PRN
  Administered 2012-05-03 – 2012-05-12 (×4): 0.5 mg via ORAL
  Filled 2012-05-03 (×4): qty 1

## 2012-05-03 MED ORDER — SIMVASTATIN 40 MG PO TABS
40.0000 mg | ORAL_TABLET | Freq: Every evening | ORAL | Status: DC
  Administered 2012-05-03 – 2012-05-04 (×2): 40 mg via ORAL
  Filled 2012-05-03 (×4): qty 1

## 2012-05-03 MED ORDER — AMLODIPINE BESYLATE 10 MG PO TABS
10.0000 mg | ORAL_TABLET | Freq: Every day | ORAL | Status: DC
  Administered 2012-05-04 – 2012-05-15 (×12): 10 mg via ORAL
  Filled 2012-05-03 (×7): qty 1
  Filled 2012-05-03: qty 2
  Filled 2012-05-03 (×7): qty 1

## 2012-05-03 MED ORDER — VITAMIN E 180 MG (400 UNIT) PO CAPS
400.0000 [IU] | ORAL_CAPSULE | Freq: Every day | ORAL | Status: DC
  Administered 2012-05-03 – 2012-05-15 (×13): 400 [IU] via ORAL
  Filled 2012-05-03 (×15): qty 1

## 2012-05-03 MED ORDER — DOCUSATE SODIUM 100 MG PO CAPS
100.0000 mg | ORAL_CAPSULE | Freq: Two times a day (BID) | ORAL | Status: DC
  Administered 2012-05-03 – 2012-05-05 (×4): 100 mg via ORAL
  Filled 2012-05-03 (×8): qty 1

## 2012-05-03 MED ORDER — LINAGLIPTIN 5 MG PO TABS
5.0000 mg | ORAL_TABLET | Freq: Every day | ORAL | Status: DC
  Administered 2012-05-03 – 2012-05-15 (×13): 5 mg via ORAL
  Filled 2012-05-03 (×15): qty 1

## 2012-05-03 MED ORDER — ASPIRIN EC 81 MG PO TBEC
81.0000 mg | DELAYED_RELEASE_TABLET | Freq: Every day | ORAL | Status: DC
  Administered 2012-05-03 – 2012-05-15 (×13): 81 mg via ORAL
  Filled 2012-05-03 (×16): qty 1

## 2012-05-03 MED ORDER — GLIPIZIDE 10 MG PO TABS
10.0000 mg | ORAL_TABLET | Freq: Two times a day (BID) | ORAL | Status: DC
  Administered 2012-05-04 – 2012-05-15 (×23): 10 mg via ORAL
  Filled 2012-05-03 (×25): qty 1

## 2012-05-03 MED ORDER — LISINOPRIL-HYDROCHLOROTHIAZIDE 10-12.5 MG PO TABS: 1.0000 | ORAL_TABLET | Freq: Every day | ORAL | Status: DC

## 2012-05-03 MED ORDER — HYDROCHLOROTHIAZIDE 12.5 MG PO CAPS
12.5000 mg | ORAL_CAPSULE | Freq: Every day | ORAL | Status: DC
  Filled 2012-05-03 (×2): qty 1

## 2012-05-03 MED ORDER — ALUM & MAG HYDROXIDE-SIMETH 200-200-20 MG/5ML PO SUSP
30.0000 mL | ORAL | Status: DC | PRN
  Administered 2012-05-06: 30 mL via ORAL

## 2012-05-03 MED ORDER — OXCARBAZEPINE 300 MG PO TABS
300.0000 mg | ORAL_TABLET | Freq: Every day | ORAL | Status: DC
  Administered 2012-05-03 – 2012-05-14 (×12): 300 mg via ORAL
  Filled 2012-05-03 (×14): qty 1

## 2012-05-03 MED ORDER — ACETAMINOPHEN 325 MG PO TABS
650.0000 mg | ORAL_TABLET | Freq: Four times a day (QID) | ORAL | Status: DC | PRN
  Administered 2012-05-11 – 2012-05-12 (×3): 650 mg via ORAL

## 2012-05-03 MED ORDER — PALIPERIDONE PALMITATE 117 MG/0.75ML IM SUSP: 117.0000 mg | INTRAMUSCULAR | Status: DC

## 2012-05-03 MED ORDER — CITALOPRAM HYDROBROMIDE 40 MG PO TABS
40.0000 mg | ORAL_TABLET | Freq: Every day | ORAL | Status: DC
  Filled 2012-05-03 (×2): qty 1

## 2012-05-03 MED ORDER — BENZTROPINE MESYLATE 1 MG PO TABS
1.0000 mg | ORAL_TABLET | Freq: Two times a day (BID) | ORAL | Status: DC
  Filled 2012-05-03 (×2): qty 1

## 2012-05-03 MED ORDER — LISINOPRIL 10 MG PO TABS
10.0000 mg | ORAL_TABLET | Freq: Every day | ORAL | Status: DC
  Filled 2012-05-03 (×2): qty 1

## 2012-05-03 MED ORDER — LEVOTHYROXINE SODIUM 88 MCG PO TABS
88.0000 ug | ORAL_TABLET | Freq: Every day | ORAL | Status: DC
  Administered 2012-05-04 – 2012-05-15 (×12): 88 ug via ORAL
  Filled 2012-05-03 (×15): qty 1

## 2012-05-03 MED ORDER — MELOXICAM 15 MG PO TABS
15.0000 mg | ORAL_TABLET | Freq: Two times a day (BID) | ORAL | Status: DC
  Administered 2012-05-03 – 2012-05-07 (×8): 15 mg via ORAL
  Filled 2012-05-03: qty 2
  Filled 2012-05-03 (×4): qty 1
  Filled 2012-05-03: qty 2
  Filled 2012-05-03: qty 1
  Filled 2012-05-03: qty 2
  Filled 2012-05-03 (×7): qty 1

## 2012-05-03 MED ORDER — METFORMIN HCL 500 MG PO TABS
500.0000 mg | ORAL_TABLET | Freq: Two times a day (BID) | ORAL | Status: DC
  Administered 2012-05-04 – 2012-05-15 (×23): 500 mg via ORAL
  Filled 2012-05-03 (×26): qty 1

## 2012-05-03 MED ORDER — PALIPERIDONE PALMITATE 117 MG/0.75ML IM SUSP
117.0000 mg | INTRAMUSCULAR | Status: DC
  Filled 2012-05-03: qty 1

## 2012-05-03 MED ORDER — ZOLPIDEM TARTRATE 10 MG PO TABS
10.0000 mg | ORAL_TABLET | Freq: Every evening | ORAL | Status: DC | PRN
  Administered 2012-05-03: 10 mg via ORAL
  Filled 2012-05-03: qty 1

## 2012-05-03 MED ORDER — MAGNESIUM HYDROXIDE 400 MG/5ML PO SUSP: 30.0000 mL | Freq: Every day | ORAL | Status: DC | PRN

## 2012-05-03 MED ORDER — POLYETHYLENE GLYCOL 3350 17 G PO PACK
17.0000 g | PACK | Freq: Every day | ORAL | Status: DC
  Administered 2012-05-12: 17 g via ORAL
  Filled 2012-05-03 (×15): qty 1

## 2012-05-03 MED ORDER — GLIPIZIDE 10 MG PO TABS
10.0000 mg | ORAL_TABLET | Freq: Two times a day (BID) | ORAL | Status: DC
  Administered 2012-05-03: 10 mg via ORAL
  Filled 2012-05-03 (×5): qty 1
  Filled 2012-05-03: qty 2

## 2012-05-03 NOTE — Progress Notes (Signed)
Psychoeducational Group Note  Date:  05/03/2012 Time:1000am  Group Topic/Focus:  Identifying Needs:   The focus of this group is to help patients identify their personal needs that have been historically problematic and identify healthy behaviors to address their needs.  Participation Level:  Did Not Attend  Participation Quality:  [name] 05/03/2012,11:19 AM

## 2012-05-03 NOTE — Progress Notes (Signed)
Patient ID: [name], female   DOB: 11-09-1955, 56 y.o.   MRN: [id number] This 56 yo swf was transferred to [facility] from [place], [place].  States she came in 04/29/12, lives in group home, [facility], and was sent to [facility] after expressing thoughts of hurting herself.  States she has been having thoughts of having intercourse with God and the devil, and then wanted to punish herself for those thoughts by cutting or burning herself with a cigarette.  Also stated she had a dream that her mother was schizophrenic and had tried to kill her. Has been able to contract for safety and will notify staff if she feels she may hurt herself, states for now, they are thoughts and doesn't intend to act on them.   Pt arrived in a w/c, gait is unsteady, not able to walk any distance without a walker or W/C.  States she frequently needs assistance with bath, ADL's and has fallen in the last 3-4 wks.  Instructed to call for assistance to go to BR, or anything else when she gets up in am, and she verbalized understanding.   Medical hx includes NIDDM, HTN, bipolar, depression, schizophrenia, chronic hyponatremia, hypothyroidism, but has been off synthroid for awhile. Was treated for hyponatremia with saline IV's while at [facility], Na on admission was 122.   Surgical hx includes appendectomy in 1977, cholecystectomy in 1987, and arthroscopic surgery to rt knee in early 80's.  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  Stated was here at [facility] about 2 years ago.  Was reoriented to unit, rules, basic orders obtained,Will be further evaluated in am.  Was taken to her room and is resting quietly.  Will continue to monitor for safety.

## 2012-05-03 NOTE — Discharge Summary (Signed)
Physician Discharge Summary  [name] NWG:[id number] DOB: 08-14-1956 DOA: 04/29/2012  PCP: Dorrene German, MD  Admit date: 04/29/2012 Discharge date: 05/03/2012  Recommendations for Outpatient Follow-up:  1. Once patient is discharged from behavioral [facility] she'll need to followup with her PCP as outpatient. Will followup with patient's PCP she'll need a basic metabolic profile done to followup on electrolytes and renal function. Patient lisinopril HCTZ has been discontinued that she's been started on Norvasc 10 mg daily for blood pressure control. Patient's PCP will need to reassess her blood pressure.  Discharge Diagnoses:  Principal Problem:  *Hyponatremia Active Problems:  GERD (gastroesophageal reflux disease)  Borderline personality disorder  Depression  Hyperlipidemia  Chronic back pain  Hypothyroidism  Schizophrenia  Chloride, decreased level  Passive suicidal ideations  Suicidal ideations  Schizoaffective disorder  HTN (hypertension)  Bipolar affective disorder  Diabetes mellitus  Anemia   Discharge Condition: Stable and improved  Diet recommendation: Carb modified diet  Filed Weights   04/29/12 1843 05/02/12 1434  Weight: 112.311 kg (247 lb 9.6 oz) 114.397 kg (252 lb 3.2 oz)    History of present illness:  [name] is a 56 year old Caucasian female with a history of depression, bipolar disorder, diabetes, schizophrenia, hypothyroidism, hypertension, history of chronic hyponatremia with baseline sodium of 126 - 131 who presents from mental health group home with suicidal ideations, homicidal ideations, and auditory hallucinations Patient denies any fevers, no chills, no chest pain, no shortness of breath, no nausea, no vomiting, no cough, no constipation, no dysuria, no weakness, no focal neurological symptoms, patient does endorse some diarrhea after being started on MiraLAX. Patient was seen in the ED labs obtained showed that patient did have a  sodium of 122 will call to admit the patient /at medical stabilization. Patient does endorse hearing voices in the closet she states that the voices are garbled she also states that she's been having thoughts of sexual fantasies against God and also bad thoughts of killing herself and other people which are mostly due to towards her family. Patient states that this is close to the anniversary of when her brother died in 2003-03-15 and has had similar thoughts in the past. Patient was sent from the group home to the ED secondary to her suicidal or homicidal ideations   Hospital Course:  1 acute on chronic hyponatremia  Patient was admitted with acute on chronic hyponatremia with an initial sodium of 122. It was felt etiology of patient's hyponatremia was likely multifactorial secondary to medication induced from patient's diuretics and probable celexa/antipsycotics. Patient's diuretics and ACE inhibitor was discontinued. Her Celexa was also discontinued. Patient was placed on IV fluids and monitored. Patient's baseline sodium ranges from 126 -131. Patient's sodium levels improved with hydration and were back to her baseline. On day of discharge patient's sodium levels was 129. Patient be discharged in stable condition all of her Celexa and lisinopril HCTZ. Patient in need to followup with her PCP as outpatient and will need a repeat basic metabolic profile done to follow his sodium levels. Patient Celexa and lisinopril HCTZ will not be continued on discharge.    #2 schizophrenia with auditory hallucinations/suicidal ideation/homicidal ideation  Patient was admitted with auditory hallucinations suicidal ideations and homicidal ideations. Patient was noted to have psychosis. Patient was maintained on a home regimen of the vague, Trileptal, Depakote, and Cogentin. Patient Celexa was discontinued secondary to problem #1. A psychiatric consultation was obtained and patient was seen in consultation by Dr. Ferol Luz. It was  felt per psychiatric consultation that patient required inpatient psychiatric treatment. Patient was subsequently transferred to behavioral [facility] and 12:37 AM on 05/03/2012. Patient will be discharged on her current regimen of invega, depakote, congentin.patient will not be discharged on Celexa secondary to problem #1. Further management will be per psychiatry. #3 hypertension  Patient's ACE inhibitor and diuretics have been held secondary to problem #1. Patient was started on Norvasc 10 mg daily for blood pressure control. Patient blood pressure was well controlled on Norvasc 10 mg daily and patient be discharged on Norvasc 10 mg daily. Patient will followup with her PCP as a  outpatient once she is discharged from behavioral [facility] Center.   The rest of patient's chronic medical issues remained stable throughout the hospitalization patient be discharged in stable and improved condition.     Procedures: CT head 04/29/2012  Chest x-ray 04/29/2012  Consultations: Psychiatry: Dr. Ferol Luz 04/30/2012     Discharge Exam: Filed Vitals:   05/02/12 2338  BP: 141/83  Pulse: 78  Temp: 98 F (36.7 C)  Resp: 18   Filed Vitals:   05/01/12 2140 05/02/12 0523 05/02/12 1434 05/02/12 2338  BP: 119/79 129/89 152/95 141/83  Pulse: 87 94 94 78  Temp: 98 F (36.7 C) 97.6 F (36.4 C) 98.2 F (36.8 C) 98 F (36.7 C)  TempSrc: Oral Oral Oral Oral  Resp: 18 18 22 18   Height:      Weight:   114.397 kg (252 lb 3.2 oz)   SpO2: 96% 96% 95% 98%    General: NAD Cardiovascular: RRR Respiratory: CTAB  Discharge Instructions   Medication List  As of 05/03/2012  5:06 PM    patient was discharged right after midnight on the morning of 05/03/2012 and a such a list of medications were not made available.  Patient should have been discharged on : Norvasc 10 mg daily. Aspirin 81 mg daily Benztropine 2 mg twice a day Depakote 1500 mg each bedtime Colace 100 mg twice a day Levothyroxine 88  MCG every morning  Glipizide 10 mg by mouth twice a day Mobic 15 mg by mouth twice a day Glucophage 500 mg by mouth twice a day Omeprazole 40 mg by mouth daily Trileptal 300 mg by mouth each bedtime INVega 6 mg by mouth each bedtime INVega 117 mg IM every 4 weeks MiraLAX 17 g by mouth daily Zocor 40 mg by mouth daily Januvia 50 mg by mouth daily Vitamin D 400 units by mouth daily Ambien 10 mg by mouth each bedtime         The results of significant diagnostics from this hospitalization (including imaging, microbiology, ancillary and laboratory) are listed below for reference.    Significant Diagnostic Studies: Ct Head Wo Contrast  04/29/2012  *RADIOLOGY REPORT*  Clinical Data: Hyponatremia and altered mental status.  Depression. Schizoaffective disorder.  CT HEAD WITHOUT CONTRAST  Technique:  Contiguous axial images were obtained from the base of the skull through the vertex without contrast.  Comparison: None.  Findings: Bone windows demonstrate clear paranasal sinuses and mastoid air cells.  Soft tissue windows demonstrate age advanced cerebral atrophy. Mild low density in the periventricular white matter likely related to small vessel disease.Apparent asymmetric hypoattenuation within the left cerebellar hemisphere on image 6 is favored to be due to patient obliquity.  Otherwise, no  mass lesion, hemorrhage, hydrocephalus, acute infarct, intra-axial, or extra-axial fluid collection.  IMPRESSION:  1. No acute intracranial abnormality. 2.  Apparent asymmetric hypoattenuation within the left cerebellar hemisphere,  favored to be due to patient obliquity.  If there are localizing symptoms to suggest subacute ischemia in the left cerebellar hemisphere, consider pre and post contrast brain MR. 3.  Mildly age advanced cerebral atrophy with mild small vessel ischemic change.  Original Report Authenticated By: Consuello Bossier, M.D.   Portable Chest 1 View  04/29/2012  *RADIOLOGY REPORT*  Clinical  Data: Hyponatremia.  PORTABLE CHEST - 1 VIEW  Comparison: None.  Findings: Lungs are clear.  Heart size upper normal.  No pneumothorax or pleural fluid.  IMPRESSION: No acute finding.  Original Report Authenticated By: Bernadene Bell. Maricela Curet, M.D.    Microbiology: Recent Results (from the past 240 hour(s))  URINE CULTURE     Status: Normal   Collection Time   04/29/12  7:40 PM      Component Value Range Status Comment   Specimen Description URINE, RANDOM   Final    Special Requests NONE   Final    Culture  Setup Time 04/30/2012 02:11   Final    Colony Count 10,000 COLONIES/ML   Final    Culture     Final    Value: Multiple bacterial morphotypes present, none predominant. Suggest appropriate recollection if clinically indicated.   Report Status 05/01/2012 FINAL   Final      Labs: Basic Metabolic Panel:  Lab 05/02/12 1610 05/01/12 0448 04/30/12 0454 04/29/12 1140  NA 129* 126* 125* 122*  K 4.5 4.4 4.0 4.2  CL 96 91* 90* 85*  CO2 24 27 27 26   GLUCOSE 96 101* 95 114*  BUN 12 11 14 15   CREATININE 0.90 0.92 0.95 1.05  CALCIUM 8.8 9.3 8.8 9.3  MG -- -- 2.2 1.6  PHOS -- -- -- --   Liver Function Tests:  Lab 04/29/12 1140  AST 11  ALT 5  ALKPHOS 34*  BILITOT 0.2*  PROT 7.3  ALBUMIN 3.6   No results found for this basename: LIPASE:5,AMYLASE:5 in the last 168 hours No results found for this basename: AMMONIA:5 in the last 168 hours CBC:  Lab 04/30/12 0454 04/29/12 1140  WBC 7.8 7.4  NEUTROABS -- --  HGB 10.5* 12.0  HCT 30.0* 34.7*  MCV 86.5 86.8  PLT 240 280   Cardiac Enzymes: No results found for this basename: CKTOTAL:5,CKMB:5,CKMBINDEX:5,TROPONINI:5 in the last 168 hours BNP: BNP (last 3 results) No results found for this basename: PROBNP:3 in the last 8760 hours CBG:  Lab 05/03/12 1141 05/03/12 0653 05/02/12 2130 05/02/12 1710 05/02/12 1258  GLUCAP 107* 112* 157* 142* 48*    Time coordinating discharge: 45 minutes  Signed:  Olyvia Gopal  [facility] 05/03/2012, 5:06 PM

## 2012-05-03 NOTE — H&P (Signed)
  Pt was seen by me today. Will continue current meds.  The detailed H&P note will be done by mid level (NP/PA).  

## 2012-05-03 NOTE — Progress Notes (Signed)
[facility] Group Notes:  (Counselor/Nursing/MHT/Case Management/Adjunct)  05/03/2012 6:31 PM  Type of Therapy:  Group Therapy  Participation Level:  Did Not Attend     Summary of Progress/Problems: Pt did not attend group.   Berlin Hun, MSW, LCSW 05/03/2012, 6:31 PM

## 2012-05-03 NOTE — H&P (Signed)
Psychiatric Admission Assessment Adult  Patient Identification:  [name] Date of Evaluation:  05/03/2012 Chief Complaint:  Schizoaffective Disorder History of Present Illness: This patient presented to the ED from her group home endorsing suicidal ideations, homicidal ideations, with plans to cut herself and others.  She has had previous admissions around this time as it is the anniversary of her brother's death and her mother's birthday. She has a past psychiatric history of schizoaffective disorder and reports seeing things and hearing voices in her roommate's closet but she can't make them out.  Past Psychiatric History: Diagnosis:  Schizoaffective disorder  Hospitalizations:  multiple  Outpatient Care:  Substance Abuse Care:  Self-Mutilation:  Cuts her arm  Suicidal Attempts:  States she tried to beat herself to death with her cane   Violent Behaviors:   Past Medical History:   Past Medical History  Diagnosis Date  . Diabetes mellitus   . GERD (gastroesophageal reflux disease)   . Borderline personality disorder   . Schizoaffective disorder   . Depression   . Hyperlipidemia   . Chronic back pain   . Hypothyroidism   . Schizophrenia   . Schizoaffective disorder 04/29/2012  . HTN (hypertension) 04/29/2012  . Bipolar affective disorder 04/29/2012  . Diabetes mellitus 04/29/2012    Allergies:  No Known Allergies PTA Medications: Prescriptions prior to admission  Medication Sig Dispense Refill  . aspirin EC 81 MG tablet Take 81 mg by mouth daily.      . benztropine (COGENTIN) 2 MG tablet Take 2 mg by mouth 2 (two) times daily.      . citalopram (CELEXA) 40 MG tablet Take 40 mg by mouth daily.       . divalproex (DEPAKOTE ER) 500 MG 24 hr tablet Take 1,500 mg by mouth at bedtime.      . docusate sodium (COLACE) 100 MG capsule Take 100 mg by mouth 2 (two) times daily.      Marland Kitchen glipiZIDE (GLUCOTROL) 10 MG tablet Take 10 mg by mouth 2 (two) times daily before a meal.      .  glucose blood test strip 1 each by Other route as needed. Use as instructed      . lisinopril-hydrochlorothiazide (PRINZIDE,ZESTORETIC) 10-12.5 MG per tablet Take 1 tablet by mouth daily.      . meloxicam (MOBIC) 15 MG tablet Take 15 mg by mouth 2 (two) times daily.       . metFORMIN (GLUCOPHAGE) 500 MG tablet Take 500 mg by mouth 2 (two) times daily with a meal.      . omeprazole (PRILOSEC) 20 MG capsule Take 40 mg by mouth daily.       . Oxcarbazepine (TRILEPTAL) 300 MG tablet Take 300 mg by mouth at bedtime.      . paliperidone (INVEGA) 6 MG 24 hr tablet Take 6 mg by mouth at bedtime.      . Paliperidone Palmitate (INVEGA SUSTENNA) 117 MG/0.75ML SUSP Inject 117 mg into the muscle See admin instructions. Every 4 weeks      . polyethylene glycol (MIRALAX / GLYCOLAX) packet Take 17 g by mouth daily.      . simvastatin (ZOCOR) 40 MG tablet Take 40 mg by mouth every evening.      . sitaGLIPtin (JANUVIA) 50 MG tablet Take 50 mg by mouth daily.      . vitamin E (VITAMIN E) 400 UNIT capsule Take 400 Units by mouth daily.      Marland Kitchen zolpidem (AMBIEN) 10 MG tablet  Take 10 mg by mouth at bedtime.        Previous Psychotropic Medications: Seroquel Haldol Risperdal Geodon Abilify Zyprexa Invega sustena  Substance Abuse History in the last 12 months:  Denies Substance Age of 1st Use Last Use Amount Specific Type  Nicotine      Alcohol      Cannabis      Opiates      Cocaine      Methamphetamines      LSD      Ecstasy      Benzodiazepines      Caffeine      Inhalants      Others:                         Consequences of Substance Abuse: N/A   Social History: Current Place of Residence:  [facility] Place of Birth:   Family Members: Marital Status:  Single Children:  Sons:  Daughters: Relationships: Education:  Corporate treasurer Problems/Performance: Religious Beliefs/Practices: History of Abuse (Emotional/Phsycial/Sexual) Occupational Experiences; Military  History:  None. Legal History: Hobbies/Interests:  Family History:  No family history on file. ROS: Negative with the exception of the HPI. PE: Completed by MD on In patient Service. Mental Status Examination/Evaluation: Objective:  Appearance: Disheveled  Eye Contact::  Minimal  Speech:  Slow  Volume:  Decreased  Mood:  Depressed  Affect:  Congruent  Thought Process:  Linear  Orientation:  Full  Thought Content:  Hallucinations: Auditory  Suicidal Thoughts:  Yes.  with intent/plan  Homicidal Thoughts:  Yes.  with intent/plan  Memory:  Immediate;   Fair Recent;   Fair Remote;   Fair  Judgement:  Impaired  Insight:  Lacking  Psychomotor Activity:  Decreased  Concentration:  Poor  Recall:  Poor  Akathisia:  No  Handed:    AIMS (if indicated):     Assets:  Desire for Improvement  Sleep:  Number of Hours: 2     Laboratory/X-Ray Psychological Evaluation(s)      Assessment:    AXIS I:  Schizoaffective disorder with Suicidal and homicidal ideations. AXIS II:  Borderline Personality Dis. AXIS III:   Past Medical History  Diagnosis Date  . Diabetes mellitus   . GERD (gastroesophageal reflux disease)   . Borderline personality disorder   . Schizoaffective disorder   . Depression   . Hyperlipidemia   . Chronic back pain   . Hypothyroidism   . Schizophrenia   . Schizoaffective disorder 04/29/2012  . HTN (hypertension) 04/29/2012  . Bipolar affective disorder 04/29/2012  . Diabetes mellitus 04/29/2012   AXIS IV:  problems with primary support group AXIS V:  51-60 moderate symptoms  Treatment Plan/Recommendations:  Treatment Plan Summary: 1. Admit for crisis management and stabilization. 2. Detox with standard (Librium/Clonidine) protocol. 3. Treat health problems as indicated. 4. Develop treatment plan to decrease risk of relapse upon discharge and the need for readmission. 5. Psycho-social education regarding relapse prevention and self care. 6. Health care follow  up as needed for medical problems.  7. Restarted home medications after discussion with IM/MD who was treating her in patient. 8. Will recheck BMP to follow hyponatremia. 9. Invega systena is to be ordered from IP pharmacy for tonight as she is due for her monthly injection.  Current Medications:  Current Facility-Administered Medications  Medication Dose Route Frequency Provider Last Rate Last Dose  . acetaminophen (TYLENOL) tablet 650 mg  650 mg Oral Q6H PRN Kathi Ludwig  Konrad Saha, MD      . alum & mag hydroxide-simeth (MAALOX/MYLANTA) 200-200-20 MG/5ML suspension 30 mL  30 mL Oral Q4H PRN Cleotis Nipper, MD      . glipiZIDE (GLUCOTROL) tablet 10 mg  10 mg Oral BID AC Cleotis Nipper, MD   10 mg at 05/03/12 0802  . magnesium hydroxide (MILK OF MAGNESIA) suspension 30 mL  30 mL Oral Daily PRN Cleotis Nipper, MD      . metFORMIN (GLUCOPHAGE) tablet 500 mg  500 mg Oral BID WC Cleotis Nipper, MD   500 mg at 05/03/12 0802  . zolpidem (AMBIEN) tablet 10 mg  10 mg Oral QHS PRN Cleotis Nipper, MD   10 mg at 05/03/12 0307   Facility-Administered Medications Ordered in Other Encounters  Medication Dose Route Frequency Provider Last Rate Last Dose  . DISCONTD: 0.9 %  sodium chloride infusion   Intravenous Continuous Rodolph Bong, MD 100 mL/hr at 05/01/12 1652    . DISCONTD: acetaminophen (TYLENOL) suppository 650 mg  650 mg Rectal Q6H PRN Rodolph Bong, MD      . DISCONTD: acetaminophen (TYLENOL) tablet 650 mg  650 mg Oral Q6H PRN Rodolph Bong, MD   650 mg at 04/30/12 1042  . DISCONTD: alum & mag hydroxide-simeth (MAALOX/MYLANTA) 200-200-20 MG/5ML suspension 30 mL  30 mL Oral Q6H PRN Rodolph Bong, MD      . DISCONTD: amLODipine (NORVASC) tablet 10 mg  10 mg Oral Daily Rodolph Bong, MD   10 mg at 05/02/12 1003  . DISCONTD: aspirin EC tablet 81 mg  81 mg Oral Daily Rodolph Bong, MD   81 mg at 05/02/12 1003  . DISCONTD: benztropine (COGENTIN) tablet 2 mg  2 mg Oral BID Rodolph Bong, MD    2 mg at 05/02/12 2133  . DISCONTD: divalproex (DEPAKOTE ER) 24 hr tablet 1,500 mg  1,500 mg Oral QHS Rodolph Bong, MD   1,500 mg at 05/02/12 2133  . DISCONTD: docusate sodium (COLACE) capsule 100 mg  100 mg Oral BID Rodolph Bong, MD   100 mg at 05/02/12 2132  . DISCONTD: enoxaparin (LOVENOX) injection 40 mg  40 mg Subcutaneous Q24H Rodolph Bong, MD   40 mg at 05/02/12 2133  . DISCONTD: insulin aspart (novoLOG) injection 0-20 Units  0-20 Units Subcutaneous TID WC Rodolph Bong, MD   3 Units at 05/02/12 1739  . DISCONTD: levothyroxine (SYNTHROID, LEVOTHROID) tablet 88 mcg  88 mcg Oral QAC breakfast Rodolph Bong, MD   88 mcg at 05/02/12 0753  . DISCONTD: linagliptin (TRADJENTA) tablet 5 mg  5 mg Oral Daily Rodolph Bong, MD   5 mg at 05/02/12 1003  . DISCONTD: LORazepam (ATIVAN) tablet 0.5 mg  0.5 mg Oral Q8H PRN Rodolph Bong, MD   0.5 mg at 05/02/12 1421  . DISCONTD: meloxicam (MOBIC) tablet 15 mg  15 mg Oral BID Rodolph Bong, MD   15 mg at 05/02/12 2132  . DISCONTD: ondansetron (ZOFRAN) injection 4 mg  4 mg Intravenous Q6H PRN Rodolph Bong, MD      . DISCONTD: ondansetron [facility]) tablet 4 mg  4 mg Oral Q6H PRN Rodolph Bong, MD      . DISCONTD: Oxcarbazepine (TRILEPTAL) tablet 300 mg  300 mg Oral QHS Rodolph Bong, MD   300 mg at 05/02/12 2132  . DISCONTD: oxyCODONE (Oxy IR/ROXICODONE) immediate release tablet 5 mg  5  mg Oral Q4H PRN Rodolph Bong, MD   5 mg at 05/01/12 1805  . DISCONTD: paliperidone (INVEGA) 24 hr tablet 6 mg  6 mg Oral QHS Rodolph Bong, MD   6 mg at 05/02/12 2132  . DISCONTD: Paliperidone Palmitate SUSP 117 mg  117 mg Intramuscular See admin instructions Rodolph Bong, MD      . DISCONTD: pantoprazole (PROTONIX) EC tablet 40 mg  40 mg Oral Q1200 Rodolph Bong, MD   40 mg at 05/02/12 1121  . DISCONTD: sodium chloride 0.9 % injection 3 mL  3 mL Intravenous Q12H Rodolph Bong, MD   3 mL at 05/02/12 2133  .  DISCONTD: vitamin E capsule 400 Units  400 Units Oral Daily Rodolph Bong, MD   400 Units at 05/02/12 1003  . DISCONTD: zolpidem (AMBIEN) tablet 10 mg  10 mg Oral QHS Rodolph Bong, MD   10 mg at 05/01/12 2128    Observation Level/Precautions:  1:1 due to suicidal ideation  Laboratory:  BMP for tomorrow  Psychotherapy:    Medications:    Routine PRN Medications:  Yes  Consultations:    Discharge Concerns:    Other:     Lloyd Huger T. Shareena Nusz PAC For Dr. Wonda Cerise 8/17/201311:47 AM

## 2012-05-03 NOTE — [facility] Suicide Risk Assessment (Signed)
Suicide Risk Assessment  Admission Assessment     Demographic factors:  Assessment Details Time of Assessment: Admission Information Obtained From: Patient Current Mental Status:  Current Mental Status: Self-harm thoughts, mood sad, affect ristricted Loss Factors:  Loss Factors: Decline in physical health;Financial problems / change in socioeconomic status Historical Factors:  Historical Factors: Prior suicide attempts;Anniversary of important loss;Impulsivity Risk Reduction Factors:  Risk Reduction Factors: Sense of responsibility to family  CLINICAL FACTORS:   Depression:   Hopelessness  COGNITIVE FEATURES THAT CONTRIBUTE TO RISK:  Loss of executive function    SUICIDE RISK:   Moderate:  Frequent suicidal ideation with limited intensity, and duration, some specificity in terms of plans, no associated intent, good self-control, limited dysphoria/symptomatology, some risk factors present, and identifiable protective factors, including available and accessible social support.  PLAN OF CARE: Axis I: dep d/o nos, psychtoic d/o nos Axis II: Deferred  Axis III:  Past Medical History   Diagnosis  Date   .  Diabetes mellitus    .  GERD (gastroesophageal reflux disease)    .  Borderline personality disorder    .  Schizoaffective disorder    .  Depression    .  Hyperlipidemia    .  Chronic back pain    .  Hypothyroidism    .  Schizophrenia    .  Schizoaffective disorder  04/29/2012   .  HTN (hypertension)  04/29/2012   .  Bipolar affective disorder  04/29/2012   .  Diabetes mellitus  04/29/2012    Axis IV: other psychosocial or environmental problems and problems related to social environment  Axis V: 21-30 behavior considerably influenced by delusions or hallucinations OR serious impairment in judgment, communication OR inability to function in almost all areas    Plan: continue current meds Will check BMP for low Na Will adjust osy meds as needed later Observe for untable  Gait [name] 05/03/2012, 5:12 PM

## 2012-05-04 DIAGNOSIS — F603 Borderline personality disorder: Secondary | ICD-10-CM

## 2012-05-04 DIAGNOSIS — F259 Schizoaffective disorder, unspecified: Secondary | ICD-10-CM

## 2012-05-04 LAB — GLUCOSE, CAPILLARY
Glucose-Capillary: 108 mg/dL — ABNORMAL HIGH (ref 70–99)
Glucose-Capillary: 53 mg/dL — ABNORMAL LOW (ref 70–99)
Glucose-Capillary: 112 mg/dL — ABNORMAL HIGH (ref 70–99)
Glucose-Capillary: 186 mg/dL — ABNORMAL HIGH (ref 70–99)
Glucose-Capillary: 111 mg/dL — ABNORMAL HIGH (ref 70–99)

## 2012-05-04 NOTE — Progress Notes (Signed)
Psychoeducational Group Note  Date:  05/04/2012 Time:  2000  Group Topic/Focus:  Goals Group:   The focus of this group is to help patients establish daily goals to achieve during treatment and discuss how the patient can incorporate goal setting into their daily lives to aide in recovery.  Participation Level:  Active  Participation Quality:  Appropriate and Drowsy  Affect:  Appropriate and Flat  Cognitive:  Appropriate  Insight:  Good  Engagement in Group:  Good  Additional Comments:  Patient states that she has problems dealing with "things in her head", and also stated that she is not getting enough sleep. Patient was able to list two people that she considers as her support system.   Lyndee Hensen 05/04/2012, 8:44 PM

## 2012-05-04 NOTE — Progress Notes (Signed)
Pt is asleep in bed with 1:1 staff at bedside. RR WNL, even and unlabored. Audible snoring overheard. She was up to the bathroom with assist around 2330 and has asked staff to wake her every 2 hours to prevent enuresis which will be completed. 1:1 obs remains in place for pt safety. Pt is safe. Lawrence Marseilles

## 2012-05-04 NOTE — Progress Notes (Signed)
D: Patient is having passive SI but verbally agrees to contract for safety. The patient denies any HI and auditory and visual hallucinations. The patient has a depressed mood and affect. The patient rates her depression a 7 out of 10 and her hopelessness a 5 out 10 (1 low/10 high). The patient reports sleeping well and having a low energy level and an improving appetite. The patient remains on a 1:1 and uses a wheelchair to get around on the unit. The patient denies any pain but did voice a few questions about her diabetic medication (what was it, how often, etc.).  A: Patient given emotional support from RN. Patient encouraged to come to staff with concerns and/or questions. Patient's medication routine continued. Patient's orders and plan of care reviewed. RN gave patient education on her diabetic medication.  R: Patient remains appropriate and cooperative. Will continue to monitor patient q15 minutes for safety. Patient reports no further medication questions.

## 2012-05-04 NOTE — Progress Notes (Signed)
Pt continues to rest without difficulty. RR WNL, even and unlabored. 1:1 staff at bedside Continue 1:1 obs for safety. Pt is safe. [name]

## 2012-05-04 NOTE — Progress Notes (Signed)
[facility] Adult Inpatient Family/Significant Other Suicide Prevention Education  Suicide Prevention Education:  Education Completed; Candy sister [phone], (h) [phone] has been identified by the patient as the family member/significant other with whom the patient will be residing, and identified as the person(s) who will aid the patient in the event of a mental health crisis (suicidal ideations/suicide attempt).  With written consent from the patient, the family member/significant other has been provided the following suicide prevention education, prior to the and/or following the discharge of the patient.  The suicide prevention education provided includes the following:  Suicide risk factors  Suicide prevention and interventions  National Suicide Hotline telephone number  [facility] assessment telephone number  [facility] Emergency Assistance 911  [facility] and/or Residential Mobile Crisis Unit telephone number  Request made of family/significant other to:  Remove weapons (e.g., guns, rifles, knives), all items previously/currently identified as safety concern.    Remove drugs/medications (over-the-counter, prescriptions, illicit drugs), all items previously/currently identified as a safety concern.  The family member/significant other verbalizes understanding of the suicide prevention education information provided.  The family member/significant other agrees to remove the items of safety concern listed above.  Pt. accepted information on suicide prevention, warning signs to look for with suicide and crisis line numbers to use. The pt. agreed to call crisis line numbers if having warning signs or having thoughts of suicide.  Sister reports that she feels the pt is "jellious" that the 2 sisters don't spend enough time with her. Sister explained that both sisters have families that they must care for and come to visit her as often as they can. Sister reports that the  pt.'s struggles got worse when her mother past away and that the sisters cant care for her and that they (the sisters) like the current group home. She reports that there have been many family deaths in August and that each year in August she tends to need to be in the hospital. Sister stated that the pt is fearful of pain and that she does not understand why she wants to harm herself but not feel pain. Sister reports that the pt has been hearing voices for years, before her mother past in 2002.She reports both sisters are not afraid of her or afraid that she might harm her. Sister stated that she "finds it hard to believe that the pt would harm anyone else".  [facility] 05/04/2012, 3:58 PM

## 2012-05-04 NOTE — Progress Notes (Signed)
[facility] Group Notes:  (Counselor/Nursing/MHT/Case Management/Adjunct)  05/04/2012 11 AM  Type of Therapy:  Aftercare Planning, Group Therapy, Dance/Movement Therapy   Participation Level:  Did not attend   Shoshana Johal,Sascha 05/04/2012. 11:44 AM  

## 2012-05-04 NOTE — Progress Notes (Signed)
1:1 Note Patient in day room, finishing up her meal. Sitter is present with patient. The patient denies any concerns or questions at this time. Patient remains safe. Will continue to monitor.

## 2012-05-04 NOTE — [facility] Counselor (Signed)
Adult Comprehensive Assessment  Patient ID: [name], female   DOB: 14-Mar-1956, 56 y.o.   MRN: [id number]  Information Source: Information source: Patient  Current Stressors:  Educational / Learning stressors: NA Employment / Job issues: NA Family Relationships: sister is Surveyor, mining / Lack of resources (include bankruptcy): NA Housing / Lack of housing: likes group home Physical health (include injuries & life threatening diseases): trouble walking Social relationships: NA Substance abuse: past use Bereavement / Loss: NA  Living/Environment/Situation:  Living Arrangements: Other (Comment) (group home) Living conditions (as described by patient or guardian): "good" How long has patient lived in current situation?: 7 years What is atmosphere in current home: Comfortable  Family History:  Marital status: Single Does patient have children?: No  Childhood History:  By whom was/is the patient raised?: Both parents Additional childhood history information: "good" Description of patient's relationship with caregiver when they were a child: mother past in 2006 Patient's description of current relationship with people who raised him/her: NA Does patient have siblings?: Yes Number of Siblings: 5  (2 sisters, 2 brothers, 1 brother past) Description of patient's current relationship with siblings: "good" sisters live in [place] and come to visit often Did patient suffer any verbal/emotional/physical/sexual abuse as a child?: No Did patient suffer from severe childhood neglect?: No Has patient ever been sexually abused/assaulted/raped as an adolescent or adult?: No Was the patient ever a victim of a crime or a disaster?: No Witnessed domestic violence?: No Has patient been effected by domestic violence as an adult?: No  Education:  Highest grade of school patient has completed: some college Currently a Consulting civil engineer?: No Learning disability?: No  Employment/Work Situation:     Employment situation: On disability Why is patient on disability: "mental health" How long has patient been on disability: 40yrs Patient's job has been impacted by current illness: Yes Describe how patient's job has been implacted: can't work What is the longest time patient has a held a job?: 22yrs 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 Has patient ever been in the [employer]?: No Has patient ever served in combat?: No  Financial Resources:   Surveyor, quantity resources: [employer];Food stamps;Medicaid;Medicare Does patient have a representative payee or guardian?: Yes Name of representative payee or guardian: Group home, she wants her sisters to be her guardian  Alcohol/Substance Abuse:   What has been your use of drugs/alcohol within the last 12 months?: 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 If attempted suicide, did drugs/alcohol play a role in this?: No Alcohol/Substance Abuse Treatment Hx: Denies past history If yes, describe treatment: past struggels denies pst tx Has alcohol/substance abuse ever caused legal problems?: No  Social Support System:   Conservation officer, nature Support System: Fair Museum/gallery exhibitions officer System: sisters, group home Type of faith/religion: "morman" How does patient's faith help to cope with current illness?: she prays  Leisure/Recreation:   Leisure and Hobbies: watch tennis and baseball  Strengths/Needs:   What things does the patient do well?: when she was working, worked hard In what areas does patient struggle / problems for patient: struggling to walk, would like to be able to work, [employer] are real telling her to kill herself and "torchure and kill" others.  Discharge Plan:   Does patient have access to transportation?: Yes Will patient be returning to same living situation after discharge?: Yes Currently receiving community mental health services: Yes (From Whom) ([facility]) If no, would patient like  referral  for services when discharged?: No Does patient have financial barriers related to discharge medications?: No  Summary/Recommendations:   Summary and Recommendations (to be completed by the evaluator): Pt reports that she has been depressed and has been having "frequent" thoughts of torturing and killing others. She also thinks about having sex with God "alot" and feels the devil is angry at her and hears the voice of the devil "alot" telling her to "do things she does not want to do" like kill herself. Pt reports that medications have helped with the voices in the past but can't remember what ones helped. Recommendations include crisis stabilization, case management, medication management, psycho-education groups to teach coping skills and group therapy.   [name],[name]. 05/04/2012

## 2012-05-04 NOTE — Progress Notes (Signed)
Psychoeducational Group Note  Date:  05/04/2012 Time:  1000  Group Topic/Focus:  Spirituality:   The focus of this group is to discuss how one's spirituality can aide in recovery.  Participation Level:  Did Not Attend  Participation Quality:  Did Not Attend  Affect:  Did Not Attend  Cognitive:  Did Not Attend  Insight:  Did Not Attend  Engagement in Group:  Did Not Attend  Additional Comments:  Patient did not participate in group. RN allowed patient to rest in her room.  Nestor Ramp [facility] 05/04/2012, 10:58 AM

## 2012-05-04 NOTE — Progress Notes (Addendum)
1:1 Note Patient in room resting. Patient having passive SI but agrees to contract for safety. Sitter currently at patient's bedside. Will continue to monitor patient.

## 2012-05-04 NOTE — Progress Notes (Signed)
Patients CBG was 56 at 1158, taken by MHT. RN was notified. Patient was alert and oriented in no apparent distress. RN offered patient snacks and drink. Patient given meal tray and MD was notified. No new orders were given. Upon reassessment patient's CBG was 112. Will continue to monitor patient.

## 2012-05-04 NOTE — Progress Notes (Signed)
[facility] MD Progress Note                                         05/04/2012    [name] Nov 26, 1955    [id number]      Hospital day #1  Schizoaffective disorder bipolar type Borderline personality disorder Suicidal ideation The patient was seen today and reports the following:  Sleep: good Appetite: good  Mild (1-10) Severe  Depression (1-10):7 Anxiety (1-10): fine Hopelessness (1-10): 0   Suicidal Ideation: The patient denies suicidal ideation. Plan: None Intent: None Means: None  Homicidal Ideation: The patient denies homicidal ideation. Plan: None Intent: None Means: None  Eye Contact:   General Appearance: Behavior:  Cooperative  Motor Behavior:  Speech:  Mental Status:  Orientation x  3 Level of Consciousness:   alert Mood: labile Affect: congruent   Thought Process: Thought Content:  Perception:  Judgment: impaired replying ntht  Norry this mith is too chire." Insight: improving Cognition:  At least average  VS: height is 5\' 5"  (1.651 m) and weight is 112.492 kg (248 lb). Her oral temperature is 97.2 F (36.2 C). Her blood pressure is 131/89 and her pulse is 84. Her respiration is 18.   Current Medication:  . amLODipine  10 mg Oral Daily  . aspirin EC  81 mg Oral Daily  . benztropine  2 mg Oral BID  . docusate sodium  100 mg Oral BID  . glipiZIDE  10 mg Oral BID AC  . insulin aspart  0-20 Units Subcutaneous TID WC  . levothyroxine  88 mcg Oral QAC breakfast  . linagliptin  5 mg Oral Daily  . meloxicam  15 mg Oral BID  . metFORMIN  500 mg Oral BID WC  . Oxcarbazepine  300 mg Oral QHS  . paliperidone  6 mg Oral QHS  . Paliperidone Palmitate  117 mg Intramuscular Q30 days  . pantoprazole  40 mg Oral Q1200  . polyethylene glycol  17 g Oral Daily  . simvastatin  40 mg Oral QPM  . vitamin E  400 Units Oral Daily  . DISCONTD: benztropine  1 mg Oral BID  . DISCONTD: citalopram  40 mg Oral Daily  . DISCONTD: glipiZIDE  10 mg Oral BID AC  .  DISCONTD: hydrochlorothiazide  12.5 mg Oral Daily  . DISCONTD: lisinopril  10 mg Oral Daily  . DISCONTD: lisinopril-hydrochlorothiazide  1 tablet Oral Daily  . DISCONTD: metFORMIN  500 mg Oral BID WC  . DISCONTD: Paliperidone Palmitate  117 mg Intramuscular See admin instructions  . DISCONTD: Paliperidone Palmitate  117 mg Intramuscular See admin instructions    Lab results:  Results for orders placed during the hospital encounter of 05/03/12 (from the past 48 hour(s))  GLUCOSE, CAPILLARY     Status: Abnormal   Collection Time   05/03/12  6:53 AM      Component Value Range Comment   Glucose-Capillary 112 (*) 70 - 99 mg/dL    Comment 1 Notify RN     GLUCOSE, CAPILLARY     Status: Abnormal   Collection Time   05/03/12 11:41 AM      Component Value Range Comment   Glucose-Capillary 107 (*) 70 - 99 mg/dL    Comment 1 Notify RN     GLUCOSE, CAPILLARY     Status: Abnormal   Collection Time   05/03/12  5:13  PM      Component Value Range Comment   Glucose-Capillary 153 (*) 70 - 99 mg/dL    Comment 1 Notify RN     BASIC METABOLIC PANEL     Status: Abnormal   Collection Time   05/03/12  7:32 PM      Component Value Range Comment   Sodium 131 (*) 135 - 145 mEq/L    Potassium 4.5  3.5 - 5.1 mEq/L    Chloride 97  96 - 112 mEq/L    CO2 28  19 - 32 mEq/L    Glucose, Bld 136 (*) 70 - 99 mg/dL    BUN 16  6 - 23 mg/dL    Creatinine, Ser 1.61  0.50 - 1.10 mg/dL    Calcium 9.2  8.4 - 09.6 mg/dL    GFR calc non Af Amer 59 (*) >90 mL/min    GFR calc Af Amer 68 (*) >90 mL/min   GLUCOSE, CAPILLARY     Status: Normal   Collection Time   05/03/12  8:57 PM      Component Value Range Comment   Glucose-Capillary 88  70 - 99 mg/dL   GLUCOSE, CAPILLARY     Status: Abnormal   Collection Time   05/04/12  6:26 AM      Component Value Range Comment   Glucose-Capillary 108 (*) 70 - 99 mg/dL   GLUCOSE, CAPILLARY     Status: Abnormal   Collection Time   05/04/12 11:58 AM      Component Value Range Comment     Glucose-Capillary 53 (*) 70 - 99 mg/dL      No results found for this or any previous visit for  Last 48 hours.   ROS:    Constitutional: WDWN Adult in NAD   GI: Negative for N,V,D,C   Neuro: Negative for dizziness, blurred vision, visual changes,headaches   Resp: Negative for wheezing, SOB, cough   Cardio: Negative for CP, diaphoresis, fatigue   MSK: Negative for joint pain, swelling, DROM, or ambulatory difficulties.  Time was spent with the patient discussing the current symptoms and the response to treatment.  Treatment Summary:  Treatment Plan: 1.  Request  Records from [facility] care. 2. Consider Abilify as the patient states it works better.  Rona Ravens. Franko Hilliker [facility] 05/04/2012

## 2012-05-05 DIAGNOSIS — F200 Paranoid schizophrenia: Secondary | ICD-10-CM | POA: Diagnosis present

## 2012-05-05 LAB — GLUCOSE, CAPILLARY
Glucose-Capillary: 107 mg/dL — ABNORMAL HIGH (ref 70–99)
Glucose-Capillary: 82 mg/dL (ref 70–99)
Glucose-Capillary: 110 mg/dL — ABNORMAL HIGH (ref 70–99)
Glucose-Capillary: 137 mg/dL — ABNORMAL HIGH (ref 70–99)

## 2012-05-05 MED ORDER — BENZTROPINE MESYLATE 2 MG PO TABS
2.0000 mg | ORAL_TABLET | ORAL | Status: DC
  Administered 2012-05-05 – 2012-05-15 (×20): 2 mg via ORAL
  Filled 2012-05-05 (×22): qty 1

## 2012-05-05 MED ORDER — DOCUSATE SODIUM 100 MG PO CAPS
100.0000 mg | ORAL_CAPSULE | ORAL | Status: DC
  Administered 2012-05-05 – 2012-05-15 (×20): 100 mg via ORAL
  Filled 2012-05-05 (×22): qty 1

## 2012-05-05 MED ORDER — PANTOPRAZOLE SODIUM 40 MG PO TBEC
40.0000 mg | DELAYED_RELEASE_TABLET | Freq: Every day | ORAL | Status: DC
  Administered 2012-05-06 – 2012-05-14 (×9): 40 mg via ORAL
  Filled 2012-05-05 (×10): qty 1

## 2012-05-05 MED ORDER — PALIPERIDONE PALMITATE 117 MG/0.75ML IM SUSP
117.0000 mg | INTRAMUSCULAR | Status: DC
  Administered 2012-05-06: 117 mg via INTRAMUSCULAR
  Filled 2012-05-05: qty 0.75

## 2012-05-05 MED ORDER — SIMVASTATIN 40 MG PO TABS
40.0000 mg | ORAL_TABLET | Freq: Every day | ORAL | Status: DC
  Administered 2012-05-05 – 2012-05-14 (×10): 40 mg via ORAL
  Filled 2012-05-05 (×11): qty 1

## 2012-05-05 MED ORDER — PALIPERIDONE ER 3 MG PO TB24
9.0000 mg | ORAL_TABLET | Freq: Every day | ORAL | Status: DC
  Administered 2012-05-05 – 2012-05-14 (×10): 9 mg via ORAL
  Filled 2012-05-05 (×11): qty 3

## 2012-05-05 NOTE — Progress Notes (Signed)
[facility] MD Progress Note  05/05/2012 1:47 PM  Diagnosis:  Axis I: Schizophrenia - Paranoid Type.   The patient was seen today and reports the following:   ADL's: Intact.  Sleep: The patient reports to sleeping well last night but according to staff she slept only 5 hours.  Appetite: The patient reports that her appetite is good.   Mild>(1-10) >Severe  Hopelessness (1-10): 0  Depression (1-10): 5  Anxiety (1-10): 7   Suicidal Ideation: The patient reports ongoing suicidal ideations with both plan and possible intent. Plan: Possible  Intent: Possible  Means: No   Homicidal Ideation: The patient reports a desire to "tied women up and torture them by cutting of their breasts."  Plan: No  Intent: No.  Means: No   General Appearance/Behavior: The patient was friendly and cooperative with the treatment team this morning. Eye Contact: Good.  Speech: Appropriate in rate and volume with no pressuring noted today.  Motor Behavior: wnl.  Level of Consciousness: Alert and Oriented x 3.  Mental Status: Alert and Oriented x 3.  Mood: Appears moderately depressed.  Affect: Moderately constricted.  Anxiety Level: Moderate anxiety reported today.  Thought Process: Psychotic today.  Thought Content: The patient reports auditory hallucinations instructing her to kill herself.  She also has delusions of wanting to have sex with GOD and that demons are attempting to have sex with her. Perception: Psychotic today.  Judgment: Poor.  Insight: Poor.  Cognition: Oriented to person, place and time.  Sleep:  Number of Hours: 6    Vital Signs:Blood pressure 119/80, pulse 91, temperature 98 F (36.7 C), temperature source Oral, resp. rate 16, height 5\' 5"  (1.651 m), weight 112.492 kg (248 lb).  Current Medications: Current Facility-Administered Medications  Medication Dose Route Frequency Provider Last Rate Last Dose  . acetaminophen (TYLENOL) tablet 650 mg  650 mg Oral Q6H PRN Cleotis Nipper, MD        . alum & mag hydroxide-simeth (MAALOX/MYLANTA) 200-200-20 MG/5ML suspension 30 mL  30 mL Oral Q4H PRN Cleotis Nipper, MD      . amLODipine (NORVASC) tablet 10 mg  10 mg Oral Daily Curlene Labrum Boby Eyer, MD   10 mg at 05/05/12 0808  . aspirin EC tablet 81 mg  81 mg Oral Daily Curlene Labrum Tayjon Halladay, MD   81 mg at 05/05/12 0806  . benztropine (COGENTIN) tablet 2 mg  2 mg Oral BH-qamhs Nishan Ovens D Siri Buege, MD      . docusate sodium (COLACE) capsule 100 mg  100 mg Oral BH-qamhs Hilarie Sinha D Merric Yost, MD      . glipiZIDE (GLUCOTROL) tablet 10 mg  10 mg Oral BID AC Curlene Labrum Jaicey Sweaney, MD   10 mg at 05/05/12 0643  . insulin aspart (novoLOG) injection 0-20 Units  0-20 Units Subcutaneous TID WC Ronny Bacon, MD   4 Units at 05/04/12 1724  . levothyroxine (SYNTHROID, LEVOTHROID) tablet 88 mcg  88 mcg Oral QAC breakfast Curlene Labrum Saphyra Hutt, MD   88 mcg at 05/05/12 0643  . linagliptin (TRADJENTA) tablet 5 mg  5 mg Oral Daily Curlene Labrum Priya Matsen, MD   5 mg at 05/05/12 0809  . LORazepam (ATIVAN) tablet 0.5 mg  0.5 mg Oral BID PRN Verne Spurr, PA-C   0.5 mg at 05/04/12 1836  . magnesium hydroxide (MILK OF MAGNESIA) suspension 30 mL  30 mL Oral Daily PRN Cleotis Nipper, MD      . meloxicam (MOBIC) tablet 15 mg  15 mg Oral  BID Curlene Labrum Tyleek Smick, MD   15 mg at 05/05/12 4782  . metFORMIN (GLUCOPHAGE) tablet 500 mg  500 mg Oral BID WC Curlene Labrum Adelaine Roppolo, MD   500 mg at 05/05/12 0810  . Oxcarbazepine (TRILEPTAL) tablet 300 mg  300 mg Oral QHS Curlene Labrum Ashaunti Treptow, MD   300 mg at 05/04/12 2134  . paliperidone (INVEGA) 24 hr tablet 9 mg  9 mg Oral QHS Curlene Labrum Adiya Selmer, MD      . Paliperidone Palmitate SUSP 117 mg  117 mg Intramuscular Q30 days Curlene Labrum Demitrios Molyneux, MD      . pantoprazole (PROTONIX) EC tablet 40 mg  40 mg Oral QHS Sanjuanita Condrey D Shirel Mallis, MD      . polyethylene glycol (MIRALAX / GLYCOLAX) packet 17 g  17 g Oral Daily Curlene Labrum Dowell Hoon, MD      . simvastatin (ZOCOR) tablet 40 mg  40 mg Oral QHS Curlene Labrum Kadance Mccuistion, MD      . vitamin E capsule 400  Units  400 Units Oral Daily Ronny Bacon, MD   400 Units at 05/05/12 0810  . DISCONTD: benztropine (COGENTIN) tablet 2 mg  2 mg Oral BID Verne Spurr, PA-C   2 mg at 05/05/12 0809  . DISCONTD: docusate sodium (COLACE) capsule 100 mg  100 mg Oral BID Verne Spurr, PA-C   100 mg at 05/05/12 0809  . DISCONTD: paliperidone (INVEGA) 24 hr tablet 6 mg  6 mg Oral QHS Curlene Labrum Azariel Banik, MD   6 mg at 05/04/12 2134  . DISCONTD: pantoprazole (PROTONIX) EC tablet 40 mg  40 mg Oral Q1200 Verne Spurr, PA-C   40 mg at 05/05/12 1204  . DISCONTD: simvastatin (ZOCOR) tablet 40 mg  40 mg Oral QPM Verne Spurr, PA-C   40 mg at 05/04/12 1720   Lab Results:  Results for orders placed during the hospital encounter of 05/03/12 (from the past 48 hour(s))  GLUCOSE, CAPILLARY     Status: Abnormal   Collection Time   05/03/12  5:13 PM      Component Value Range Comment   Glucose-Capillary 153 (*) 70 - 99 mg/dL    Comment 1 Notify RN     BASIC METABOLIC PANEL     Status: Abnormal   Collection Time   05/03/12  7:32 PM      Component Value Range Comment   Sodium 131 (*) 135 - 145 mEq/L    Potassium 4.5  3.5 - 5.1 mEq/L    Chloride 97  96 - 112 mEq/L    CO2 28  19 - 32 mEq/L    Glucose, Bld 136 (*) 70 - 99 mg/dL    BUN 16  6 - 23 mg/dL    Creatinine, Ser 9.56  0.50 - 1.10 mg/dL    Calcium 9.2  8.4 - 21.3 mg/dL    GFR calc non Af Amer 59 (*) >90 mL/min    GFR calc Af Amer 68 (*) >90 mL/min   GLUCOSE, CAPILLARY     Status: Normal   Collection Time   05/03/12  8:57 PM      Component Value Range Comment   Glucose-Capillary 88  70 - 99 mg/dL   GLUCOSE, CAPILLARY     Status: Abnormal   Collection Time   05/04/12  6:26 AM      Component Value Range Comment   Glucose-Capillary 108 (*) 70 - 99 mg/dL   GLUCOSE, CAPILLARY     Status: Abnormal   Collection Time  05/04/12 11:58 AM      Component Value Range Comment   Glucose-Capillary 53 (*) 70 - 99 mg/dL   GLUCOSE, CAPILLARY     Status: Abnormal   Collection  Time   05/04/12  1:04 PM      Component Value Range Comment   Glucose-Capillary 112 (*) 70 - 99 mg/dL    Comment 1 Documented in Chart      Comment 2 Notify RN     GLUCOSE, CAPILLARY     Status: Abnormal   Collection Time   05/04/12  4:50 PM      Component Value Range Comment   Glucose-Capillary 186 (*) 70 - 99 mg/dL    Comment 1 Notify RN     GLUCOSE, CAPILLARY     Status: Abnormal   Collection Time   05/04/12  8:29 PM      Component Value Range Comment   Glucose-Capillary 111 (*) 70 - 99 mg/dL    Comment 1 Notify RN     GLUCOSE, CAPILLARY     Status: Abnormal   Collection Time   05/05/12  6:16 AM      Component Value Range Comment   Glucose-Capillary 107 (*) 70 - 99 mg/dL   GLUCOSE, CAPILLARY     Status: Normal   Collection Time   05/05/12 11:50 AM      Component Value Range Comment   Glucose-Capillary 82  70 - 99 mg/dL    Comment 1 Notify RN      Physical Findings: AIMS: Facial and Oral Movements Muscles of Facial Expression: None, normal Lips and Perioral Area: Mild Jaw: None, normal Tongue: Mild,Extremity Movements Upper (arms, wrists, hands, fingers): None, normal Lower (legs, knees, ankles, toes): None, normal, Trunk Movements Neck, shoulders, hips: None, normal, Overall Severity Severity of abnormal movements (highest score from questions above): Minimal,    CIWA:  CIWA-Ar Total: 3  COWS:  COWS Total Score: 2   Review of Systems:  Neurological: The patient denies any headaches today. She denies any seizures or dizziness.  G.I.: The patient denies any constipation today or G.I. Upset.  Musculoskeletal: The patient denies any muscle or skeletal difficulties.   Time was spent today discussing with the patient her current symptoms.  The patient reports to sleeping reasonably well and reports a good appetite.  The patient report moderate feelings of sadness, anhedonia and depressed mood.  She reports constant suicidal ideations with plan and possible intent and cannot  contract for safety today.  The patient reports moderate to severe anxiety symptoms.  She also reports to hearing voices instructing her to kill herself as well as thoughts of "torturing women by cutting their breasts off" as well as delusions of wanting to have sex with GOD and that demons are attempting to have sex with her. She denies any medication related side effects today.  Treatment Plan Summary:  1. Daily contact with patient to assess and evaluate symptoms and progress in treatment.  2. Medication management  3. The patient will deny suicidal ideations or homicidal ideations for 48 hours prior to discharge and have a depression and anxiety rating of 3 or less. The patient will also deny any auditory or visual hallucinations or delusional thinking.  4. The patient will deny any symptoms of substance withdrawal at time of discharge.   Plan:  1. Will increase the medication Invega to 9 mgs po qhs to further address her psychotic symptoms. 2. Will continue the medication Paliperidone Palmitate 115 mgs IM  q 30 days with the injection due today. 3. Will continue the medication Trileptal at 300 mgs po qhs for mood stabilization. 4. Will continue the medication Cogentin at 2 mgs po q am and hs for EPS. 5. Will continue the patient on her non-psychiatric medications as listed above. 6. Laboratory studies reviewed.  7. Will continue to monitor.  8. Efforts are being made to obtain the patient's injection where it can be administered.  Maddyx Vallie 05/05/2012, 1:47 PM

## 2012-05-05 NOTE — Progress Notes (Signed)
05/05/2012         Time: 0930      Group Topic/Focus: The focus of this group is on promoting emotional and psychological well-being through the process of creative expression, relaxation, socialization, fun and enjoyment.  Participation Level: Active  Participation Quality: Attentive  Affect: Blunted  Cognitive: Alert   Additional Comments: Patient making sexual comments, appeared fixated on ovulation.   [name] 05/05/2012 10:09 AM

## 2012-05-05 NOTE — Progress Notes (Signed)
[facility] Group Notes:  (Counselor/Nursing/MHT/Case Management/Adjunct)  05/05/2012 3:45 PM  Type of Therapy:  Group Therapy  Participation Level:  Active  Participation Quality:  Attentive and Sharing  Affect:  Depressed  Cognitive:  Oriented  Insight:  Good  Engagement in Group:  Good  Engagement in Therapy:  Good  Modes of Intervention:  Education, Problem-solving and Support  Summary of Progress/Problems: Patient was active in discussion of wellness. She stated that she tended to think negatively and saw how this pulled her down further into suicidal thoughts. She also talked about how when she uses drugs and alcohol and tries not to think of them, it just makes things worse. She was able to identify some positive coping including using her family as support.   [name] 05/05/2012, 3:45 PM

## 2012-05-05 NOTE — Tx Team (Signed)
Interdisciplinary Treatment Plan Update (Adult)  Date:  05/05/2012  Time Reviewed:  11:23 AM   Progress in Treatment: Attending groups: Yes Participating in groups:  Yes Taking medication as prescribed: Yes Tolerating medication:  Yes Family/Significant othe contact made: Counselor assessing for appropriate contact Patient understands diagnosis:  Yes Discussing patient identified problems/goals with staff:  Yes Medical problems stabilized or resolved:  Yes Denies suicidal/homicidal ideation: Yes Issues/concerns per patient self-inventory:  None identified Other: N/A  New problem(s) identified: None Identified  Reason for Continuation of Hospitalization: Anxiety Depression Hallucinations Homicidal ideation Medication stabilization Suicidal ideation   Interventions implemented related to continuation of hospitalization: mood stabilization, medication monitoring and adjustment, group therapy and psycho education, safety checks q 15 mins  Additional comments: N/A  Estimated length of stay: 3-5 days  Discharge Plan: SW is assessing for appropriate referrals.   New goal(s): N/A  Review of initial/current patient goals per problem list:   1.  Goal(s): Reduce depressive and anxiety symptoms  Met:  No  Target date: by discharge  As evidenced by: Reducing depression from a 10 to a 3 as reported by pt.   2.  Goal (s): Eliminate Suicidal Ideation/Homicidal Ideation  Met:  No  Target date: by discharge  As evidenced by: Eliminate suicidal ideation/homicidal ideation.   3.  Goal(s): Reduce auditory hallucinations  Met:  No  Target date: by discharge  As evidenced by: Reduce voices to baseline, as reported by pt.      Attendees: Patient:  [name] 05/05/2012 11:26 AM   Family:     Physician:  Franchot Gallo, MD 05/05/2012 11:23 AM   Nursing:   Joslyn Devon, RN 05/05/2012 11:24 AM   Case Manager:  Reyes Ivan, LCSWA 05/05/2012 11:23 AM   Counselor:   Veto Kemps, MT-BC 05/05/2012 11:23 AM   Other: Clarice Pole, LCASA 05/05/2012 11:23 AM   Other:  Quintella Reichert, RN 05/05/2012 11:30 AM   Other:     Other:      Scribe for Treatment Team:   Carmina Miller, 05/05/2012, 11:23 AM

## 2012-05-05 NOTE — Progress Notes (Signed)
Pt attended discharge planning group and actively participated in group.  SW provided pt with today's workbook.  Pt yelled "shut up" in the middle of group.  Pt rates depression at a 5 and anxiety at a 7 today.  Pt endorses SI and HI.  Pt states that she is hearing voices that are telling her to kill herself.  Pt states that she doesn't trust herself to not act on killing herself which is why pt remains on 1:1.  Pt states that she is also having thoughts of tying women up and cutting their breast off.  Pt states that she has thoughts of torturing people and killing them, but no one in particular.  Pt states that she is having a hard time to focus and is confused.  Pt states that she also has fantasies to have sex with God and feels like evil spirits are trying to have sex with her.  Pt states that she is followed by [facility].  SW will contact them today to get pt's MAR.  Pt states that she lives in [facility] group home.  SW will secure pt's follow up.  No further needs voiced by pt at this time.    Reyes Ivan, LCSWA 05/05/2012  11:34 AM    Per State Regulation 482.30 This Chart was reviewed for medical necessity with respect to the patient's Admission/Duration of stay.   Carmina Miller  05/05/2012  Next Review Date:  05/07/12

## 2012-05-05 NOTE — Progress Notes (Addendum)
0730 Patient has 1:1 present.   Patient sitting in wheelchair in dayroom with 1:1 present.  Patient safety maintained.   Patient denied HI.    Denied visual hallucinations.  Stated [name] does hear voices telling her to cut herself and to stick her head in boiling water.   Stated [name] would not hurt herself at [facility]  Contracts for safety this morning.  Patient stated [name] cannot concentrate, feels confused.  Denied pain this morning.   1:1 continues per MD order.  Patient has been cooperative and pleasant.   1610  Patient attended discharge planning group this morning.  Patient yelled "shut up" in group, no one was speaking to her.  Patient stated [name] is hearing voices in her closet, mumblings.  "Kill myself and stuff like that".  "Don't trust myself."  Stated her fantasies are having "sex with God, evil spirits having sex with me, voices are real".  Stated [name] saw a therapist while at [facility].  Denied SI while talking to nurse.  Contracted for safety.  1:1 continues per MD order.  Patient continues to wheelchair with 1:1 present. [id number]   Patient's self inventory sheet, patient sleeps well, good appetite, low energy level, poor attention span.  Rated depression #5.  Denied hopelessness.  Denied withdrawals.   Admits SI.  Contracts for safety.  Has felt dizzy, "cannot walk".  Denied pain goal.  After discharge, plans to take meds on time.  No questions for staff.   1100  Patient denied SI at this time.   Contracts for safety.   1:1 continues per MD order.  Denied HI.   Stated [name] is still hearing voices to hurt herself.   Denied visual hallucinations.   Denied pain.  Patient has attended group meetings in dayroom today.   Patient in wheelchair with 1:1.   Patient has been cooperative and pleasant.  No signs of pain or distress noted.  Patient's safety maintained.  1500  Patient in her room with 1:1 present.  Patient stated [name] still hears voices telling her to hurt herself.  Denied visual hallucinations.  Contracts for  safety.   Denied pain.  Has been to all groups today and participating in groups.  Patient stated [name] will have visitors tonight.  Visitors may bring walker from group home when they come.  Would like to use walker tomorrow after talking to MD.   Patient remains safe with 1:1 present.  No signs of pain or distress noted on patient's face or body movements.  1:1 continue per MD order.  1745   Patient eating dinner in dayroom with 1:1 present, sitting in wheelchair.  Patient stated [name] still hears voices telling her to hurt herself.  Patient denied visual hallucinations.  Patient continues to contract for safety with 1:1 present.  Patient's CBG at 1700 was 110, no insulin required.  Patient responds to all questions and has attended groups today.  Patient has been cooperative and pleasant.  No signs of pain or distress noted on patient's face or body movements.  1:1 to continue for patient's safety.    1830   Patient's visitors are talking in her room.   1:1 present.  Patient sitting in her wheelchair.   Safety continues to be maintained with 1:1 present.  Patient comfortable.   No signs or pain or distress noted on patient's face or body movements.   1:1 continues per MD order.

## 2012-05-05 NOTE — Progress Notes (Signed)
Pt remains on 1:1 precaution with HCT close by at bedside. Pt presently resting well, resp even and unlabored, no signs of distress. Pt has rested well throughout this night shift and remains safe on unit. No attempts made to harm self.

## 2012-05-05 NOTE — Progress Notes (Signed)
Pt continues on 1:1 precaution with HCT at bedside. Pt continues to rest well and resp are even and unlabored. No acute distress, no attempts to harm self and remains safe on unit.

## 2012-05-06 DIAGNOSIS — F200 Paranoid schizophrenia: Principal | ICD-10-CM

## 2012-05-06 LAB — GLUCOSE, CAPILLARY
Glucose-Capillary: 108 mg/dL — ABNORMAL HIGH (ref 70–99)
Glucose-Capillary: 105 mg/dL — ABNORMAL HIGH (ref 70–99)
Glucose-Capillary: 139 mg/dL — ABNORMAL HIGH (ref 70–99)
Glucose-Capillary: 90 mg/dL (ref 70–99)

## 2012-05-06 MED ORDER — TRAZODONE HCL 50 MG PO TABS
150.0000 mg | ORAL_TABLET | Freq: Every evening | ORAL | Status: DC | PRN
  Administered 2012-05-07: 150 mg via ORAL
  Filled 2012-05-06: qty 1

## 2012-05-06 MED ORDER — TRAZODONE HCL 50 MG PO TABS
150.0000 mg | ORAL_TABLET | Freq: Every day | ORAL | Status: DC
  Filled 2012-05-06: qty 3

## 2012-05-06 MED ORDER — SERTRALINE HCL 50 MG PO TABS
50.0000 mg | ORAL_TABLET | Freq: Every day | ORAL | Status: DC
  Administered 2012-05-06 – 2012-05-07 (×2): 50 mg via ORAL
  Filled 2012-05-06 (×3): qty 1

## 2012-05-06 NOTE — Progress Notes (Signed)
[facility] Group Notes:  (Counselor/Nursing/MHT/Case Management/Adjunct)  05/06/2012 3:10 PM  Type of Therapy:  Group Therapy  Participation Level:  Minimal  Participation Quality:  Attentive  Affect:  Flat  Cognitive:  Confused  Insight:  None  Engagement in Group:  Limited  Engagement in Therapy:  Limited  Modes of Intervention:  Exploration  Summary of Progress/Problems: Patient was more subdued today. When questioned, she stated that she didn't want to talk and that she was confused. Patient was distracted and kept talking to her 1:1 staff.   Shikara Mcauliffe, Aram Beecham 05/06/2012, 3:10 PM

## 2012-05-06 NOTE — Progress Notes (Signed)
Psychoeducational Group Note  Date:  05/06/2012 Time:  1100  Group Topic/Focus:  Recovery Goals:   The focus of this group is to identify appropriate goals for recovery and establish a plan to achieve them.  Participation Level: Did Not Attend  Participation Quality:  Not Applicable  Affect:  Not Applicable  Cognitive:  Not Applicable  Insight:  Not Applicable  Engagement in Group: Not Applicable  Additional Comments:  Pt was unable to attend group this morning.  Flay Ghosh E 05/06/2012, 2:55 PM

## 2012-05-06 NOTE — Progress Notes (Addendum)
0730  Patient's self inventory sheet, patient has poor sleep, good appetite, low energy level, poor attention span.    Rated depression #8, hopelessness #7.  Denied withdrawals.  SI with plans to harm herself.   Refused to contract for safety.  Feels like she has a "knot in her stomach".  Worst pain #3.  Plans to lose weight after discharge.  "Why am I here?  Don't like giving out information to strangers.   Would like privacy."    No discharge plans.  Will have problems taking meds after discharge. 1:1 continues for safety.  Patient ate breakfast in her room while sitting in wheelchair. 0830  Patient laying in her bed with 1:1 present.   Stated she wants to be left alone today.   Refused to come to medication window.  Refused miralax stating she did not need that medication.  Patient stated she does hear voices telling her to hurt herself.   Refused to contract for safety.  1:1 continues per MD order.  Denied visual hallucinations.  Denied HI.  Denied pain.   Patient has bruise on left side of abdomen.  No signs of pain or distress noted on patient's face or body movements.   Patient remains safe on unit with 1:1 present.  Will continue to monitor for safety.   1200  Patient sitting in wheelchair in her room eating her lunch.  Patient still hearing voices telling her to hurt herself, cannot contract for safety.  Wants to leave facility asap.   Went to one group this morning.  Denied visual hallucinations.   Denied HI.   Denied pain.  Continues with 1:1 for safety.   Safety maintained.  No signs of pain/distress noted on patient's face or body movements.  1:1 with patient. 1420  Patient resting in bed with 1:1 present.  SI, hearing voices to hurt herself.  Clinical research associate for safety.  Denied HI.  Will continue to monitor for safety with 1:1.  No signs of distress or pain noted on patient's face or body movements. 1600  Patient sleeping in her bed with 1:1 present.   Respirations even and unlabored.  No signs of  pain or distress noted on patient's face.  Will continue to monitor with 1:1 for safety. 1900   Patient laying in bed with eyes opened talking to nurse.  She knows if she tries to hurt herself that someone will try to stop her.   Clinical research associate for safety.  Voices are telling her to hurt or kill herself.  Has heard these negative voices for several years.   Bad thoughts causes the voices to start.   Denied visual hallucinations.  Feels that she cannot distinguish things that are real or unreal.   Denied HI thoughts at this time.  Pt has used wheelchair when out of bed today.  Did not attend any afternoon groups today.  Ate approximately 90% of dinner tonight.  1:1 continues per MD order.  Feels she is confused and does not understand why she is here in hospital.  Will continue to monitor patient's safety with 1:1 per MD order.

## 2012-05-06 NOTE — Progress Notes (Signed)
Pt did not attend d/c planning group on this date.  SW met with pt individually at this time.  Pt was resting in bed.  Pt states that she is not doing well today.  Pt rates depression and anxiety at a 9 today.  Pt denies HI but endorses SI.  Pt continues to be on a 1:1 for safety.  Pt states that she continues to hear voices that tell her to kill herself.  Pt will follow up with [facility] team.  SW contacted [facility].  The worker on duty states that she was the one that had to call for help.  The worker states that pt is able to return to the home when she is stable.  No further needs voiced by pt at this time.    Reyes Ivan, LCSWA 05/06/2012  12:14 PM

## 2012-05-06 NOTE — Progress Notes (Signed)
[facility] MD Progress Note  05/06/2012 3:08 PM  Diagnosis:  Axis I: Schizophrenia - Paranoid Type.   The patient was seen today and reports the following:   ADL's: Intact.  Sleep: The patient reports to having some difficulty initiating and maintaining sleep.  Appetite: The patient reports that her appetite is good.   Mild>(1-10) >Severe  Hopelessness (1-10): 9  Depression (1-10): 9  Anxiety (1-10): 9   Suicidal Ideation: The patient reports ongoing suicidal ideations with both plan and possible intent.  Plan: Possible  Intent: Possible  Means: No   Homicidal Ideation: The patient denies any homicidal ideations today. Plan: No  Intent: No.  Means: No   General Appearance/Behavior: The patient was cooperative today with the treatment team.  Eye Contact: Good.  Speech: Appropriate in rate and volume with no pressuring noted today.  Motor Behavior: wnl.  Level of Consciousness: Alert and Oriented x 3.  Mental Status: Alert and Oriented x 3.  Mood: Appears severely depressed.  Affect: Essentially flat.  Anxiety Level: Severe anxiety reported today.  Thought Process: Psychotic today.  Thought Content: The patient reports auditory hallucinations instructing her to kill herself. She denies any visual hallucinations or delusional thinking today. Perception: Psychotic today.  Judgment: Poor.  Insight: Poor.  Cognition: Oriented to person, place and time.  Sleep:  Number of Hours: 2.75    Vital Signs:Blood pressure 120/82, pulse 101, temperature 97.5 F (36.4 C), temperature source Oral, resp. rate 18, height 5\' 5"  (1.651 m), weight 112.492 kg (248 lb).  Current Medications: Current Facility-Administered Medications  Medication Dose Route Frequency Provider Last Rate Last Dose  . acetaminophen (TYLENOL) tablet 650 mg  650 mg Oral Q6H PRN Cleotis Nipper, MD      . alum & mag hydroxide-simeth (MAALOX/MYLANTA) 200-200-20 MG/5ML suspension 30 mL  30 mL Oral Q4H PRN Cleotis Nipper, MD       . amLODipine (NORVASC) tablet 10 mg  10 mg Oral Daily Curlene Labrum Sukhman Martine, MD   10 mg at 05/06/12 0823  . aspirin EC tablet 81 mg  81 mg Oral Daily Curlene Labrum Lakara Weiland, MD   81 mg at 05/06/12 0824  . benztropine (COGENTIN) tablet 2 mg  2 mg Oral BH-qamhs Curlene Labrum Arkel Cartwright, MD   2 mg at 05/06/12 0824  . docusate sodium (COLACE) capsule 100 mg  100 mg Oral BH-qamhs Curlene Labrum Keishawn Rajewski, MD   100 mg at 05/06/12 0824  . glipiZIDE (GLUCOTROL) tablet 10 mg  10 mg Oral BID AC Curlene Labrum Araina Butrick, MD   10 mg at 05/06/12 0636  . insulin aspart (novoLOG) injection 0-20 Units  0-20 Units Subcutaneous TID WC Ronny Bacon, MD   4 Units at 05/04/12 1724  . levothyroxine (SYNTHROID, LEVOTHROID) tablet 88 mcg  88 mcg Oral QAC breakfast Curlene Labrum Szymon Foiles, MD   88 mcg at 05/06/12 0636  . linagliptin (TRADJENTA) tablet 5 mg  5 mg Oral Daily Curlene Labrum Kaleo Condrey, MD   5 mg at 05/06/12 0825  . LORazepam (ATIVAN) tablet 0.5 mg  0.5 mg Oral BID PRN Verne Spurr, PA-C   0.5 mg at 05/06/12 0121  . magnesium hydroxide (MILK OF MAGNESIA) suspension 30 mL  30 mL Oral Daily PRN Cleotis Nipper, MD      . meloxicam (MOBIC) tablet 15 mg  15 mg Oral BID Curlene Labrum Merlie Noga, MD   15 mg at 05/06/12 0825  . metFORMIN (GLUCOPHAGE) tablet 500 mg  500 mg Oral BID WC Harvie Heck  D Shakil Dirk, MD   500 mg at 05/06/12 0826  . Oxcarbazepine (TRILEPTAL) tablet 300 mg  300 mg Oral QHS Curlene Labrum Anijah Spohr, MD   300 mg at 05/05/12 2156  . paliperidone (INVEGA) 24 hr tablet 9 mg  9 mg Oral QHS Curlene Labrum Kitti Mcclish, MD   9 mg at 05/05/12 2156  . Paliperidone Palmitate SUSP 117 mg  117 mg Intramuscular Q30 days Ronny Bacon, MD   117 mg at 05/06/12 1153  . pantoprazole (PROTONIX) EC tablet 40 mg  40 mg Oral QHS Raymona Boss D Ivy Meriwether, MD      . polyethylene glycol (MIRALAX / GLYCOLAX) packet 17 g  17 g Oral Daily Curlene Labrum Eh Sesay, MD      . sertraline (ZOLOFT) tablet 50 mg  50 mg Oral Daily Curlene Labrum Calea Hribar, MD      . simvastatin (ZOCOR) tablet 40 mg  40 mg Oral QHS Curlene Labrum  Luisfernando Brightwell, MD   40 mg at 05/05/12 2156  . vitamin E capsule 400 Units  400 Units Oral Daily Ronny Bacon, MD   400 Units at 05/06/12 9604   Lab Results:  Results for orders placed during the hospital encounter of 05/03/12 (from the past 48 hour(s))  GLUCOSE, CAPILLARY     Status: Abnormal   Collection Time   05/04/12  4:50 PM      Component Value Range Comment   Glucose-Capillary 186 (*) 70 - 99 mg/dL    Comment 1 Notify RN     GLUCOSE, CAPILLARY     Status: Abnormal   Collection Time   05/04/12  8:29 PM      Component Value Range Comment   Glucose-Capillary 111 (*) 70 - 99 mg/dL    Comment 1 Notify RN     GLUCOSE, CAPILLARY     Status: Abnormal   Collection Time   05/05/12  6:16 AM      Component Value Range Comment   Glucose-Capillary 107 (*) 70 - 99 mg/dL   GLUCOSE, CAPILLARY     Status: Normal   Collection Time   05/05/12 11:50 AM      Component Value Range Comment   Glucose-Capillary 82  70 - 99 mg/dL    Comment 1 Notify RN     GLUCOSE, CAPILLARY     Status: Abnormal   Collection Time   05/05/12  5:03 PM      Component Value Range Comment   Glucose-Capillary 110 (*) 70 - 99 mg/dL    Comment 1 Notify RN     GLUCOSE, CAPILLARY     Status: Abnormal   Collection Time   05/05/12  9:29 PM      Component Value Range Comment   Glucose-Capillary 137 (*) 70 - 99 mg/dL    Comment 1 Notify RN     GLUCOSE, CAPILLARY     Status: Abnormal   Collection Time   05/06/12  6:06 AM      Component Value Range Comment   Glucose-Capillary 108 (*) 70 - 99 mg/dL   GLUCOSE, CAPILLARY     Status: Abnormal   Collection Time   05/06/12 11:42 AM      Component Value Range Comment   Glucose-Capillary 105 (*) 70 - 99 mg/dL    Physical Findings: AIMS: Facial and Oral Movements Muscles of Facial Expression: None, normal Lips and Perioral Area: Mild Jaw: None, normal Tongue: Mild,Extremity Movements Upper (arms, wrists, hands, fingers): None, normal Lower (legs, knees, ankles, toes): None,  normal, Trunk Movements Neck, shoulders, hips: None, normal, Overall Severity Severity of abnormal movements (highest score from questions above): Minimal Incapacitation due to abnormal movements: None, normal Patient's awareness of abnormal movements (rate only patient's report): Aware, no distress, Dental Status Current problems with teeth and/or dentures?: No Does patient usually wear dentures?: No  CIWA:  CIWA-Ar Total: 3  COWS:  COWS Total Score: 2   Review of Systems:  Neurological: The patient denies any headaches today. She denies any seizures or dizziness.  G.I.: The patient denies any constipation today or G.I. Upset.  Musculoskeletal: The patient denies any muscle or skeletal difficulties.   Time was spent today discussing with the patient her current symptoms. The patient reports to having some difficulty initiating and maintaining sleep.  She reports a good appetite and reports severe feelings of sadness, anhedonia and depressed mood. She reports constant suicidal ideations with plan and possible intent and again cannot contract for safety today. The patient reports severe anxiety symptoms as well as ongoing command auditory hallucinations instructing her to harm herself.  The patient denies any visual hallucinations or delusional thinking today.  She denies any medication related side effects today.   Treatment Plan Summary:  1. Daily contact with patient to assess and evaluate symptoms and progress in treatment.  2. Medication management  3. The patient will deny suicidal ideations or homicidal ideations for 48 hours prior to discharge and have a depression and anxiety rating of 3 or less. The patient will also deny any auditory or visual hallucinations or delusional thinking.  4. The patient will deny any symptoms of substance withdrawal at time of discharge.   Plan:  1. Will continue the medication Invega at 9 mgs po qhs to further address her psychotic symptoms.  2. Will  continue the medication Paliperidone Palmitate 115 mgs IM q 30 days with the injection given today.  3. Will continue the medication Trileptal at 300 mgs po qhs for mood stabilization.  4. Will continue the medication Cogentin at 2 mgs po q am and hs for EPS.  5. Will start the patient on the medication Trazodone at 150 mgs po qhs - prn for sleep. 6. Will continue the patient on her non-psychiatric medications as listed above.  7. Laboratory studies reviewed.  8. Will continue to monitor.   Thamar Holik 05/06/2012, 3:08 PM

## 2012-05-06 NOTE — Progress Notes (Signed)
Patient ID: [name], female   DOB: [birth year], 56 y.o.   MRN: [id number] D: Pt. continues to be preoccupied with "voices telling me to hurt myself, by pouring boiling water over my head" and other self harm. Pt. denies H/I's and V/H's.  Pt. has a 1:1 observer at all times. Pt. using her W/C for ambulation around the hall. A: Pt. continues to be a suicide risk or at least at risk for self-harm by her own admission. R:Pt. continues to be under 1:1 observations for safety.

## 2012-05-06 NOTE — Progress Notes (Signed)
[facility] Group Notes:  (Counselor/Nursing/MHT/Case Management/Adjunct)  05/06/2012 8:00PM  Type of Therapy:  Group Therapy  Participation Level:  Minimal  Participation Quality:  Resistant  Affect:  Flat  Cognitive:  Alert and Oriented  Insight:  Limited  Engagement in Group:  Limited  Engagement in Therapy:  Limited  Modes of Intervention:  Clarification, Problem-solving and Support  Summary of Progress/Problems: Pt reported that she wasn't having a good day and that she was feeling depressed. Pt verbalized that she didn't feel comfortable with divulging her personal information. Pt did verbalize that she would attempt attending her groups.  Thane Age, Randal Buba 05/06/2012, 8:49 PM

## 2012-05-06 NOTE — Progress Notes (Signed)
Psychoeducational Group Note  Date:  05/06/2012 Time:  2000  Group Topic/Focus:  Wrap-Up Group:   The focus of this group is to help patients review their daily goal of treatment and discuss progress on daily workbooks.  Participation Level:  Active  Participation Quality:  Appropriate  Affect:  Appropriate  Cognitive:  Appropriate  Insight:  Good  Engagement in Group:  Good  Additional Comments:  Patient attended and participated in group tonight. She reports that the day went well for her. She attended group, and eat well. Pt advised that she takes care of her wellness by exercising and praying.  Lita Mains [facility] 05/06/2012, 12:13 AM

## 2012-05-07 LAB — GLUCOSE, CAPILLARY
Glucose-Capillary: 128 mg/dL — ABNORMAL HIGH (ref 70–99)
Glucose-Capillary: 118 mg/dL — ABNORMAL HIGH (ref 70–99)
Glucose-Capillary: 174 mg/dL — ABNORMAL HIGH (ref 70–99)
Glucose-Capillary: 146 mg/dL — ABNORMAL HIGH (ref 70–99)

## 2012-05-07 MED ORDER — SERTRALINE HCL 100 MG PO TABS
100.0000 mg | ORAL_TABLET | Freq: Every day | ORAL | Status: DC
  Administered 2012-05-08 – 2012-05-12 (×5): 100 mg via ORAL
  Filled 2012-05-07 (×7): qty 1

## 2012-05-07 MED ORDER — MELOXICAM 7.5 MG PO TABS
7.5000 mg | ORAL_TABLET | Freq: Two times a day (BID) | ORAL | Status: DC
  Administered 2012-05-07 – 2012-05-15 (×16): 7.5 mg via ORAL
  Filled 2012-05-07 (×19): qty 1

## 2012-05-07 NOTE — Progress Notes (Signed)
Patient ID: [name], female   DOB: 07-03-1956, 56 y.o.   MRN: [id number]  See DAR notes on chart

## 2012-05-07 NOTE — Progress Notes (Signed)
D: Patient in day room in wheelchair on approach.  Patient is on a 1:1 observation because she is unable to contract for safety.  Patient states she had been depressed all day until she went to rec therapy and she was able to play volleyball.  Patient states that this activity brightened her day.  Patient rates depression 5/10 tonight.  Patient endorses SI and auditory hallucinations.  Patient states the voices are telling her to cut or burn herself she also states they tell her to hurt or kill herself as well.  Patient cannot contract for safety.    A: Patient offered encouragement and support and to speak to staff for questions and/or concerns. Scheduled medications administered. Staff to monitor Q 15 mins for safety. On 1:1 for safety.  Trazodone administered administered prn patient states she needs help to fall asleep. R: Patient calm and cooperative and remains safe on the unit. Patient taking administered medications. Patient remains on 1:1 for safety and patient does not contract for safety.  As of 2359 Patient resting in bed with eyes open after trazodone.   **Q 4 hour nurses note written on bedside progress note.

## 2012-05-07 NOTE — Progress Notes (Signed)
[facility] MD Progress Note  05/07/2012 12:48 PM  Diagnosis:  Axis I: Schizophrenia - Paranoid Type.   The patient was seen today and reports the following:   ADL's: Intact.  Sleep: The patient reports to sleeping well last night.  Appetite: The patient reports that her appetite is good.   Mild>(1-10) >Severe  Hopelessness (1-10): 7  Depression (1-10): 7  Anxiety (1-10): 7   Suicidal Ideation: The patient reports ongoing suicidal ideations with both plan and possible intent.  Plan: Possible  Intent: Possible  Means: No   Homicidal Ideation: The patient again denies any homicidal ideations today.  Plan: No  Intent: No.  Means: No   General Appearance/Behavior: The patient remained cooperative today with the treatment team.  Eye Contact: Good.  Speech: Appropriate in rate and volume with no pressuring noted today.  Motor Behavior: wnl.  Level of Consciousness: Alert and Oriented x 3.  Mental Status: Alert and Oriented x 3.  Mood: Appears moderately depressed.  Affect: Moderately constricted.  Anxiety Level: Moderate anxiety reported today.  Thought Process: Psychotic today.  Thought Content: The patient reports ongoing auditory hallucinations instructing her to kill herself. She denies any visual hallucinations or delusional thinking today.  Perception: Psychotic today.  Judgment: Poor.  Insight: Poor.  Cognition: Oriented to person, place and time.  Sleep:  Number of Hours: 6.75    Vital Signs:Blood pressure 120/82, pulse 101, temperature 97.5 F (36.4 C), temperature source Oral, resp. rate 18, height 5\' 5"  (1.651 m), weight 112.492 kg (248 lb).  Current Medications: Current Facility-Administered Medications  Medication Dose Route Frequency Provider Last Rate Last Dose  . acetaminophen (TYLENOL) tablet 650 mg  650 mg Oral Q6H PRN Cleotis Nipper, MD      . alum & mag hydroxide-simeth (MAALOX/MYLANTA) 200-200-20 MG/5ML suspension 30 mL  30 mL Oral Q4H PRN Cleotis Nipper, MD    30 mL at 05/06/12 2156  . amLODipine (NORVASC) tablet 10 mg  10 mg Oral Daily Curlene Labrum Chattie Greeson, MD   10 mg at 05/07/12 0807  . aspirin EC tablet 81 mg  81 mg Oral Daily Curlene Labrum Jerick Khachatryan, MD   81 mg at 05/07/12 0807  . benztropine (COGENTIN) tablet 2 mg  2 mg Oral BH-qamhs Curlene Labrum Rees Santistevan, MD   2 mg at 05/07/12 0807  . docusate sodium (COLACE) capsule 100 mg  100 mg Oral BH-qamhs Curlene Labrum Meggin Ola, MD   100 mg at 05/07/12 0807  . glipiZIDE (GLUCOTROL) tablet 10 mg  10 mg Oral BID AC Curlene Labrum Idalys Konecny, MD   10 mg at 05/07/12 0654  . insulin aspart (novoLOG) injection 0-20 Units  0-20 Units Subcutaneous TID WC Ronny Bacon, MD   3 Units at 05/07/12 0654  . levothyroxine (SYNTHROID, LEVOTHROID) tablet 88 mcg  88 mcg Oral QAC breakfast Curlene Labrum Vanilla Heatherington, MD   88 mcg at 05/07/12 0654  . linagliptin (TRADJENTA) tablet 5 mg  5 mg Oral Daily Curlene Labrum Brookelynne Dimperio, MD   5 mg at 05/07/12 0807  . LORazepam (ATIVAN) tablet 0.5 mg  0.5 mg Oral BID PRN Verne Spurr, PA-C   0.5 mg at 05/06/12 0121  . magnesium hydroxide (MILK OF MAGNESIA) suspension 30 mL  30 mL Oral Daily PRN Cleotis Nipper, MD      . meloxicam (MOBIC) tablet 15 mg  15 mg Oral BID Curlene Labrum Bernadine Melecio, MD   15 mg at 05/07/12 0807  . metFORMIN (GLUCOPHAGE) tablet 500 mg  500 mg  Oral BID WC Curlene Labrum Meleah Demeyer, MD   500 mg at 05/07/12 4098  . Oxcarbazepine (TRILEPTAL) tablet 300 mg  300 mg Oral QHS Curlene Labrum Eviana Sibilia, MD   300 mg at 05/06/12 2153  . paliperidone (INVEGA) 24 hr tablet 9 mg  9 mg Oral QHS Curlene Labrum Jonisha Kindig, MD   9 mg at 05/06/12 2153  . Paliperidone Palmitate SUSP 117 mg  117 mg Intramuscular Q30 days Ronny Bacon, MD   117 mg at 05/06/12 1153  . pantoprazole (PROTONIX) EC tablet 40 mg  40 mg Oral QHS Curlene Labrum Kennisha Qin, MD   40 mg at 05/06/12 2154  . polyethylene glycol (MIRALAX / GLYCOLAX) packet 17 g  17 g Oral Daily Curlene Labrum Rashidi Loh, MD      . sertraline (ZOLOFT) tablet 100 mg  100 mg Oral Daily Curlene Labrum Hussein Macdougal, MD      . simvastatin  (ZOCOR) tablet 40 mg  40 mg Oral QHS Curlene Labrum Demetrice Amstutz, MD   40 mg at 05/06/12 2154  . traZODone (DESYREL) tablet 150 mg  150 mg Oral QHS PRN Ronny Bacon, MD      . vitamin E capsule 400 Units  400 Units Oral Daily Ronny Bacon, MD   400 Units at 05/07/12 0808  . DISCONTD: sertraline (ZOLOFT) tablet 50 mg  50 mg Oral Daily Curlene Labrum Thaddaeus Granja, MD   50 mg at 05/07/12 0807  . DISCONTD: traZODone (DESYREL) tablet 150 mg  150 mg Oral QHS Ronny Bacon, MD       Lab Results:  Results for orders placed during the hospital encounter of 05/03/12 (from the past 48 hour(s))  GLUCOSE, CAPILLARY     Status: Abnormal   Collection Time   05/05/12  5:03 PM      Component Value Range Comment   Glucose-Capillary 110 (*) 70 - 99 mg/dL    Comment 1 Notify RN     GLUCOSE, CAPILLARY     Status: Abnormal   Collection Time   05/05/12  9:29 PM      Component Value Range Comment   Glucose-Capillary 137 (*) 70 - 99 mg/dL    Comment 1 Notify RN     GLUCOSE, CAPILLARY     Status: Abnormal   Collection Time   05/06/12  6:06 AM      Component Value Range Comment   Glucose-Capillary 108 (*) 70 - 99 mg/dL   GLUCOSE, CAPILLARY     Status: Abnormal   Collection Time   05/06/12 11:42 AM      Component Value Range Comment   Glucose-Capillary 105 (*) 70 - 99 mg/dL   GLUCOSE, CAPILLARY     Status: Abnormal   Collection Time   05/06/12  5:12 PM      Component Value Range Comment   Glucose-Capillary 139 (*) 70 - 99 mg/dL    Comment 1 Notify RN     GLUCOSE, CAPILLARY     Status: Normal   Collection Time   05/06/12  8:46 PM      Component Value Range Comment   Glucose-Capillary 90  70 - 99 mg/dL    Comment 1 Notify RN     GLUCOSE, CAPILLARY     Status: Abnormal   Collection Time   05/07/12  6:01 AM      Component Value Range Comment   Glucose-Capillary 128 (*) 70 - 99 mg/dL   GLUCOSE, CAPILLARY     Status: Abnormal   Collection Time  05/07/12 12:01 PM      Component Value Range Comment   Glucose-Capillary  118 (*) 70 - 99 mg/dL    Comment 1 Notify RN      Physical Findings: AIMS: Facial and Oral Movements Muscles of Facial Expression: None, normal Lips and Perioral Area: None, normal Jaw: None, normal Tongue: None, normal,Extremity Movements Upper (arms, wrists, hands, fingers): None, normal Lower (legs, knees, ankles, toes): None, normal, Trunk Movements Neck, shoulders, hips: None, normal, Overall Severity Severity of abnormal movements (highest score from questions above): None, normal Incapacitation due to abnormal movements: None, normal Patient's awareness of abnormal movements (rate only patient's report): No Awareness, Dental Status Current problems with teeth and/or dentures?: No Does patient usually wear dentures?: No  CIWA:  CIWA-Ar Total: 3  COWS:  COWS Total Score: 2   Review of Systems:  Neurological: The patient denies any headaches today. She denies any seizures or dizziness.  G.I.: The patient denies any constipation today or G.I. Upset.  Musculoskeletal: The patient denies any muscle or skeletal difficulties.   Time was spent today discussing with the patient her current symptoms. The patient reports to sleeping well at night with the recent addition of Trazodone.  She reports a good appetite and reports moderate feelings of sadness, anhedonia and depressed mood today. She reports constant suicidal ideations with plan and possible intent and again cannot contract for safety today. The patient reports moderate anxiety symptoms as well as ongoing command auditory hallucinations instructing her to harm herself. The patient denies any visual hallucinations or delusional thinking today. She denies any medication related side effects today.   The patient was given her injection of Invega yesterday and it was explained to her that it may take some time for her psychotic symptoms to improve.  The patient acknowledged this and was grateful for the care she is receiving.  Treatment  Plan Summary:  1. Daily contact with patient to assess and evaluate symptoms and progress in treatment.  2. Medication management  3. The patient will deny suicidal ideations or homicidal ideations for 48 hours prior to discharge and have a depression and anxiety rating of 3 or less. The patient will also deny any auditory or visual hallucinations or delusional thinking.  4. The patient will deny any symptoms of substance withdrawal at time of discharge.   Plan:  1. Will continue the medication Invega at 9 mgs po qhs to further address her psychotic symptoms.  2. Will continue the medication Paliperidone Palmitate 115 mgs IM q 30 days with the injection given today.  3. Will continue the medication Trileptal at 300 mgs po qhs for mood stabilization.  4. Will continue the medication Cogentin at 2 mgs po q am and hs for EPS.  5. Will increase the medication Zoloft 100 mgs po q am for depression and anxiety. 6. Will continue the patient on the medication Trazodone at 150 mgs po qhs - prn for sleep.  7. Will continue the patient on her non-psychiatric medications as listed above.  8. Will continue the patient on her 1 to 1 until further stabilized. 9. Laboratory studies reviewed.  10. Will continue to monitor.   Josealberto Montalto 05/07/2012, 12:48 PM

## 2012-05-07 NOTE — Progress Notes (Signed)
Pt attended discharge planning group and actively participated in group.  SW provided pt with today's workbook.  Pt presents with flat affect and depressed mood.  Pt rates depression at a 9 and anxiety at a 10 today.  Pt continues to endorse SI, stating that she hears voices telling her to kill herself.  Pt will return to [facility] for ACTT services and is able to return home to [facility]  SW will continues to monitor pt's progress.  No further needs voiced by pt at this time.    [name], LCSWA 05/07/2012  11:45 AM    Per State Regulation 482.30 This Chart was reviewed for medical necessity with respect to the patient's Admission/Duration of stay.   [name]  05/07/2012  Next Review Date: 05/09/12

## 2012-05-07 NOTE — Progress Notes (Signed)
05/07/2012         Time: 0930      Group Topic/Focus: The focus of this group is on discussing various aspects of wellness, balancing those aspects and exploring ways to increase the ability to experience wellness.  Participation Level: Active  Participation Quality: Appropriate and Attentive  Affect: Appropriate  Cognitive: Oriented   Additional Comments: Patient reported she was in a good mood today because she found out she would be getting her own room. Patient flat at start of group, but brightened during the activity   Kree Armato 05/07/2012 10:16 AM

## 2012-05-07 NOTE — Progress Notes (Addendum)
Nursing 1:1 Note  D: Pt resting. Eyes closed. Resp even and unlabored. No distress noted. A: Continue 1:1 observation for pt safety. R: Pt remains safe on the unit.

## 2012-05-07 NOTE — Progress Notes (Signed)
Psychoeducational Group Note  Date:  05/07/2012 Time: 2000  Group Topic/Focus:  Goals Group:   The focus of this group is to help patients establish daily goals to achieve during treatment and discuss how the patient can incorporate goal setting into their daily lives to aide in recovery.  Participation Level:  Active  Participation Quality:  Appropriate  Affect:  Flat  Cognitive:  Appropriate  Insight:  Good  Engagement in Group:  Good  Additional Comments:  Patient was appropriate during group. Patient shared with her peers that she enjoyed the recreation therapy group that was held today. Patient explained that she looks forward to doing water aerobics at the [facility] upon discharge. Patient voiced no complaints.  Lyndee Hensen 05/07/2012, 10:48 PM

## 2012-05-07 NOTE — Progress Notes (Signed)
[facility] Group Notes:  (Counselor/Nursing/MHT/Case Management/Adjunct)  05/07/2012 2:34 PM  Type of Therapy:  Psychoeducation   Participation Level:  Active  Participation Quality:  Attentive and Sharing  Affect:  Blunted  Cognitive:  Oriented  Insight:  Limited  Engagement in Group:  Good  Engagement in Therapy:  Good  Modes of Intervention:  Education and Support  Summary of Progress/Problems:Patient was attentive and engaged during the mental health association presentation. Told the speaker after group that she really could relate to some of his symptoms. She also was engaged nonverbally by nodding her head several times during the session.   [name] 05/07/2012, 2:34 PM

## 2012-05-07 NOTE — Progress Notes (Signed)
D: Pt resting. Eyes closed. Resp even and unlabored. No distress noted. A: Continue 1:1 observation for pt safety. R: Pt remains safe on the unit.

## 2012-05-08 LAB — GLUCOSE, CAPILLARY
Glucose-Capillary: 115 mg/dL — ABNORMAL HIGH (ref 70–99)
Glucose-Capillary: 120 mg/dL — ABNORMAL HIGH (ref 70–99)
Glucose-Capillary: 108 mg/dL — ABNORMAL HIGH (ref 70–99)
Glucose-Capillary: 199 mg/dL — ABNORMAL HIGH (ref 70–99)

## 2012-05-08 MED ORDER — TRAZODONE HCL 150 MG PO TABS
150.0000 mg | ORAL_TABLET | Freq: Every day | ORAL | Status: DC
  Administered 2012-05-08: 150 mg via ORAL
  Filled 2012-05-08 (×3): qty 1

## 2012-05-08 NOTE — Progress Notes (Signed)
05/08/12 1930:D:  Patient in the day room in wheelchair. Patient on 1:1 for safety.   Patient states she is feeling a little better.  Patient rates depression and hopelessness 7/10 at this time.  Patient states her depression was better today after her sisters came to see her at dinner time.  Patient states she is currently having suicidal thoughts but they are decreasing. Patient states she is having homicidal thoughts towards the doctor because he is trying to hypnotize her, making her think and say things.  Patient them states she would not try to hurt him be she would yell at him really loud.  Patient states she hears voices that tell her to cut and burn herself.  Patient cannot contract for safety at this time. A: 1:1 will continue for patient safety.  Support and encouragement offered. R: Patient remains safe on the unit. 2130:  Patient at the med window for medications. Patient cbg was taken and cbg=199 no nighttime coverage.  Scheduled medications administered per orders.  Patient remains on 1:1 for safety at this time.  Patient taking all administered medications.  Patient remains safe on the unit at this time. 2330: Patient laying in bed with eyes open patient states she is ok. Patient states she is trying to fall asleep. Patient remains on 1:1 for safety.

## 2012-05-08 NOTE — Progress Notes (Signed)
[facility] Group Notes:  (Counselor/Nursing/MHT/Case Management/Adjunct)  05/08/2012 3:38 PM  Type of Therapy:  Music Therapy  Participation Level:  Did Not Attend     [name] 05/08/2012, 3:38 PM

## 2012-05-08 NOTE — Tx Team (Signed)
Interdisciplinary Treatment Plan Update (Adult)  Date: 05/08/2012  Time Reviewed: 11:23 AM  Progress in Treatment:  Attending groups: Yes  Participating in groups: Yes  Taking medication as prescribed: Yes  Tolerating medication: Yes  Family/Significant othe contact made: Counselor assessing for appropriate contact  Patient understands diagnosis: Yes  Discussing patient identified problems/goals with staff: Yes  Medical problems stabilized or resolved: Yes  Denies suicidal/homicidal ideation: Yes  Issues/concerns per patient self-inventory: None identified  Other: N/A  New problem(s) identified: None Identified  Reason for Continuation of Hospitalization:  Anxiety  Depression  Hallucinations  Homicidal ideation  Medication stabilization  Suicidal ideation   Interventions implemented related to continuation of hospitalization: mood stabilization, medication monitoring and adjustment, group therapy and psycho education, safety checks q 15 mins   Additional comments: N/A   Estimated length of stay: 3-5 days   Discharge Plan: Pt will return to [facility] and [facility] for follow up and placement.   New goal(s): N/A   Review of initial/current patient goals per problem list:  1. Goal(s): Reduce depressive and anxiety symptoms  Met: No  Target date: by discharge  As evidenced by: Reducing depression from a 10 to a 3 as reported by pt. Pt rates depression at a 6 and anxiety at a 9 today.    2. Goal (s): Eliminate Suicidal Ideation/Homicidal Ideation  Met: No  Target date: by discharge  As evidenced by: Eliminate suicidal ideation/homicidal ideation.  Pt continues to endorse SI/HI.  3. Goal(s): Reduce auditory hallucinations  Met: No  Target date: by discharge  As evidenced by: Reduce voices to baseline, as reported by pt.  Attendees:  Patient:   Family:    Physician: Franchot Gallo, MD  05/08/12 11:20 am  Nursing: Joslyn Devon, RN  05/08/12 11:20 am  Case  Manager: Reyes Ivan, LCSWA  05/08/12 11:20 am  Counselor: Veto Kemps, MT-BC  05/08/12 11:20 am  Other: Clarice Pole, LCASA  05/08/12 11:20 am  Other: Quintella Reichert, RN  05/08/12 11:20 am  Other: Trula Slade, MSW intern 05/08/2012 11:20 am  Other:    Scribe for Treatment Team:  Carmina Miller, 05/08/2012, 11:23 AM

## 2012-05-08 NOTE — Progress Notes (Signed)
[facility] MD Progress Note  05/08/2012 1:59 PM  S: "I did not sleep well last night. As a result, my mood is not doing the best today. I have been having both auditory/visual hallucinations for 3 weeks now. The voices are not as aggressive as they were, but they are still there. I am using this wheel chair to get around because my legs are weak. I use the walker at home. I have had several falls at home because of my leg weakness. I like the Ativan, it working well on my anxiety".  Diagnosis:   Axis I: Schizophrenia, paranoid type Axis II: Deferred Axis III:  Past Medical History  Diagnosis Date  . Diabetes mellitus   . GERD (gastroesophageal reflux disease)   . Borderline personality disorder   . Schizoaffective disorder   . Depression   . Hyperlipidemia   . Chronic back pain   . Hypothyroidism   . Schizophrenia   . Schizoaffective disorder 04/29/2012  . HTN (hypertension) 04/29/2012  . Bipolar affective disorder 04/29/2012  . Diabetes mellitus 04/29/2012   Axis IV: other psychosocial or environmental problems Axis V: 21-30 behavior considerably influenced by delusions or hallucinations OR serious impairment in judgment, communication OR inability to function in almost all areas  ADL's:  Intact  Sleep: Poor  Appetite:  Good  Suicidal Ideation: "No" Plan:  No Intent:  No Means:  No  Homicidal Ideation:  Plan:  No Intent:  No Means:  No  AEB (as evidenced by): Per patient's reports  Mental Status Examination/Evaluation: Objective:  Appearance: Casual, sitting up in a wheel chair  Eye Contact::  Good  Speech:  Clear and Coherent  Volume:  Normal  Mood:  "My mood is not the best today"  Affect:  Flat  Thought Process:  Coherent  Orientation:  Full  Thought Content:  Rumination  Suicidal Thoughts:  No  Homicidal Thoughts:  No  Memory:  Immediate;   Good Recent;   Good Remote;   Fair  Judgement:  Fair  Insight:  Fair  Psychomotor Activity:  Normal  Concentration:   Fair  Recall:  Good  Akathisia:  No  Handed:  Right  AIMS (if indicated):     Assets:  Desire for Improvement  Sleep:  Number of Hours: 4    Vital Signs:Blood pressure 117/81, pulse 97, temperature 97.9 F (36.6 C), temperature source Oral, resp. rate 16, height 5\' 5"  (1.651 m), weight 112.492 kg (248 lb). Current Medications: Current Facility-Administered Medications  Medication Dose Route Frequency Provider Last Rate Last Dose  . acetaminophen (TYLENOL) tablet 650 mg  650 mg Oral Q6H PRN Cleotis Nipper, MD      . alum & mag hydroxide-simeth (MAALOX/MYLANTA) 200-200-20 MG/5ML suspension 30 mL  30 mL Oral Q4H PRN Cleotis Nipper, MD   30 mL at 05/06/12 2156  . amLODipine (NORVASC) tablet 10 mg  10 mg Oral Daily Curlene Labrum Readling, MD   10 mg at 05/08/12 0825  . aspirin EC tablet 81 mg  81 mg Oral Daily Curlene Labrum Readling, MD   81 mg at 05/08/12 0827  . benztropine (COGENTIN) tablet 2 mg  2 mg Oral BH-qamhs Curlene Labrum Readling, MD   2 mg at 05/08/12 9604  . docusate sodium (COLACE) capsule 100 mg  100 mg Oral BH-qamhs Curlene Labrum Readling, MD   100 mg at 05/08/12 0823  . glipiZIDE (GLUCOTROL) tablet 10 mg  10 mg Oral BID AC Ronny Bacon, MD   10  mg at 05/08/12 0644  . insulin aspart (novoLOG) injection 0-20 Units  0-20 Units Subcutaneous TID WC Ronny Bacon, MD   4 Units at 05/07/12 1704  . levothyroxine (SYNTHROID, LEVOTHROID) tablet 88 mcg  88 mcg Oral QAC breakfast Curlene Labrum Readling, MD   88 mcg at 05/08/12 0644  . linagliptin (TRADJENTA) tablet 5 mg  5 mg Oral Daily Curlene Labrum Readling, MD   5 mg at 05/08/12 0824  . LORazepam (ATIVAN) tablet 0.5 mg  0.5 mg Oral BID PRN Verne Spurr, PA-C   0.5 mg at 05/06/12 0121  . magnesium hydroxide (MILK OF MAGNESIA) suspension 30 mL  30 mL Oral Daily PRN Cleotis Nipper, MD      . meloxicam (MOBIC) tablet 7.5 mg  7.5 mg Oral BID Curlene Labrum Readling, MD   7.5 mg at 05/08/12 0824  . metFORMIN (GLUCOPHAGE) tablet 500 mg  500 mg Oral BID WC Curlene Labrum Readling, MD   500  mg at 05/08/12 0824  . Oxcarbazepine (TRILEPTAL) tablet 300 mg  300 mg Oral QHS Curlene Labrum Readling, MD   300 mg at 05/07/12 2134  . paliperidone (INVEGA) 24 hr tablet 9 mg  9 mg Oral QHS Curlene Labrum Readling, MD   9 mg at 05/07/12 2134  . Paliperidone Palmitate SUSP 117 mg  117 mg Intramuscular Q30 days Ronny Bacon, MD   117 mg at 05/06/12 1153  . pantoprazole (PROTONIX) EC tablet 40 mg  40 mg Oral QHS Curlene Labrum Readling, MD   40 mg at 05/07/12 2134  . polyethylene glycol (MIRALAX / GLYCOLAX) packet 17 g  17 g Oral Daily Curlene Labrum Readling, MD      . sertraline (ZOLOFT) tablet 100 mg  100 mg Oral Daily Curlene Labrum Readling, MD   100 mg at 05/08/12 0826  . simvastatin (ZOCOR) tablet 40 mg  40 mg Oral QHS Curlene Labrum Readling, MD   40 mg at 05/07/12 2134  . traZODone (DESYREL) tablet 150 mg  150 mg Oral QHS PRN Ronny Bacon, MD   150 mg at 05/07/12 2326  . vitamin E capsule 400 Units  400 Units Oral Daily Ronny Bacon, MD   400 Units at 05/08/12 0826  . DISCONTD: meloxicam (MOBIC) tablet 15 mg  15 mg Oral BID Ronny Bacon, MD   15 mg at 05/07/12 7846    Lab Results:  Results for orders placed during the hospital encounter of 05/03/12 (from the past 48 hour(s))  GLUCOSE, CAPILLARY     Status: Abnormal   Collection Time   05/06/12  5:12 PM      Component Value Range Comment   Glucose-Capillary 139 (*) 70 - 99 mg/dL    Comment 1 Notify RN     GLUCOSE, CAPILLARY     Status: Normal   Collection Time   05/06/12  8:46 PM      Component Value Range Comment   Glucose-Capillary 90  70 - 99 mg/dL    Comment 1 Notify RN     GLUCOSE, CAPILLARY     Status: Abnormal   Collection Time   05/07/12  6:01 AM      Component Value Range Comment   Glucose-Capillary 128 (*) 70 - 99 mg/dL   GLUCOSE, CAPILLARY     Status: Abnormal   Collection Time   05/07/12 12:01 PM      Component Value Range Comment   Glucose-Capillary 118 (*) 70 - 99 mg/dL  Comment 1 Notify RN     GLUCOSE, CAPILLARY     Status: Abnormal     Collection Time   05/07/12  4:47 PM      Component Value Range Comment   Glucose-Capillary 174 (*) 70 - 99 mg/dL    Comment 1 Notify RN     GLUCOSE, CAPILLARY     Status: Abnormal   Collection Time   05/07/12  8:28 PM      Component Value Range Comment   Glucose-Capillary 146 (*) 70 - 99 mg/dL    Comment 1 Notify RN     GLUCOSE, CAPILLARY     Status: Abnormal   Collection Time   05/08/12  6:03 AM      Component Value Range Comment   Glucose-Capillary 115 (*) 70 - 99 mg/dL   GLUCOSE, CAPILLARY     Status: Abnormal   Collection Time   05/08/12 12:00 PM      Component Value Range Comment   Glucose-Capillary 120 (*) 70 - 99 mg/dL    Comment 1 Notify RN       Physical Findings: AIMS: Facial and Oral Movements Muscles of Facial Expression: None, normal Lips and Perioral Area: None, normal Jaw: None, normal Tongue: None, normal,Extremity Movements Upper (arms, wrists, hands, fingers): None, normal Lower (legs, knees, ankles, toes): None, normal, Trunk Movements Neck, shoulders, hips: None, normal, Overall Severity Severity of abnormal movements (highest score from questions above): None, normal Incapacitation due to abnormal movements: None, normal Patient's awareness of abnormal movements (rate only patient's report): No Awareness, Dental Status Current problems with teeth and/or dentures?: No Does patient usually wear dentures?: No  CIWA:  CIWA-Ar Total: 5  COWS:  COWS Total Score: 1   Treatment Plan Summary: Daily contact with patient to assess and evaluate symptoms and progress in treatment Medication management  Plan: Changed Trazodone from 150 mg PRN to Q bedtime routinely for sleep.  Continue 1:1 supervision as recommended. Continue current treatment plan.  Armandina Stammer I 05/08/2012, 1:59 PM

## 2012-05-08 NOTE — Progress Notes (Addendum)
0730   Patient has 1:1 present, sitting in wheelchair in her room, eating breakfast 100%.   Patient stated she does have thoughts of hurting herself by cutting or burning herself.  Clinical research associate  for safety.  Patient stated she is mad at the doctor because he is trying to hypnotize her and she wants to hurt him.  Stated she is seeing her deceased parents who want to hurt her.  While nurse and 1:1 are in her room, patient stated she can see her deceased dad in the corner of the room who wants to hurt her.   Stated [facility] called and she is coming to see patient soon.  1:1 continues for safety.   Safety maintained.  No signs of pain/distress noted on patient's face or body movements. 0930  Patient went to discharge planning group.  Stated she is feeling "so so today".  Rated depression #7-8.  Anxiety 8-9.  Still has SI thoughts today.  Has HI thoughts which she will discuss later.  1:1 present for safety. On patient's self inventory sheet, patient has poor sleep, good appetite, low energy level, improving attention span.  Rated depression #8, hopelessness #6.  Denied withdrawals.  SI thoughts, will not contract for safety.  Has experienced right knee pain.   Patient has also attended morning group with 1:1 present.   No signs of pain/distress noted on patient's face or body movements. 1030   Patient sitting in dayroom in her wheelchair with 1:1 present.   Patient attended group this morning and ate pudding during break.   Patient stated she still has thoughts of hurting her MD by stabbing him.   Still sees her dad standing in the corner of dayroom but dad is not talking to her.   Patient still hearings voices telling her to hurt herself.   Patient did contract for safety at this time.   1:1 continues per MD order.  Safety maintained. 1430   Patient presently sleeping in her bed  with 1:1 present.   Patient ate approximately 50% of her lunch.   Patient has talked about her hallucinations today, seeing  her deceased dad and voices telling her to hurt her MD.  1:1 will continue per MD order.   Patient has been using her wheelchair when out of bed today.  Will continue to monitor patient for safety.   Safety maintained. 1730   Patient in wheelchair in hallway for her medications.   Patient has 1:1 with her entire day for safety.  Patient stated she is still hearing voices telling her to hurt herself by cutting or burning herself.  Continues to have thoughts of hurting MD by stabbing him.  Continues to see her deceased dad today who is watching her.  Denied pain at this time.  Patient took nap this afternoon.  CBG at 1700 was 108.  No insulin given per MD order.  Patient shows no signs of pain or discomfort on face or body movements.   1:1 will continue per MD order for safety.  Safety maintained.

## 2012-05-08 NOTE — Progress Notes (Signed)
Pt attended discharge planning group and actively participated in group.  SW provided pt with today's workbook.  Pt presents with flat affect and depressed mood.  Pt rates depression at a 6 and anxiety at a 9 today.  Pt continues to endorse SI, and today HI.  Pt states that she is still hearing voices that are telling her to kill herself.  Pt states that she feels Dr. Allena Katz is hypnotizing and doesn't feel she will get better until she kills him.  Pt states she also started seeing her dad in the room standing over her with a gun, trying to kill her. Pt states that her dad is in a nursing home with Alzheimer's. SW discussed what pt looks like when she is not hearing voices or seeing things.  Pt states that she likes to watch sports on TV or listen to the radio to distract herself at home.  Pt also discussed her 2 sisters coming to visit her this evening and plans to talk about them being her guardian.  Pt doesn't appear to be getting any better.  Discussed in treatment team making a referral to [facility].  SW will begin this process.  No further needs voiced by pt at this time.    [name], [place] 05/08/2012  12:49 PM

## 2012-05-09 LAB — GLUCOSE, CAPILLARY
Glucose-Capillary: 116 mg/dL — ABNORMAL HIGH (ref 70–99)
Glucose-Capillary: 120 mg/dL — ABNORMAL HIGH (ref 70–99)
Glucose-Capillary: 165 mg/dL — ABNORMAL HIGH (ref 70–99)
Glucose-Capillary: 127 mg/dL — ABNORMAL HIGH (ref 70–99)

## 2012-05-09 MED ORDER — TRAZODONE HCL 100 MG PO TABS
200.0000 mg | ORAL_TABLET | Freq: Every day | ORAL | Status: DC
  Administered 2012-05-09 – 2012-05-14 (×6): 200 mg via ORAL
  Filled 2012-05-09 (×8): qty 2

## 2012-05-09 NOTE — Progress Notes (Signed)
[facility] MD Progress Note  05/09/2012 5:34 PM  Diagnosis:  Axis I: Schizophrenia - Paranoid Type.   The patient was seen today and reports the following:   ADL's: Intact.  Sleep: The patient reports to having significant difficulty sleeping for the last two night.  Appetite: The patient reports that her appetite is good.   Mild>(1-10) >Severe  Hopelessness (1-10): 3  Depression (1-10): 7  Anxiety (1-10): 2-3   Suicidal Ideation:  The patient reports ongoing suicidal ideations with both plan and possible intent.  Plan: Possible  Intent: Possible  Means: No   Homicidal Ideation: The patient reports homicidal ideations directed toward this Physician.  She calmly states to his provider that she feels the need to "beat you to death" in order to feel better. Plan: No  Intent: No.  Means: No   General Appearance/Behavior: The patient remained cooperative today with this provider despite wishing to kill me.  Eye Contact: Good.  Speech: Appropriate in rate and volume with no pressuring noted today.  Motor Behavior: wnl.  Level of Consciousness: Alert and Oriented x 3.  Mental Status: Alert and Oriented x 3.  Mood: Appears moderately depressed.  Affect: Moderately constricted.  Anxiety Level: Mild anxiety reported today.  Thought Process: Psychotic today.  Thought Content: The patient reports ongoing auditory hallucinations instructing her to kill herself.  She however states these are improving.  She denies any visual hallucinations or delusional thinking today.  Perception: Psychotic today.  Judgment: Poor.  Insight: Poor.  Cognition: Oriented to person, place and time.  Sleep:  Number of Hours: 2.5    Vital Signs:Blood pressure 100/88, pulse 92, temperature 97 F (36.1 C), temperature source Oral, resp. rate 16, height 5\' 5"  (1.651 m), weight 112.492 kg (248 lb).  Current Medications: Current Facility-Administered Medications  Medication Dose Route Frequency Provider Last Rate  Last Dose  . acetaminophen (TYLENOL) tablet 650 mg  650 mg Oral Q6H PRN Cleotis Nipper, MD      . alum & mag hydroxide-simeth (MAALOX/MYLANTA) 200-200-20 MG/5ML suspension 30 mL  30 mL Oral Q4H PRN Cleotis Nipper, MD   30 mL at 05/06/12 2156  . amLODipine (NORVASC) tablet 10 mg  10 mg Oral Daily Curlene Labrum Ednamae Schiano, MD   10 mg at 05/09/12 0805  . aspirin EC tablet 81 mg  81 mg Oral Daily Curlene Labrum Cecilio Ohlrich, MD   81 mg at 05/09/12 0804  . benztropine (COGENTIN) tablet 2 mg  2 mg Oral BH-qamhs Curlene Labrum Symphony Demuro, MD   2 mg at 05/09/12 0805  . docusate sodium (COLACE) capsule 100 mg  100 mg Oral BH-qamhs Curlene Labrum Jakara Blatter, MD   100 mg at 05/09/12 0805  . glipiZIDE (GLUCOTROL) tablet 10 mg  10 mg Oral BID AC Curlene Labrum Ka Flammer, MD   10 mg at 05/09/12 1712  . insulin aspart (novoLOG) injection 0-20 Units  0-20 Units Subcutaneous TID WC Ronny Bacon, MD   4 Units at 05/09/12 1713  . levothyroxine (SYNTHROID, LEVOTHROID) tablet 88 mcg  88 mcg Oral QAC breakfast Ronny Bacon, MD   88 mcg at 05/09/12 [id number]  . linagliptin (TRADJENTA) tablet 5 mg  5 mg Oral Daily Curlene Labrum Astaria Nanez, MD   5 mg at 05/09/12 0804  . LORazepam (ATIVAN) tablet 0.5 mg  0.5 mg Oral BID PRN Verne Spurr, PA-C   0.5 mg at 05/06/12 0121  . magnesium hydroxide (MILK OF MAGNESIA) suspension 30 mL  30 mL Oral Daily PRN Kathi Ludwig  Konrad Saha, MD      . meloxicam (MOBIC) tablet 7.5 mg  7.5 mg Oral BID Curlene Labrum Michaele Amundson, MD   7.5 mg at 05/09/12 1712  . metFORMIN (GLUCOPHAGE) tablet 500 mg  500 mg Oral BID WC Curlene Labrum Cheron Coryell, MD   500 mg at 05/09/12 1712  . Oxcarbazepine (TRILEPTAL) tablet 300 mg  300 mg Oral QHS Curlene Labrum Milanie Rosenfield, MD   300 mg at 05/08/12 2141  . paliperidone (INVEGA) 24 hr tablet 9 mg  9 mg Oral QHS Curlene Labrum Habiba Treloar, MD   9 mg at 05/08/12 2141  . Paliperidone Palmitate SUSP 117 mg  117 mg Intramuscular Q30 days Ronny Bacon, MD   117 mg at 05/06/12 1153  . pantoprazole (PROTONIX) EC tablet 40 mg  40 mg Oral QHS Curlene Labrum Vicent Febles, MD   40  mg at 05/08/12 2142  . polyethylene glycol (MIRALAX / GLYCOLAX) packet 17 g  17 g Oral Daily Curlene Labrum Franci Oshana, MD      . sertraline (ZOLOFT) tablet 100 mg  100 mg Oral Daily Curlene Labrum Naiyana Barbian, MD   100 mg at 05/09/12 0805  . simvastatin (ZOCOR) tablet 40 mg  40 mg Oral QHS Curlene Labrum Nalina Yeatman, MD   40 mg at 05/08/12 2141  . traZODone (DESYREL) tablet 150 mg  150 mg Oral QHS Sanjuana Kava, NP   150 mg at 05/08/12 2141  . vitamin E capsule 400 Units  400 Units Oral Daily Ronny Bacon, MD   400 Units at 05/09/12 1610   Lab Results:  Results for orders placed during the hospital encounter of 05/03/12 (from the past 48 hour(s))  GLUCOSE, CAPILLARY     Status: Abnormal   Collection Time   05/07/12  8:28 PM      Component Value Range Comment   Glucose-Capillary 146 (*) 70 - 99 mg/dL    Comment 1 Notify RN     GLUCOSE, CAPILLARY     Status: Abnormal   Collection Time   05/08/12  6:03 AM      Component Value Range Comment   Glucose-Capillary 115 (*) 70 - 99 mg/dL   GLUCOSE, CAPILLARY     Status: Abnormal   Collection Time   05/08/12 12:00 PM      Component Value Range Comment   Glucose-Capillary 120 (*) 70 - 99 mg/dL    Comment 1 Notify RN     GLUCOSE, CAPILLARY     Status: Abnormal   Collection Time   05/08/12  5:28 PM      Component Value Range Comment   Glucose-Capillary 108 (*) 70 - 99 mg/dL   GLUCOSE, CAPILLARY     Status: Abnormal   Collection Time   05/08/12  8:27 PM      Component Value Range Comment   Glucose-Capillary 199 (*) 70 - 99 mg/dL    Comment 1 Notify RN     GLUCOSE, CAPILLARY     Status: Abnormal   Collection Time   05/09/12  6:07 AM      Component Value Range Comment   Glucose-Capillary 116 (*) 70 - 99 mg/dL   GLUCOSE, CAPILLARY     Status: Abnormal   Collection Time   05/09/12 12:14 PM      Component Value Range Comment   Glucose-Capillary 120 (*) 70 - 99 mg/dL    Comment 1 Notify RN     GLUCOSE, CAPILLARY     Status: Abnormal   Collection Time  05/09/12  5:05  PM      Component Value Range Comment   Glucose-Capillary 165 (*) 70 - 99 mg/dL    Comment 1 Notify RN      Physical Findings: AIMS: Facial and Oral Movements Muscles of Facial Expression: None, normal Lips and Perioral Area: None, normal Jaw: None, normal Tongue: None, normal,Extremity Movements Upper (arms, wrists, hands, fingers): None, normal Lower (legs, knees, ankles, toes): None, normal, Trunk Movements Neck, shoulders, hips: None, normal, Overall Severity Severity of abnormal movements (highest score from questions above): None, normal Incapacitation due to abnormal movements: None, normal Patient's awareness of abnormal movements (rate only patient's report): No Awareness, Dental Status Current problems with teeth and/or dentures?: No Does patient usually wear dentures?: No  CIWA:  CIWA-Ar Total: 5  COWS:  COWS Total Score: 1   Review of Systems:  Neurological: The patient denies any headaches today. She denies any seizures or dizziness.  G.I.: The patient denies any constipation today or G.I. Upset.  Musculoskeletal: The patient denies any muscle or skeletal difficulties.   Time was spent today discussing with the patient her current symptoms. The patient reports to having significant difficulty initiating and maintaining sleep for the last two days.  She reports a good appetite and reports moderate feelings of sadness, anhedonia and depressed mood today. She reports ongoing suicidal ideations with plan and possible intent and again cannot contract for safety today.  She reports homicidal ideations directed toward this Physician and calmly states to his provider that she feels the need to "beat you to death" in order to feel better.  The patient reports mild anxiety symptoms as well as ongoing command auditory hallucinations instructing her to harm herself. The patient denies any visual hallucinations or delusional thinking today. She denies any medication related side effects  today.   Treatment Plan Summary:  1. Daily contact with patient to assess and evaluate symptoms and progress in treatment.  2. Medication management  3. The patient will deny suicidal ideations or homicidal ideations for 48 hours prior to discharge and have a depression and anxiety rating of 3 or less. The patient will also deny any auditory or visual hallucinations or delusional thinking.  4. The patient will deny any symptoms of substance withdrawal at time of discharge.   Plan:  1. Will continue the medication Invega at 9 mgs po qhs to further address her psychotic symptoms.  2. Will continue the medication Paliperidone Palmitate 115 mgs IM q 30 days with the injection given today.  3. Will continue the medication Trileptal at 300 mgs po qhs for mood stabilization.  4. Will continue the medication Cogentin at 2 mgs po q am and hs for EPS.  5. Will continue the medication Zoloft 100 mgs po q am for depression and anxiety.  6. Will continue the patient on the medication Trazodone but will change from prn to stating dosage of 150 mgs po qhs for sleep.  7. Will continue the patient on her non-psychiatric medications as listed above.  8. Will continue the patient on her 1 to 1 until further stabilized.  9. Laboratory studies reviewed.  10. Will continue to monitor.  11. Will recommend referral to [facility] for longer term care.  Deontaye Civello 05/09/2012, 5:34 PM

## 2012-05-09 NOTE — Progress Notes (Signed)
05/09/2012         Time: 0930      Group Topic/Focus: The focus of this group is on discussing various aspects of wellness, balancing those aspects and exploring ways to increase the ability to experience wellness.  Participation Level: Active  Participation Quality: Appropriate and Attentive  Affect: Appropriate  Cognitive: Alert   Additional Comments: None.   Tykel Badie 05/09/2012 11:55 AM  

## 2012-05-09 NOTE — Progress Notes (Signed)
Psychoeducational Group Note  Date:  05/08/2012 Time:  2000  Group Topic/Focus:  karaoke  Participation Level: Did Not Attend  Participation Quality:  Not Applicable  Affect:  Not Applicable  Cognitive:  Not Applicable  Insight:  Not Applicable  Engagement in Group: Not Applicable  Additional Comments:    Flonnie Hailstone 05/09/2012, 1:47 AM

## 2012-05-09 NOTE — Progress Notes (Signed)
Chaplain follow up with pt.  Chaplain familiar with pt from inpatient admission on [place] [facility] [address].  Encountered pt in dayroom at [facility] while rounding on another pt.   Chaplain provided brief support.  Pt spoke with chaplain about sisters visiting and reported that their visit was helpful.  Pt described talking about "things that have been hard in the family" and reported that their conversation was difficult but brought some peace.   Will continue to follow as appropriate for emotional / spiritual support.  Support around grief / SI / AH.     Belva Crome  MDiv, Chaplain

## 2012-05-09 NOTE — Progress Notes (Signed)
Patient ID: [name], female   DOB: [birth year], 56 y.o.   MRN: [id number] Pt resting in bed with eyes closed.  RR equal and unlabored.  Sitter at bedside per MD order.  Pt's safety maintained on unit.

## 2012-05-09 NOTE — Progress Notes (Addendum)
0130: Patient in bed awake resting but no complaints at this time.  Patient states she thinks she will fall asleep soon.  Patient still haveint thoughts of hurting herself and her doctor. 1:1 will continue per MD order. Patient remains safe on the unit at this time. 0330: Patient asleep in bed with eyes closed.  Respirations even and unlabored. In no apparent distress at this time. 1:1 remains for patient safety. Patient remains safe on the unit at this time. 0530: Patient in bed resting with eyes closed.  Respirations even and unlabored. In no apparent distress. 1:1 continues for patient safety. Patient remains safe on the unit at this time. 0630: Patient in room resting with eyes with eyes closed on approach.  Writer had to ask patient to wake up several time and patient sat up in bed.  Scheduled medications administered at this time. Patient taking medication. Patient states she is still positive for auditory hallucinations stating the voices tell her to burn or cut herself.  Patient states she still wants to hurt the MD. Patient cannot contract for safety.  1:1 continues for safety. Patient remains safe on the unit at this time.

## 2012-05-09 NOTE — Progress Notes (Addendum)
1:1 Note D: Patient currently in day room attending Discharge Planning Group. Patient says she doesn't have any questions or concerns at this time. A: Patient given emotional support from RN. R: Patient remains cooperative. Will continue to monitor patient for safety.

## 2012-05-09 NOTE — Progress Notes (Signed)
D: Patient denies SI but reports having visual hallucinations (states that she saw her father standing in her room with a gun) and homicidal ideation towards MD (states that she wants to "beat Dr. Allena Katz to death"). The patient has a depressed mood and affect. The patient is rating her depression and hopelessness both an 8 out of 10 (1 low/10 high). The patient reports sleeping fairly well and states that her appetite and energy level are good. The patient states that she wants to "stay on 'her' meds, get involved in activities and socialize more" when she leaves this facility.  A: Patient given emotional support from RN. Patient encouraged to come to staff with concerns and/or questions. Patient's medication routine continued. Patient's orders and plan of care reviewed.  R: Patient remains appropriate and cooperative. Will continue to monitor patient q15 minutes for safety.

## 2012-05-09 NOTE — Progress Notes (Addendum)
1:1 Note D: Patient in room resting with sitter at bedside. Patient has no questions or concerns at this time. A: Patient given emotional support by RN. R: Will continue to monitor for safety.

## 2012-05-09 NOTE — Progress Notes (Signed)
1:1 Note D: Patient currently in room eating lunch. Patient says she doesn't have any questions or concerns at this time. A: Patient given emotional support from RN. R: Patient remains cooperative. Will continue to monitor patient for safety.

## 2012-05-09 NOTE — Progress Notes (Signed)
Pt attended discharge planning group and actively participated in group.  SW provided pt with today's workbook.  Pt presents with flat affect and depressed mood.  Pt rates depression at a 7 and anxiety at a 6 today.  Pt continues to endorse SI and HI.  Pt states that she saw her dad in her room trying to kill her and her mom having schizophrenia and standing over her trying to kill her last night.  Pt continues to endorse HI towards Dr. Allena Katz but states that she doesn't think he is trying to hypnotize her as much, after talking with him about it this morning.  Pt appears to be showing improvement today. Treatment team discussed making a [facility] referral as pt is not getting better at this time.  Referral made today to [facility].  Pt is hopeful she can discharge Monday or Tuesday.  No further needs voiced by pt at this time.    [name], LCSWA 05/09/2012  10:31 AM    Per State Regulation 482.30 This Chart was reviewed for medical necessity with respect to the patient's Admission/Duration of stay.   [name]  05/09/2012  Next Review Date:  05/12/12

## 2012-05-09 NOTE — Progress Notes (Signed)
Psychoeducational Group Note  Date:  05/09/2012 Time:  1100  Group Topic/Focus:  Relapse Prevention Planning:   The focus of this group is to define relapse and discuss the need for planning to combat relapse.  Participation Level:  Did Not Attend  Participation Quality:    Affect:    Cognitive:    Insight:    Engagement in Group:    Additional Comments:  Pt was in the tube room taking a bath.  [name] M 05/09/2012, 12:10 PM

## 2012-05-10 LAB — GLUCOSE, CAPILLARY
Glucose-Capillary: 119 mg/dL — ABNORMAL HIGH (ref 70–99)
Glucose-Capillary: 83 mg/dL (ref 70–99)
Glucose-Capillary: 146 mg/dL — ABNORMAL HIGH (ref 70–99)
Glucose-Capillary: 174 mg/dL — ABNORMAL HIGH (ref 70–99)

## 2012-05-10 NOTE — Progress Notes (Signed)
[facility] Group Notes:  (Counselor/Nursing/MHT/Case Management/Adjunct)  05/10/2012 8:58 PM  Type of Therapy:  wrap up   Participation Level:  Active  Participation Quality:  Appropriate, Attentive and Sharing  Affect:  Appropriate  Cognitive:  Alert  Insight:  Good  Engagement in Group:  Good  Engagement in Therapy:  Good  Modes of Intervention:  Socialization  Summary of Progress/Problems:   [name] 05/10/2012, 8:58 PM

## 2012-05-10 NOTE — Progress Notes (Signed)
Pt has been up and has been active and participating in various milieu activities throughout the day today, pt has denied any suicidality today and has said that the auditory hallucinations are not as bad and she is feeling better today, but continues on 1-1 today for falls risk, pt provided with support and encouragement and will continue to monitor

## 2012-05-10 NOTE — Progress Notes (Signed)
[facility] MD Progress Note  05/10/2012 3:15 PM  Diagnosis:  Schizophrenia, paranoid type and suicidal ideations  ADL's:  Intact  Sleep: Fair  Appetite:  Fair  Suicidal Ideation:  Patient not feeling any suicidal ideation this time and denies any plans Homicidal Ideation:  Denied homicidal ideations, intentions, or plan.  AEB (as evidenced by): Patient was seen in community hall with wheelchair with one-to-one staff.Patient reported that she has no stability when she stands on her feet. She is scared of falling down. Patient believes some of the medication making her feel lose her balance. Patient reportedly slept good last night. Patient denied any suicidal or homicidal ideation.  Mental Status Examination/Evaluation: Objective:  Appearance: Fairly Groomed and Meticulous  Eye Contact::  Fair  Speech:  Clear and Coherent  Volume:  Decreased  Mood:  Anxious and Depressed  Affect:  Blunt and Flat  Thought Process:  Coherent and Intact  Orientation:  Full  Thought Content:  WDL  Suicidal Thoughts:  No  Homicidal Thoughts:  No  Memory:  Immediate;   Fair  Judgement:  Intact  Insight:  Fair and Lacking  Psychomotor Activity:  Decreased  Concentration:  Fair  Recall:  Fair  Akathisia:  No  Handed:  Right  AIMS (if indicated):     Assets:  Communication Skills Desire for Improvement Financial Resources/Insurance Intimacy Leisure Time Physical Health Resilience  Sleep:  Number of Hours: 6.5    Vital Signs:Blood pressure 138/86, pulse 85, temperature 97.6 F (36.4 C), temperature source Oral, resp. rate 16, height 5\' 5"  (1.651 m), weight 248 lb (112.492 kg). Current Medications: Current Facility-Administered Medications  Medication Dose Route Frequency Provider Last Rate Last Dose  . acetaminophen (TYLENOL) tablet 650 mg  650 mg Oral Q6H PRN Cleotis Nipper, MD      . alum & mag hydroxide-simeth (MAALOX/MYLANTA) 200-200-20 MG/5ML suspension 30 mL  30 mL Oral Q4H PRN Cleotis Nipper,  MD   30 mL at 05/06/12 2156  . amLODipine (NORVASC) tablet 10 mg  10 mg Oral Daily Curlene Labrum Readling, MD   10 mg at 05/10/12 1610  . aspirin EC tablet 81 mg  81 mg Oral Daily Curlene Labrum Readling, MD   81 mg at 05/10/12 9604  . benztropine (COGENTIN) tablet 2 mg  2 mg Oral BH-qamhs Curlene Labrum Readling, MD   2 mg at 05/10/12 0808  . docusate sodium (COLACE) capsule 100 mg  100 mg Oral BH-qamhs Curlene Labrum Readling, MD   100 mg at 05/10/12 5409  . glipiZIDE (GLUCOTROL) tablet 10 mg  10 mg Oral BID AC Curlene Labrum Readling, MD   10 mg at 05/10/12 [phone]  . insulin aspart (novoLOG) injection 0-20 Units  0-20 Units Subcutaneous TID WC Ronny Bacon, MD   4 Units at 05/09/12 1713  . levothyroxine (SYNTHROID, LEVOTHROID) tablet 88 mcg  88 mcg Oral QAC breakfast Ronny Bacon, MD   88 mcg at 05/10/12 [phone]  . linagliptin (TRADJENTA) tablet 5 mg  5 mg Oral Daily Curlene Labrum Readling, MD   5 mg at 05/10/12 0808  . LORazepam (ATIVAN) tablet 0.5 mg  0.5 mg Oral BID PRN Verne Spurr, PA-C   0.5 mg at 05/06/12 0121  . magnesium hydroxide (MILK OF MAGNESIA) suspension 30 mL  30 mL Oral Daily PRN Cleotis Nipper, MD      . meloxicam (MOBIC) tablet 7.5 mg  7.5 mg Oral BID Curlene Labrum Readling, MD   7.5 mg at 05/10/12 0808  .  metFORMIN (GLUCOPHAGE) tablet 500 mg  500 mg Oral BID WC Curlene Labrum Readling, MD   500 mg at 05/10/12 0808  . Oxcarbazepine (TRILEPTAL) tablet 300 mg  300 mg Oral QHS Curlene Labrum Readling, MD   300 mg at 05/09/12 2104  . paliperidone (INVEGA) 24 hr tablet 9 mg  9 mg Oral QHS Curlene Labrum Readling, MD   9 mg at 05/09/12 2104  . Paliperidone Palmitate SUSP 117 mg  117 mg Intramuscular Q30 days Ronny Bacon, MD   117 mg at 05/06/12 1153  . pantoprazole (PROTONIX) EC tablet 40 mg  40 mg Oral QHS Curlene Labrum Readling, MD   40 mg at 05/09/12 2104  . polyethylene glycol (MIRALAX / GLYCOLAX) packet 17 g  17 g Oral Daily Curlene Labrum Readling, MD      . sertraline (ZOLOFT) tablet 100 mg  100 mg Oral Daily Curlene Labrum Readling, MD   100 mg at  05/10/12 0808  . simvastatin (ZOCOR) tablet 40 mg  40 mg Oral QHS Curlene Labrum Readling, MD   40 mg at 05/09/12 2104  . traZODone (DESYREL) tablet 200 mg  200 mg Oral QHS Curlene Labrum Readling, MD   200 mg at 05/09/12 2104  . vitamin E capsule 400 Units  400 Units Oral Daily Ronny Bacon, MD   400 Units at 05/10/12 0808  . DISCONTD: traZODone (DESYREL) tablet 150 mg  150 mg Oral QHS Sanjuana Kava, NP   150 mg at 05/08/12 2141    Lab Results:  Results for orders placed during the hospital encounter of 05/03/12 (from the past 48 hour(s))  GLUCOSE, CAPILLARY     Status: Abnormal   Collection Time   05/08/12  5:28 PM      Component Value Range Comment   Glucose-Capillary 108 (*) 70 - 99 mg/dL   GLUCOSE, CAPILLARY     Status: Abnormal   Collection Time   05/08/12  8:27 PM      Component Value Range Comment   Glucose-Capillary 199 (*) 70 - 99 mg/dL    Comment 1 Notify RN     GLUCOSE, CAPILLARY     Status: Abnormal   Collection Time   05/09/12  6:07 AM      Component Value Range Comment   Glucose-Capillary 116 (*) 70 - 99 mg/dL   GLUCOSE, CAPILLARY     Status: Abnormal   Collection Time   05/09/12 12:14 PM      Component Value Range Comment   Glucose-Capillary 120 (*) 70 - 99 mg/dL    Comment 1 Notify RN     GLUCOSE, CAPILLARY     Status: Abnormal   Collection Time   05/09/12  5:05 PM      Component Value Range Comment   Glucose-Capillary 165 (*) 70 - 99 mg/dL    Comment 1 Notify RN     GLUCOSE, CAPILLARY     Status: Abnormal   Collection Time   05/09/12  8:24 PM      Component Value Range Comment   Glucose-Capillary 127 (*) 70 - 99 mg/dL   GLUCOSE, CAPILLARY     Status: Abnormal   Collection Time   05/10/12  6:09 AM      Component Value Range Comment   Glucose-Capillary 119 (*) 70 - 99 mg/dL    Comment 1 Notify RN     GLUCOSE, CAPILLARY     Status: Normal   Collection Time   05/10/12 11:45 AM  Component Value Range Comment   Glucose-Capillary 83  70 - 99 mg/dL     Physical  Findings: AIMS: Facial and Oral Movements Muscles of Facial Expression: None, normal Lips and Perioral Area: None, normal Jaw: None, normal Tongue: None, normal,Extremity Movements Upper (arms, wrists, hands, fingers): None, normal Lower (legs, knees, ankles, toes): None, normal, Trunk Movements Neck, shoulders, hips: None, normal, Overall Severity Severity of abnormal movements (highest score from questions above): None, normal Incapacitation due to abnormal movements: None, normal Patient's awareness of abnormal movements (rate only patient's report): No Awareness, Dental Status Current problems with teeth and/or dentures?: No Does patient usually wear dentures?: No  CIWA:  CIWA-Ar Total: 5  COWS:  COWS Total Score: 1   Treatment Plan Summary: Daily contact with patient to assess and evaluate symptoms and progress in treatment Medication management  Plan: Continue current medication management and treatment plan.  Saydie Gerdts,JANARDHAHA R. 05/10/2012, 3:15 PM

## 2012-05-10 NOTE — Progress Notes (Signed)
Psychoeducational Group Note  Date:  05/10/2012 Time:  1515  Group Topic/Focus:  Healthy Communication:   The focus of this group is to discuss communication, barriers to communication, as well as healthy ways to communicate with others.  Participation Level:  Active  Participation Quality:  Appropriate, Resistant and Supportive  Affect:  Appropriate  Cognitive:  Appropriate  Insight:  Good  Engagement in Group:  Good  Additional Comments:  none  [name] M 05/10/2012, 3:44 PM

## 2012-05-10 NOTE — Progress Notes (Signed)
1:1 NOTE 0900 D patient states she slept well, has a good appetite, energy levels low and ability to pay attention is good, depressed 5/10 and hopeless 3/10 today, denies SI or HI today, states she needs to socialize more and be more understanding of the people in her group home., taking meds as ordered by MD, attending group. Has sitter at side w/her. A attending group, q78min safety checks continue and support offered, taking meds, sitter w/patient R patient remains safe; 1:1 continues.

## 2012-05-10 NOTE — Progress Notes (Signed)
[facility] Group Notes:  (Counselor/Nursing/MHT/Case Management/Adjunct)  05/10/2012 11 AM  Type of Therapy:  Aftercare Planning, Group Therapy, Dance/Movement Therapy   Participation Level:  Active  Participation Quality:  Appropriate  Affect:  Appropriate and Depressed  Cognitive:  Oriented  Insight:  Limited  Engagement in Group:  Limited  Engagement in Therapy:  Limited  Modes of Intervention:  Clarification, Problem-solving, Role-play, Socialization and Support  Summary of Progress/Problems: After Care: Pt. attended and participated in aftercare planning group. Pt. accepted information on suicide prevention, warning signs to look for with suicide and crisis line numbers to use. The pt. agreed to call crisis line numbers if having warning signs or having thoughts of suicide. Pt. Stated that they were feeling "good" and "green" Counseling:  Pt participated in a discussion on how to use positive coping skills Pt identified that she is a "good listener" and that she struggles with her memory. Pt spoke about wanting her sisters to be her guardians.  Gevena Mart

## 2012-05-11 DIAGNOSIS — F200 Paranoid schizophrenia: Secondary | ICD-10-CM

## 2012-05-11 LAB — GLUCOSE, CAPILLARY
Glucose-Capillary: 115 mg/dL — ABNORMAL HIGH (ref 70–99)
Glucose-Capillary: 142 mg/dL — ABNORMAL HIGH (ref 70–99)
Glucose-Capillary: 120 mg/dL — ABNORMAL HIGH (ref 70–99)
Glucose-Capillary: 163 mg/dL — ABNORMAL HIGH (ref 70–99)

## 2012-05-11 NOTE — Progress Notes (Signed)
Psychoeducational Group Note  Date:  05/11/2012 Time:  0930  Group Topic/Focus:  Making Healthy Choices:   The focus of this group is to help patients identify negative/unhealthy choices they were using prior to admission and identify positive/healthier coping strategies to replace them upon discharge.  Participation Level:  Minimal  Participation Quality:  Drowsy  Affect:  Flat  Cognitive:  Appropriate  Insight:  Limited  Engagement in Group:  Limited  Additional Comments:  Was sleepy throughout the group w/minimal participation  Roselee Culver 05/11/2012, 10:30 AM

## 2012-05-11 NOTE — Progress Notes (Signed)
NSG 1:1 note. Currently resting quietly in bed with eyes closed. 1:1 sitter noted at pts bedside per MD order. Respirations are even and unlabored. No acute distress or discomfort noted. Safety has been maintained with 1:1 observation. Will continue 1:1 observation and continue current POC.

## 2012-05-11 NOTE — Progress Notes (Signed)
[facility] Group Notes:  (Counselor/Nursing/MHT/Case Management/Adjunct)  05/11/2012 11:30 PM  Type of Therapy:  Psychoeducational Skills  Participation Level:  Minimal  Participation Quality:  Appropriate  Affect:  Appropriate  Cognitive:  Appropriate  Insight:  Limited  Engagement in Group:  Limited  Engagement in Therapy:  Limited  Modes of Intervention:  Support  Summary of Progress/Problems: Pt mentioned that she enjoyed being able to go outside as well as down to the cafeteria for meals.   [name] 05/11/2012, 11:30 PM

## 2012-05-11 NOTE — Progress Notes (Addendum)
1:1 NOTE: 0900  D slept well last nite and is still sleepy this morning, appetite is good and eating well, energy level is normal for her and ability to pay attention is good, depressed 3/10 and hopeless 0/10. Is cooperative and pleasant, denies Si or HI and at this time has no complaints A q47min safety checks continue and support offered, attending group and participates R patient remains safe on the unit, 1:1 to continue for safety

## 2012-05-11 NOTE — Progress Notes (Signed)
1:1 NOTE 1300 feeling better today  Going to group and participating somewhat, taking meds as ordered, still is weak, denies Si or HI today and 1:1 continues d/t unsteady gait. Patient continues to be safe on the unit   1:1 NOTE1700 smiling at staff and talking more, been in room/bed resting this afternoon, taking meds as ordered, CBG 120 at dinner no need for insulin, talking w/staff, went to DR and ate dinner which made her feel good about herself. 1:1 continues d/t unsteady gait. Patient continues to be safe on the unit.

## 2012-05-11 NOTE — Progress Notes (Signed)
Psychoeducational Group Note  Date:  05/11/2012 Time:  1515  Group Topic/Focus:  Crisis Planning:   The purpose of this group is to help patients create a crisis plan for use upon discharge or in the future, as needed.  Participation Level:  Did Not Attend  Participation Quality:    Affect:    Cognitive:    Insight:    Engagement in Group:    Additional Comments:  Pt was sleeping  [name] M 05/11/2012, 6:04 PM

## 2012-05-11 NOTE — Progress Notes (Signed)
Patient ID: [name], female   DOB: 24-Mar-1956, 56 y.o.   MRN: [id number] 1:1 Nursing note: The patient has a flat affect and depressed mood. She attended the evening wrap up group with minimal participation. Denied any suicidal/homicidal ideation. Encouraged to walk short distances instead of relying on wheel chair. 1:1 maintained for safety. Will continue to monitor.

## 2012-05-11 NOTE — Progress Notes (Signed)
Patient ID: [name], female   DOB: 07-Mar-1956, 56 y.o.   MRN: [id number]   [facility] Group Notes:  (Counselor/Nursing/MHT/Case Management/Adjunct)  05/11/2012 11 AM  Type of Therapy:  Aftercare Planning, Group Therapy, Dance/Movement Therapy   Participation Level:  Active  Participation Quality:  Appropriate  Affect:  Appropriate  Cognitive:  Appropriate  Insight:  Limited  Engagement in Group:  Good  Engagement in Therapy:  Good  Modes of Intervention:  Clarification, Problem-solving, Role-play, Socialization and Support  Summary of Progress/Problems: After Care: Pt. attended and participated in aftercare planning group. Pt. verbally accepted information on suicide prevention, warning signs to look for with suicide and crisis line numbers to use. Pt shared that she was feeling good today.   Counseling:  Therapist and group members discussed how we support ourselves and how we can express ourselves using our bodies. Group members responded to music and rhythm and therapist encouraged group members to support themselves with music and that we can change how we feel by doing something different.    [name] 05/11/2012. 11:44 AM

## 2012-05-11 NOTE — Progress Notes (Signed)
[facility] MD Progress Note  05/11/2012 11:41 AM  Diagnosis:  Axis I: Chronic Paranoid Schizophrenia  ADL's:  Impaired  Sleep: Fair  Appetite:  Fair  Suicidal Ideation:  Patient denied suicidal ideation, intentions or plans Homicidal Ideation:  No homicidal ideation, intention, or plan  AEB (as evidenced by): Patient was continued to have a 1-1 services because he was not able to stay steady on her feet. Patient stated she tried to go to their bathroom, but she has to hang on to the warts. Patient was unwilling safe and able to participate and unit activities. Patient has been compliant with her medication without adverse effects. Patient reported mood is depressed and her speech is soft low volume  Mental Status Examination/Evaluation: Objective:  Appearance: Casual and Fairly Groomed  Eye Contact::  Fair  Speech:  Clear and Coherent, Normal Rate and Slow  Volume:  Decreased  Mood:  Anxious and Depressed  Affect:  Blunt and Flat  Thought Process:  Coherent and Goal Directed  Orientation:  Full  Thought Content:  WDL  Suicidal Thoughts:  No  Homicidal Thoughts:  No  Memory:  Immediate;   Fair  Judgement:  Fair  Insight:  Lacking  Psychomotor Activity:  Normal  Concentration:  Fair  Recall:  Fair  Akathisia:  No  Handed:  Right  AIMS (if indicated):     Assets:  Communication Skills Desire for Improvement Social Support Transportation  Sleep:  Number of Hours: 6.75    Vital Signs:Blood pressure 135/92, pulse 109, temperature 97.3 F (36.3 C), temperature source Oral, resp. rate 18, height 5\' 5"  (1.651 m), weight 248 lb (112.492 kg). Current Medications: Current Facility-Administered Medications  Medication Dose Route Frequency Provider Last Rate Last Dose  . acetaminophen (TYLENOL) tablet 650 mg  650 mg Oral Q6H PRN Cleotis Nipper, MD      . alum & mag hydroxide-simeth (MAALOX/MYLANTA) 200-200-20 MG/5ML suspension 30 mL  30 mL Oral Q4H PRN Cleotis Nipper, MD   30 mL at  05/06/12 2156  . amLODipine (NORVASC) tablet 10 mg  10 mg Oral Daily Curlene Labrum Readling, MD   10 mg at 05/11/12 0816  . aspirin EC tablet 81 mg  81 mg Oral Daily Curlene Labrum Readling, MD   81 mg at 05/11/12 0816  . benztropine (COGENTIN) tablet 2 mg  2 mg Oral BH-qamhs Curlene Labrum Readling, MD   2 mg at 05/11/12 0817  . docusate sodium (COLACE) capsule 100 mg  100 mg Oral BH-qamhs Curlene Labrum Readling, MD   100 mg at 05/11/12 0816  . glipiZIDE (GLUCOTROL) tablet 10 mg  10 mg Oral BID AC Curlene Labrum Readling, MD   10 mg at 05/11/12 1610  . insulin aspart (novoLOG) injection 0-20 Units  0-20 Units Subcutaneous TID WC Ronny Bacon, MD   3 Units at 05/10/12 1728  . levothyroxine (SYNTHROID, LEVOTHROID) tablet 88 mcg  88 mcg Oral QAC breakfast Ronny Bacon, MD   88 mcg at 05/11/12 9604  . linagliptin (TRADJENTA) tablet 5 mg  5 mg Oral Daily Curlene Labrum Readling, MD   5 mg at 05/11/12 0816  . LORazepam (ATIVAN) tablet 0.5 mg  0.5 mg Oral BID PRN Verne Spurr, PA-C   0.5 mg at 05/06/12 0121  . magnesium hydroxide (MILK OF MAGNESIA) suspension 30 mL  30 mL Oral Daily PRN Cleotis Nipper, MD      . meloxicam (MOBIC) tablet 7.5 mg  7.5 mg Oral BID Curlene Labrum Readling,  MD   7.5 mg at 05/11/12 0817  . metFORMIN (GLUCOPHAGE) tablet 500 mg  500 mg Oral BID WC Curlene Labrum Readling, MD   500 mg at 05/11/12 0816  . Oxcarbazepine (TRILEPTAL) tablet 300 mg  300 mg Oral QHS Curlene Labrum Readling, MD   300 mg at 05/10/12 2104  . paliperidone (INVEGA) 24 hr tablet 9 mg  9 mg Oral QHS Curlene Labrum Readling, MD   9 mg at 05/10/12 2103  . Paliperidone Palmitate SUSP 117 mg  117 mg Intramuscular Q30 days Ronny Bacon, MD   117 mg at 05/06/12 1153  . pantoprazole (PROTONIX) EC tablet 40 mg  40 mg Oral QHS Curlene Labrum Readling, MD   40 mg at 05/10/12 2103  . polyethylene glycol (MIRALAX / GLYCOLAX) packet 17 g  17 g Oral Daily Curlene Labrum Readling, MD      . sertraline (ZOLOFT) tablet 100 mg  100 mg Oral Daily Curlene Labrum Readling, MD   100 mg at 05/11/12 0817  .  simvastatin (ZOCOR) tablet 40 mg  40 mg Oral QHS Curlene Labrum Readling, MD   40 mg at 05/10/12 2103  . traZODone (DESYREL) tablet 200 mg  200 mg Oral QHS Curlene Labrum Readling, MD   200 mg at 05/10/12 2104  . vitamin E capsule 400 Units  400 Units Oral Daily Ronny Bacon, MD   400 Units at 05/11/12 [phone]    Lab Results:  Results for orders placed during the hospital encounter of 05/03/12 (from the past 48 hour(s))  GLUCOSE, CAPILLARY     Status: Abnormal   Collection Time   05/09/12 12:14 PM      Component Value Range Comment   Glucose-Capillary 120 (*) 70 - 99 mg/dL    Comment 1 Notify RN     GLUCOSE, CAPILLARY     Status: Abnormal   Collection Time   05/09/12  5:05 PM      Component Value Range Comment   Glucose-Capillary 165 (*) 70 - 99 mg/dL    Comment 1 Notify RN     GLUCOSE, CAPILLARY     Status: Abnormal   Collection Time   05/09/12  8:24 PM      Component Value Range Comment   Glucose-Capillary 127 (*) 70 - 99 mg/dL   GLUCOSE, CAPILLARY     Status: Abnormal   Collection Time   05/10/12  6:09 AM      Component Value Range Comment   Glucose-Capillary 119 (*) 70 - 99 mg/dL    Comment 1 Notify RN     GLUCOSE, CAPILLARY     Status: Normal   Collection Time   05/10/12 11:45 AM      Component Value Range Comment   Glucose-Capillary 83  70 - 99 mg/dL   GLUCOSE, CAPILLARY     Status: Abnormal   Collection Time   05/10/12  5:13 PM      Component Value Range Comment   Glucose-Capillary 146 (*) 70 - 99 mg/dL   GLUCOSE, CAPILLARY     Status: Abnormal   Collection Time   05/10/12  9:05 PM      Component Value Range Comment   Glucose-Capillary 174 (*) 70 - 99 mg/dL   GLUCOSE, CAPILLARY     Status: Abnormal   Collection Time   05/11/12  6:05 AM      Component Value Range Comment   Glucose-Capillary 115 (*) 70 - 99 mg/dL     Physical Findings: AIMS:  Facial and Oral Movements Muscles of Facial Expression: None, normal Lips and Perioral Area: None, normal Jaw: None, normal Tongue:  None, normal,Extremity Movements Upper (arms, wrists, hands, fingers): None, normal Lower (legs, knees, ankles, toes): None, normal, Trunk Movements Neck, shoulders, hips: None, normal, Overall Severity Severity of abnormal movements (highest score from questions above): None, normal Incapacitation due to abnormal movements: None, normal Patient's awareness of abnormal movements (rate only patient's report): No Awareness, Dental Status Current problems with teeth and/or dentures?: No Does patient usually wear dentures?: No  CIWA:  CIWA-Ar Total: 5  COWS:  COWS Total Score: 1   Treatment Plan Summary: Daily contact with patient to assess and evaluate symptoms and progress in treatment Medication management  Plan: Patient will continue her current and plan, and medication management without changes during this evaluation. Disposition plans as per the primary team.  Clydell Sposito,JANARDHAHA R. 05/11/2012, 11:41 AM

## 2012-05-12 LAB — GLUCOSE, CAPILLARY
Glucose-Capillary: 116 mg/dL — ABNORMAL HIGH (ref 70–99)
Glucose-Capillary: 108 mg/dL — ABNORMAL HIGH (ref 70–99)
Glucose-Capillary: 134 mg/dL — ABNORMAL HIGH (ref 70–99)
Glucose-Capillary: 115 mg/dL — ABNORMAL HIGH (ref 70–99)

## 2012-05-12 MED ORDER — ZOLPIDEM TARTRATE 5 MG PO TABS
5.0000 mg | ORAL_TABLET | Freq: Every day | ORAL | Status: DC
  Administered 2012-05-12 – 2012-05-14 (×3): 5 mg via ORAL
  Filled 2012-05-12 (×3): qty 1

## 2012-05-12 MED ORDER — SERTRALINE HCL 50 MG PO TABS
150.0000 mg | ORAL_TABLET | Freq: Every day | ORAL | Status: DC
  Administered 2012-05-13: 150 mg via ORAL
  Filled 2012-05-12 (×3): qty 1

## 2012-05-12 NOTE — Progress Notes (Signed)
Patient is ambulating well; her gait is slow and steady.  She denies any suicidal or homicidal thoughts today.  Her 1:1 observation was discontinued at 1330 today due to low fall risk and she has no thoughts of self harm.  Patient has been attending groups.  She has complained of some anxiety due to the fact that she will be discharged soon.  She plans to return to [facility] which is the group home that she was living at before admission here.  It is possible she will be discharged Wednesday of this week.  She rates her depresseion as a 3.  She reports her sleep is still poor.  Continue to monitor medication management and MD orders.  Collaborate with treatment team members regarding patient's POC.  Safety checks continued every 15 minutes.  Patient is interacting well with staff and peers on the unit.  Her behavior is appropriate.

## 2012-05-12 NOTE — Progress Notes (Signed)
05/12/2012      Time: 0930      Group Topic/Focus: The focus of this group is on discussing various styles of communication and communicating assertively using 'I' (feeling) statements.  Participation Level: Active  Participation Quality: Redirectable  Affect: Blunted  Cognitive: Alert   Additional Comments: Patient made a few inappropriate comments, appeared drowsy, participated with encouragement.   [name] 05/12/2012 1:51 PM

## 2012-05-12 NOTE — Progress Notes (Signed)
Psychoeducational Group Note  Date:  05/12/2012 Time:  1100  Group Topic/Focus:  Self Care:   The focus of this group is to help patients understand the importance of self-care in order to improve or restore emotional, physical, spiritual, interpersonal, and financial health.  Participation Level:  Minimal  Participation Quality:  Appropriate  Affect:  Appropriate  Cognitive:  Appropriate  Insight:  Limited  Engagement in Group:  Limited  Additional Comments:  Pt couldn't participate much in group due to the fact that the pt had to leave group early to meet with the MD.  Evlyn Clines, Manjinder Breau E 05/12/2012, 12:36 PM

## 2012-05-12 NOTE — Tx Team (Signed)
Interdisciplinary Treatment Plan Update (Adult)  Date:  05/12/2012  Time Reviewed:  11:04 AM   Progress in Treatment: Attending groups: Yes Participating in groups:  Yes Taking medication as prescribed: Yes Tolerating medication:  Yes Family/Significant other contact made:  N/A Patient understands diagnosis:  Yes Discussing patient identified problems/goals with staff:  Yes Medical problems stabilized or resolved:  Yes Denies suicidal/homicidal ideation: Yes Issues/concerns per patient self-inventory:  None identified Other: N/A  New problem(s) identified: None Identified  Reason for Continuation of Hospitalization: Anxiety Depression Auditory Hallcuinations Medication stabilization  Interventions implemented related to continuation of hospitalization: mood stabilization, medication monitoring and adjustment, group therapy and psycho education, safety checks q 15 mins  Additional comments: N/A  Estimated length of stay: 1-3 days  Discharge Plan: SW will return to [facility] and [facility].    New goal(s): N/A  Review of initial/current patient goals per problem list:   1. Goal(s): Reduce depressive and anxiety symptoms  Met: No  Target date: by discharge  As evidenced by: Reducing depression from a 10 to a 3 as reported by pt. Pt rates depression at a 4 and anxiety at a 2 today.  2. Goal (s): Eliminate Suicidal Ideation/Homicidal Ideation  Met: Yes Target date: by discharge  As evidenced by: Eliminate suicidal ideation/homicidal ideation. Pt continues to endorse SI/HI. 3. Goal(s): Reduce auditory hallucinations  Met: No  Target date: by discharge  As evidenced by: Reduce voices to baseline, as reported by pt. Pt states that voices have decreased.  Attendees: Patient:  [name] 05/12/2012 11:19 AM   Family:     Physician:  Franchot Gallo, MD  05/12/2012  11:04 AM   Nursing:  Tacy Learn, RN 05/12/2012 11:05 AM   Case Manager:  Reyes Ivan,  LCSWA 05/12/2012  11:04 AM   Counselor:  Veto Kemps, MT-BC 05/12/2012  11:04 AM   Other:  Barrie Folk, RN 05/12/2012  11:04 AM   Other:   05/12/2012 11:05 AM   Other:     Other:      Scribe for Treatment Team:   Carmina Miller, 05/12/2012 , 11:04 AM

## 2012-05-12 NOTE — Progress Notes (Signed)
D: In dayroom, watching TV with peers on approach. Appear flat and depressed. Calm and cooperative with assessment. No acute distress noted. States she has had a good day. States she is glad the 1:1 observation is over and she feels like she has a little more freedom and privacy. Overall she feels like her mood is much improved vs admission status. States she is attending groups and is enjoying them. Continues to endorse AH, but states the voices are low and she cant comprehend them. Denies SI/HI/VH and contracts for safety. Offers no additional questions or concerns.   A: Safety has been maintained with Q15 min observation. Support and encouragement provided. POC and medications for the shift reviewed and understanding verbalized. Meds given as ordered by MD.   R: Pt remains safe with Q15 minute observation. She continues to endorse a depressed mood, but does feel like it is improving. She is complaint with medications and programming. She offers no additional questions or concerns. Will continue Q15 minute observation and continue current POC.

## 2012-05-12 NOTE — Progress Notes (Signed)
Psychoeducational Group Note  Date:  05/12/2012 Time:  2000  Group Topic/Focus:  Wrap-Up Group:   The focus of this group is to help patients review their daily goal of treatment and discuss progress on daily workbooks.  Participation Level:  Active  Participation Quality:  Appropriate  Affect:  Appropriate  Cognitive:  Alert  Insight:  Good  Engagement in Group:  Good  Additional Comments:  Patient stated she had a good day and was glad that she was able to go down to lunch. Pt. Says she likes people but they sometimes get on her nerves. Patient says that she may go home on Wednesday  [facility] 05/12/2012, 10:27 PM

## 2012-05-12 NOTE — Progress Notes (Signed)
Pt attended discharge planning group and actively participated in group.  SW provided pt with today's workbook.  Pt presents with calm mood and affect.  Pt rates depression at a 4 and anxiety at a 2 today.  Pt denies SI/HI today.  Pt appears to be doing a lot better today.  Pt states that she isn't having thoughts of hurting herself or others today.  Pt was hopeful she would be able to d/c today.  Treatment team discussed taking pt off of 1:1 and monitoring a couple more days.  Pt will follow up at [facility] and return home to [facility].  No further needs voiced by pt at this time.    Reyes Ivan, LCSWA 05/12/2012  11:25 AM    Per State Regulation 482.30 This Chart was reviewed for medical necessity with respect to the patient's Admission/Duration of stay.   Carmina Miller  05/12/2012  Next Review Date:  05/14/12

## 2012-05-12 NOTE — Progress Notes (Signed)
[facility] MD Progress Note  05/12/2012 5:44 PM  Diagnosis:  Axis I: Schizophrenia - Paranoid Type.   The patient was seen today and reports the following:   ADL's: Intact.  Sleep: The patient reports to having ongoing significant difficulty initiating and maintaining sleep.  Appetite: The patient reports that her appetite is good.   Mild>(1-10) >Severe  Hopelessness (1-10): 0  Depression (1-10): 4  Anxiety (1-10): 2   Suicidal Ideation: The patient denies any suicidal ideations today.  Plan: No  Intent: No  Means: No   Homicidal Ideation: The patient denies any homicidal ideations today either directed toward this Physician or others.  Plan: No  Intent: No.  Means: No   General Appearance/Behavior: The patient remained cooperative today with this provider.  Eye Contact: Good.  Speech: Appropriate in rate and volume with no pressuring noted today.  Motor Behavior: wnl.  Level of Consciousness: Alert and Oriented x 3.  Mental Status: Alert and Oriented x 3.  Mood: Appears mild to moderately depressed.  Affect: Moderately constricted.  Anxiety Level: Mild anxiety reported today.  Thought Process: Auditory hallucinations present but much improved.  Thought Content: The patient reports ongoing auditory hallucinations but states they are "much softer."  She denies any visual hallucinations or delusional thinking today.  Perception: Auditory hallucinations present but much improved. .  Judgment: Fair.  Insight: Fair.  Cognition: Oriented to person, place and time.  Sleep:  Number of Hours: 2.75    Vital Signs:Blood pressure 156/90, pulse 85, temperature 96.9 F (36.1 C), temperature source Oral, resp. rate 18, height 5\' 5"  (1.651 m), weight 112.492 kg (248 lb).  Current Medications: Current Facility-Administered Medications  Medication Dose Route Frequency Provider Last Rate Last Dose  . acetaminophen (TYLENOL) tablet 650 mg  650 mg Oral Q6H PRN Cleotis Nipper, MD   650 mg at  05/12/12 0335  . alum & mag hydroxide-simeth (MAALOX/MYLANTA) 200-200-20 MG/5ML suspension 30 mL  30 mL Oral Q4H PRN Cleotis Nipper, MD   30 mL at 05/06/12 2156  . amLODipine (NORVASC) tablet 10 mg  10 mg Oral Daily Curlene Labrum Keauna Brasel, MD   10 mg at 05/12/12 0802  . aspirin EC tablet 81 mg  81 mg Oral Daily Curlene Labrum Lincon Sahlin, MD   81 mg at 05/12/12 0802  . benztropine (COGENTIN) tablet 2 mg  2 mg Oral BH-qamhs Curlene Labrum Neilan Rizzo, MD   2 mg at 05/12/12 0803  . docusate sodium (COLACE) capsule 100 mg  100 mg Oral BH-qamhs Curlene Labrum Dawnn Nam, MD   100 mg at 05/12/12 0803  . glipiZIDE (GLUCOTROL) tablet 10 mg  10 mg Oral BID AC Curlene Labrum Dantonio Justen, MD   10 mg at 05/12/12 1710  . insulin aspart (novoLOG) injection 0-20 Units  0-20 Units Subcutaneous TID WC Ronny Bacon, MD   3 Units at 05/12/12 1709  . levothyroxine (SYNTHROID, LEVOTHROID) tablet 88 mcg  88 mcg Oral QAC breakfast Ronny Bacon, MD   88 mcg at 05/12/12 [phone]  . linagliptin (TRADJENTA) tablet 5 mg  5 mg Oral Daily Curlene Labrum Noah Pelaez, MD   5 mg at 05/12/12 0802  . LORazepam (ATIVAN) tablet 0.5 mg  0.5 mg Oral BID PRN Verne Spurr, PA-C   0.5 mg at 05/12/12 0010  . magnesium hydroxide (MILK OF MAGNESIA) suspension 30 mL  30 mL Oral Daily PRN Cleotis Nipper, MD      . meloxicam (MOBIC) tablet 7.5 mg  7.5 mg Oral BID Harvie Heck  D Gladie Gravette, MD   7.5 mg at 05/12/12 1710  . metFORMIN (GLUCOPHAGE) tablet 500 mg  500 mg Oral BID WC Curlene Labrum Arsalan Brisbin, MD   500 mg at 05/12/12 1710  . Oxcarbazepine (TRILEPTAL) tablet 300 mg  300 mg Oral QHS Curlene Labrum Yvone Slape, MD   300 mg at 05/11/12 2120  . paliperidone (INVEGA) 24 hr tablet 9 mg  9 mg Oral QHS Curlene Labrum Leahmarie Gasiorowski, MD   9 mg at 05/11/12 2120  . Paliperidone Palmitate SUSP 117 mg  117 mg Intramuscular Q30 days Ronny Bacon, MD   117 mg at 05/06/12 1153  . pantoprazole (PROTONIX) EC tablet 40 mg  40 mg Oral QHS Curlene Labrum Danyael Alipio, MD   40 mg at 05/11/12 2120  . polyethylene glycol (MIRALAX / GLYCOLAX) packet 17 g  17  g Oral Daily Curlene Labrum Sacheen Arrasmith, MD   17 g at 05/12/12 0803  . sertraline (ZOLOFT) tablet 150 mg  150 mg Oral Daily Curlene Labrum Presli Fanguy, MD      . simvastatin (ZOCOR) tablet 40 mg  40 mg Oral QHS Curlene Labrum Amayiah Gosnell, MD   40 mg at 05/11/12 2120  . traZODone (DESYREL) tablet 200 mg  200 mg Oral QHS Curlene Labrum Aevah Stansbery, MD   200 mg at 05/11/12 2120  . vitamin E capsule 400 Units  400 Units Oral Daily Curlene Labrum Coty Larsh, MD   400 Units at 05/12/12 0802  . zolpidem (AMBIEN) tablet 5 mg  5 mg Oral QHS Ronny Bacon, MD      . DISCONTD: sertraline (ZOLOFT) tablet 100 mg  100 mg Oral Daily Curlene Labrum Laniyah Rosenwald, MD   100 mg at 05/12/12 0803   Lab Results:  Results for orders placed during the hospital encounter of 05/03/12 (from the past 48 hour(s))  GLUCOSE, CAPILLARY     Status: Abnormal   Collection Time   05/10/12  9:05 PM      Component Value Range Comment   Glucose-Capillary 174 (*) 70 - 99 mg/dL   GLUCOSE, CAPILLARY     Status: Abnormal   Collection Time   05/11/12  6:05 AM      Component Value Range Comment   Glucose-Capillary 115 (*) 70 - 99 mg/dL   GLUCOSE, CAPILLARY     Status: Abnormal   Collection Time   05/11/12 11:42 AM      Component Value Range Comment   Glucose-Capillary 142 (*) 70 - 99 mg/dL   GLUCOSE, CAPILLARY     Status: Abnormal   Collection Time   05/11/12  5:12 PM      Component Value Range Comment   Glucose-Capillary 120 (*) 70 - 99 mg/dL    Comment 1 Notify RN     GLUCOSE, CAPILLARY     Status: Abnormal   Collection Time   05/11/12  9:07 PM      Component Value Range Comment   Glucose-Capillary 163 (*) 70 - 99 mg/dL    Comment 1 Notify RN     GLUCOSE, CAPILLARY     Status: Abnormal   Collection Time   05/12/12  5:51 AM      Component Value Range Comment   Glucose-Capillary 116 (*) 70 - 99 mg/dL   GLUCOSE, CAPILLARY     Status: Abnormal   Collection Time   05/12/12 11:45 AM      Component Value Range Comment   Glucose-Capillary 108 (*) 70 - 99 mg/dL   GLUCOSE, CAPILLARY  Status: Abnormal   Collection Time   05/12/12  5:00 PM      Component Value Range Comment   Glucose-Capillary 134 (*) 70 - 99 mg/dL    Comment 1 Notify RN      Physical Findings: AIMS: Facial and Oral Movements Muscles of Facial Expression: None, normal Lips and Perioral Area: None, normal Jaw: None, normal Tongue: None, normal,Extremity Movements Upper (arms, wrists, hands, fingers): None, normal Lower (legs, knees, ankles, toes): None, normal, Trunk Movements Neck, shoulders, hips: None, normal, Overall Severity Severity of abnormal movements (highest score from questions above): None, normal Incapacitation due to abnormal movements: None, normal Patient's awareness of abnormal movements (rate only patient's report): No Awareness, Dental Status Current problems with teeth and/or dentures?: No Does patient usually wear dentures?: No  CIWA:  CIWA-Ar Total: 5  COWS:  COWS Total Score: 1   Review of Systems:  Neurological: The patient denies any headaches today. She denies any seizures or dizziness.  G.I.: The patient denies any constipation today or G.I. Upset.  Musculoskeletal: The patient denies any muscle or skeletal difficulties.   Time was spent today discussing with the patient her current symptoms. The patient reports to having ongoing significant difficulty initiating and maintaining sleep. She reports a good appetite and reports mild to moderate feelings of sadness, anhedonia and depressed mood today.  She denies any suicidal ideations today as well as any homicidal ideations directed toward this Physician or others.  The patient reports mild anxiety symptoms and states that he auditory hallucinations are "softer." The patient denies any visual hallucinations or delusional thinking today. She denies any medication related side effects today.   It was discussed with the patient today the possibility of removing the 1 to 1 and the patient agreed to approach staff should she have  any thoughts of harming herself or others.  Treatment Plan Summary:  1. Daily contact with patient to assess and evaluate symptoms and progress in treatment.  2. Medication management  3. The patient will deny suicidal ideations or homicidal ideations for 48 hours prior to discharge and have a depression and anxiety rating of 3 or less. The patient will also deny any auditory or visual hallucinations or delusional thinking.  4. The patient will deny any symptoms of substance withdrawal at time of discharge.   Plan:  1. Will continue the medication Invega at 9 mgs po qhs to further address her psychotic symptoms.  2. Will continue the medication Paliperidone Palmitate 115 mgs IM q 30 days.  3. Will continue the medication Trileptal at 300 mgs po qhs for mood stabilization.  4. Will continue the medication Cogentin at 2 mgs po q am and hs for EPS.  5. Will increase the medication Zoloft to 150 mgs po q am for depression and anxiety.  6. Will continue the patient on the medication Trazodone at 150 mgs po qhs for sleep. 7. Will start the medication Ambien at 5 mgs po qhs for sleep.  8. Will continue the patient on her non-psychiatric medications as listed above.  9. Will discontinue the 1 to 1 today due to the patient being able to contract for safety.  10. Laboratory studies reviewed.  11. Will continue to monitor.   [name] 05/12/2012, 5:44 PM

## 2012-05-12 NOTE — Progress Notes (Signed)
[facility] Group Notes:  (Counselor/Nursing/MHT/Case Management/Adjunct)  05/12/2012 2:48 PM  Type of Therapy:  Group Therapy  Participation Level:  Active  Participation Quality:  Attentive and Sharing  Affect:  Depressed  Cognitive:  Oriented  Insight:  Limited  Engagement in Group:  Good  Engagement in Therapy:  Good  Modes of Intervention:  Education, Problem-solving and Support  Summary of Progress/Problems: Patient talked about her tendency to be suicidal. When asking for help, patient stated that if she asked for help and the person didn't help her, she would cuss her out, and if that didn't work, she would kill herself. Talked about in the past she used to have lots of knives and even a gun stored up. She no longer has this and thinks of this when she starts getting sick. Also talked about wanting to kill herself because she doesn't like taking medications. Educated her on the fact that she said the medications make her feel better, and why would she want to stop taking them. She again stated that she gets tired of taking the medications.   [name] 05/12/2012, 2:48 PM

## 2012-05-12 NOTE — Progress Notes (Signed)
Patient ID: [name], female   DOB: Apr 28, 1956, 56 y.o.   MRN: [id number] 1:1 Nursing Note: The patient has not been able to sleep. She had her scheduled sleep medication at bedtime. Was medicated with Ativan PRN, but is still feeling uneasy and unable to sleep. Denies any A/V hallucinations or suicidal/homicidal ideation. 1:1 maintained for safety. Will continue to monitor.

## 2012-05-13 LAB — GLUCOSE, CAPILLARY
Glucose-Capillary: 119 mg/dL — ABNORMAL HIGH (ref 70–99)
Glucose-Capillary: 232 mg/dL — ABNORMAL HIGH (ref 70–99)
Glucose-Capillary: 77 mg/dL (ref 70–99)
Glucose-Capillary: 109 mg/dL — ABNORMAL HIGH (ref 70–99)
Glucose-Capillary: 181 mg/dL — ABNORMAL HIGH (ref 70–99)

## 2012-05-13 MED ORDER — SERTRALINE HCL 50 MG PO TABS
150.0000 mg | ORAL_TABLET | Freq: Every day | ORAL | Status: DC
  Administered 2012-05-14 – 2012-05-15 (×2): 150 mg via ORAL
  Filled 2012-05-13 (×3): qty 3

## 2012-05-13 NOTE — Progress Notes (Signed)
[facility] Group Notes:  (Counselor/Nursing/MHT/Case Management/Adjunct)  05/13/2012 1:37 PM  Type of Therapy:  Group Therapy  Participation Level:  Active  Participation Quality:  Attentive and Sharing  Affect:  Depressed  Cognitive:  Oriented  Insight:  Limited  Engagement in Group:  Good  Engagement in Therapy:  Good  Modes of Intervention:  Clarification, Education, Problem-solving and Support  Summary of Progress/Problems: Patient talked about still having trouble keeping her thoughts organized but doing better. She talked about her pattern of getting angry with family when they weren't there for her the minute she calls. Group home staff had talked to her about this and counselor reiterated feedback of having patience with family and also not retreating back to old patterns of wanting to hurt herself when things did not go her way.    Jayle Solarz, Aram Beecham 05/13/2012, 1:37 PM

## 2012-05-13 NOTE — Progress Notes (Signed)
Patient continues to improve with her treatment.  She denies any SI/HI/AVH.  Patient can return to her group home [facility].  It is possible for her to be discharged as early as tomorrow.  She is eager for discharge.  She has been attending groups and participating in her treatment.  Continue to monitor medication management and MD orders.  Safety checks continued every 15 minutes.  Reassure and support patient; her behavior has been appropriate.

## 2012-05-13 NOTE — Progress Notes (Signed)
Pt attended discharge planning group and actively participated in group.  SW provided pt with today's workbook.  Pt presents with calm mood and affect.  Pt rates depression and anxiety at a 3 today.  Pt denies SI/HI.  Pt states that she is a little upset from meeting with the dr yesterday and finding out her diagnosis is schizophrenia.  Pt states that she doesn't agree with this and thinks it is more like the devil talking to her and trying to make her do things.  Pt states that she isn't hearing voices today.  Pt states that she feels ready to go home soon.  SW will contact [facility] and [facility] the day of d/c to arrange follow up and transportation.  No further needs voiced by pt at this time.    Reyes Ivan, LCSWA 05/13/2012  1:00 PM

## 2012-05-13 NOTE — Progress Notes (Signed)
[facility] MD Progress Note  05/13/2012 1:52 PM  S: "I am doing pretty good. I slept very well last night. But I have hard time accepting the diagnosis that Dr. Allena Katz gave me. He said that I have paranoid schizophrenia. I don't want to feel labelled because my mood is on the low side sometimes. I am now able to walk from my room to the day room. I walk very slowly. But, have to use the wheel chair when going to the cafeteria because it is a much longer distance"  Diagnosis:   Axis I: Schizophrenia, paranoid type Axis II: Deferred Axis III:  Past Medical History  Diagnosis Date  . Diabetes mellitus   . GERD (gastroesophageal reflux disease)   . Borderline personality disorder   . Schizoaffective disorder   . Depression   . Hyperlipidemia   . Chronic back pain   . Hypothyroidism   . Schizophrenia   . Schizoaffective disorder 04/29/2012  . HTN (hypertension) 04/29/2012  . Bipolar affective disorder 04/29/2012  . Diabetes mellitus 04/29/2012   Axis IV: other psychosocial or environmental problems Axis V: 61-70 mild symptoms  ADL's:  Intact  Sleep: Good  Appetite:  Good  Suicidal Ideation: "No" Plan:  No Intent:  no Means:  no Homicidal Ideation: "no" Plan:  No Intent:  no Means:  no  AEB (as evidenced by): per patient's reports.  Mental Status Examination/Evaluation: Objective:  Appearance: Casual and Well Groomed  Eye Contact::  Good  Speech:  Clear and Coherent  Volume:  Normal  Mood:  "pretty good"  Affect:  Appropriate  Thought Process:  Coherent and Intact  Orientation:  Full  Thought Content:  Rumination  Suicidal Thoughts:  No  Homicidal Thoughts:  No  Memory:  Immediate;   Good Recent;   Good Remote;   Good  Judgement:  Fair  Insight:  Fair  Psychomotor Activity:  Normal  Concentration:  Good  Recall:  Good  Akathisia:  No  Handed:  Right  AIMS (if indicated):     Assets:  Desire for Improvement  Sleep:  Number of Hours: 6.75    Vital Signs:Blood  pressure 167/91, pulse 118, temperature 97.3 F (36.3 C), temperature source Oral, resp. rate 18, height 5\' 5"  (1.651 m), weight 112.492 kg (248 lb). Current Medications: Current Facility-Administered Medications  Medication Dose Route Frequency Provider Last Rate Last Dose  . acetaminophen (TYLENOL) tablet 650 mg  650 mg Oral Q6H PRN Cleotis Nipper, MD   650 mg at 05/12/12 0335  . alum & mag hydroxide-simeth (MAALOX/MYLANTA) 200-200-20 MG/5ML suspension 30 mL  30 mL Oral Q4H PRN Cleotis Nipper, MD   30 mL at 05/06/12 2156  . amLODipine (NORVASC) tablet 10 mg  10 mg Oral Daily Curlene Labrum Readling, MD   10 mg at 05/13/12 0810  . aspirin EC tablet 81 mg  81 mg Oral Daily Curlene Labrum Readling, MD   81 mg at 05/13/12 0811  . benztropine (COGENTIN) tablet 2 mg  2 mg Oral BH-qamhs Curlene Labrum Readling, MD   2 mg at 05/13/12 0810  . docusate sodium (COLACE) capsule 100 mg  100 mg Oral BH-qamhs Curlene Labrum Readling, MD   100 mg at 05/13/12 0810  . glipiZIDE (GLUCOTROL) tablet 10 mg  10 mg Oral BID AC Curlene Labrum Readling, MD   10 mg at 05/13/12 1610  . insulin aspart (novoLOG) injection 0-20 Units  0-20 Units Subcutaneous TID WC Ronny Bacon, MD   3 Units  at 05/12/12 1709  . levothyroxine (SYNTHROID, LEVOTHROID) tablet 88 mcg  88 mcg Oral QAC breakfast Ronny Bacon, MD   88 mcg at 05/13/12 1610  . linagliptin (TRADJENTA) tablet 5 mg  5 mg Oral Daily Curlene Labrum Readling, MD   5 mg at 05/13/12 0811  . LORazepam (ATIVAN) tablet 0.5 mg  0.5 mg Oral BID PRN Verne Spurr, PA-C   0.5 mg at 05/12/12 0010  . magnesium hydroxide (MILK OF MAGNESIA) suspension 30 mL  30 mL Oral Daily PRN Cleotis Nipper, MD      . meloxicam (MOBIC) tablet 7.5 mg  7.5 mg Oral BID Curlene Labrum Readling, MD   7.5 mg at 05/13/12 0811  . metFORMIN (GLUCOPHAGE) tablet 500 mg  500 mg Oral BID WC Curlene Labrum Readling, MD   500 mg at 05/13/12 0811  . Oxcarbazepine (TRILEPTAL) tablet 300 mg  300 mg Oral QHS Curlene Labrum Readling, MD   300 mg at 05/12/12 2109  .  paliperidone (INVEGA) 24 hr tablet 9 mg  9 mg Oral QHS Curlene Labrum Readling, MD   9 mg at 05/12/12 2109  . Paliperidone Palmitate SUSP 117 mg  117 mg Intramuscular Q30 days Ronny Bacon, MD   117 mg at 05/06/12 1153  . pantoprazole (PROTONIX) EC tablet 40 mg  40 mg Oral QHS Curlene Labrum Readling, MD   40 mg at 05/12/12 2109  . polyethylene glycol (MIRALAX / GLYCOLAX) packet 17 g  17 g Oral Daily Curlene Labrum Readling, MD   17 g at 05/12/12 0803  . sertraline (ZOLOFT) tablet 150 mg  150 mg Oral Daily Curlene Labrum Readling, MD      . simvastatin (ZOCOR) tablet 40 mg  40 mg Oral QHS Curlene Labrum Readling, MD   40 mg at 05/12/12 2109  . traZODone (DESYREL) tablet 200 mg  200 mg Oral QHS Curlene Labrum Readling, MD   200 mg at 05/12/12 2109  . vitamin E capsule 400 Units  400 Units Oral Daily Curlene Labrum Readling, MD   400 Units at 05/13/12 0811  . zolpidem (AMBIEN) tablet 5 mg  5 mg Oral QHS Curlene Labrum Readling, MD   5 mg at 05/12/12 2109  . DISCONTD: sertraline (ZOLOFT) tablet 100 mg  100 mg Oral Daily Curlene Labrum Readling, MD   100 mg at 05/12/12 0803  . DISCONTD: sertraline (ZOLOFT) tablet 150 mg  150 mg Oral Daily Curlene Labrum Readling, MD   150 mg at 05/13/12 9604    Lab Results:  Results for orders placed during the hospital encounter of 05/03/12 (from the past 48 hour(s))  GLUCOSE, CAPILLARY     Status: Abnormal   Collection Time   05/11/12  5:12 PM      Component Value Range Comment   Glucose-Capillary 120 (*) 70 - 99 mg/dL    Comment 1 Notify RN     GLUCOSE, CAPILLARY     Status: Abnormal   Collection Time   05/11/12  9:07 PM      Component Value Range Comment   Glucose-Capillary 163 (*) 70 - 99 mg/dL    Comment 1 Notify RN     GLUCOSE, CAPILLARY     Status: Abnormal   Collection Time   05/12/12  5:51 AM      Component Value Range Comment   Glucose-Capillary 116 (*) 70 - 99 mg/dL   GLUCOSE, CAPILLARY     Status: Abnormal   Collection Time   05/12/12 11:45 AM  Component Value Range Comment   Glucose-Capillary 108 (*)  70 - 99 mg/dL   GLUCOSE, CAPILLARY     Status: Abnormal   Collection Time   05/12/12  5:00 PM      Component Value Range Comment   Glucose-Capillary 134 (*) 70 - 99 mg/dL    Comment 1 Notify RN     GLUCOSE, CAPILLARY     Status: Abnormal   Collection Time   05/12/12  9:15 PM      Component Value Range Comment   Glucose-Capillary 115 (*) 70 - 99 mg/dL   GLUCOSE, CAPILLARY     Status: Abnormal   Collection Time   05/13/12  6:07 AM      Component Value Range Comment   Glucose-Capillary 119 (*) 70 - 99 mg/dL    Comment 1 Notify RN     GLUCOSE, CAPILLARY     Status: Abnormal   Collection Time   05/13/12  7:45 AM      Component Value Range Comment   Glucose-Capillary 232 (*) 70 - 99 mg/dL   GLUCOSE, CAPILLARY     Status: Normal   Collection Time   05/13/12 11:57 AM      Component Value Range Comment   Glucose-Capillary 77  70 - 99 mg/dL     Physical Findings: AIMS: Facial and Oral Movements Muscles of Facial Expression: None, normal Lips and Perioral Area: None, normal Jaw: None, normal Tongue: None, normal,Extremity Movements Upper (arms, wrists, hands, fingers): None, normal Lower (legs, knees, ankles, toes): None, normal, Trunk Movements Neck, shoulders, hips: None, normal, Overall Severity Severity of abnormal movements (highest score from questions above): None, normal Incapacitation due to abnormal movements: None, normal Patient's awareness of abnormal movements (rate only patient's report): No Awareness, Dental Status Current problems with teeth and/or dentures?: No Does patient usually wear dentures?: No  CIWA:  CIWA-Ar Total: 5  COWS:  COWS Total Score: 1   Treatment Plan Summary: Daily contact with patient to assess and evaluate symptoms and progress in treatment Medication management  Plan: Continue current treatment plan.  Armandina Stammer I 05/13/2012, 1:52 PM

## 2012-05-13 NOTE — Progress Notes (Signed)
Psychoeducational Group Note  Date:  05/13/2012 Time:  1100 Group Topic/Focus:  Recovery Goals:   The focus of this group is to identify appropriate goals for recovery and establish a plan to achieve them.  Participation Level:  Active  Participation Quality:  Appropriate  Affect:  Appropriate  Cognitive:  Appropriate  Insight:  Good  Engagement in Group:  Good  Additional Comments:  Pt actively participated in group this morning and also shared positively with the group.  Nehemiah Montee E 05/13/2012, 1:52 PM

## 2012-05-13 NOTE — Progress Notes (Signed)
[facility] Group Notes:  (Counselor/Nursing/MHT/Case Management/Adjunct)  05/13/2012 8:34 PM  Type of Therapy:  Psychoeducational Skills  Participation Level:  Active  Participation Quality:  Appropriate and Attentive  Affect:  Appropriate  Cognitive:  Appropriate  Insight:  Good  Engagement in Group:  Good  Engagement in Therapy:  Good  Modes of Intervention:  Support  Summary of Progress/Problems: Pt. attended and participated. Pt. was appropriate during group, pt. Stated she hopes to be going home tomorrow and felt like she is ready  Gwenevere Ghazi Patience 05/13/2012, 8:34 PM

## 2012-05-13 NOTE — Progress Notes (Signed)
D: In dayroom, watching TV on approach. Appears blunted and depressed. Calm and cooperative with assessment. No acute distress noted. States she continues to feel better. States she has felt less depressed, but still does endorse a depressed mood. She had questions about her medication and specifically asked if she was on an anti-depressant. Continues to endorse AH: Voices to low to comprehend, but feels like they are improving as well. Denies SI/HI/VH and contracts for safety. She offered no additional questions or concerns.  A: Safety has been maintained with Q15 min observation. Support and encouragement provided. POC and medications for the shift reviewed and understanding verbalized. Meds given as ordered by MD. Discussed her medications and the fact that she is taking an anti-depressant. Also explained indications for her meds as well.  R: Pt remains safe with Q15 minute observation. She continues to endorse a depressed mood, but does feel like it is improving. She is complaint with medications and programming. She offers no additional questions or concerns. Will continue Q15 minute observation and continue current POC

## 2012-05-14 LAB — GLUCOSE, CAPILLARY
Glucose-Capillary: 117 mg/dL — ABNORMAL HIGH (ref 70–99)
Glucose-Capillary: 117 mg/dL — ABNORMAL HIGH (ref 70–99)
Glucose-Capillary: 111 mg/dL — ABNORMAL HIGH (ref 70–99)
Glucose-Capillary: 141 mg/dL — ABNORMAL HIGH (ref 70–99)

## 2012-05-14 NOTE — Progress Notes (Signed)
D:  Pt about on the unit today.  She has a flat affect/depressed mood.  She denies SI/HI.  Says she hears slight mumbling.  She rates depression as a 0/10 and feelings of hopelessness as a 0/10.  She says she feels better and feels like she is ready to go back to the group home.  CBG 117.  VS stable.  Is pleasant and has been attending groups.  A:  Offered support and encouragement.  Given meds as prescribed.  R:  Pt. Receptive to staff.  Will continue to monitor.  Remains on q 15 minute checks.

## 2012-05-14 NOTE — [facility] Suicide Risk Assessment (Signed)
Suicide Risk Assessment  Discharge Assessment     Demographic factors:  Caucasian;Low socioeconomic status;Unemployed  Current Mental Status Per Nursing Assessment::   On Admission:  Self-harm thoughts At Discharge:  Time was spent today discussing with the patient her current symptoms. The patient states that she is sleeping very well and reports a good appetite. The patient reports some mild feelings of sadness, anhedonia and depressed mood but adamantly denies any suicidal or homicidal ideations. She adamantly denies any visual hallucinations or delusional thinking and states her auditory hallucinations are much improved to where they are now a "slight mumble" which she cannot make out.  She also reports mild anxiety symptoms and denies any feelings of hopelessness. The patient also denies any medications related side effects.   The patient states that she feels ready for discharge and would like to arrange discharge in the morning if possible. This will be ordered and the patient's Case Manager will arrange follow up for the patient as well as transportation to return to her group home.  She will be discharged in the morning to outpatient follow up if her symptoms remain stable.  Current Mental Status Per Physician:  Diagnosis:  Axis I: Schizophrenia - Paranoid Type.   The patient was seen today and reports the following:   ADL's: Intact.  Sleep: The patient reports to sleeping very well at night with the medication Ambien.  Appetite: The patient reports that her appetite is good.   Mild>(1-10) >Severe  Hopelessness (1-10): 0  Depression (1-10): 3  Anxiety (1-10): 3   Suicidal Ideation: The patient adamantly denies any suicidal ideations today.  Plan: No  Intent: No  Means: No   Homicidal Ideation: The patient adamantly denies any homicidal ideations today.  Plan: No  Intent: No.  Means: No   General Appearance/Behavior: The patient remained friendly and cooperative today  with this provider.  Eye Contact: Good.  Speech: Appropriate in rate and volume with no pressuring noted today.  Motor Behavior: wnl.  Level of Consciousness: Alert and Oriented x 3.  Mental Status: Alert and Oriented x 3.  Mood: Appears mildly depressed.  Affect: Mildly constricted.  Anxiety Level: Mild anxiety reported today.  Thought Process: Auditory hallucinations present but much improved to where they are now "a "slight mumble" which she cannot make out.  Thought Content: The patient reports auditory hallucinations present but much improved to where they are now "a "slight mumble" which she cannot make out.  She denies any visual hallucinations or delusional thinking today.  Perception: Auditory hallucinations present but much improved to where they are now "a "slight mumble" which she cannot make out.  Judgment: Fair to Good.  Insight: Fair to Good.  Cognition: Oriented to person, place and time.   Loss Factors: Decline in physical health;Financial problems / change in socioeconomic status  Historical Factors: Prior suicide attempts;Anniversary of important loss;Impulsivity.  Long history of mental illness.  Risk Reduction Factors:   Good support from Group Home.  The patient is followed by an ACT team.  Good access to healthcare.  Continued Clinical Symptoms:  Depression:   Anhedonia Chronic Pain Previous Psychiatric Diagnoses and Treatments Medical Diagnoses and Treatments/Surgeries  Discharge Diagnoses:   AXIS I:   Schizophrenia - Paranoid Type. AXIS II:   Deferred. AXIS III:  1.  Diabetes Mellitus.   2.  Gastroesophageal Reflux Disease.   3.  Hyperlipidemia.   4.  Chronic Back Pain.   5.  Hypothyroidism.   6. Hypertension. AXIS  IV:   Chronic Mental Illness.  Chronic Non-psychiatric Medical Issues. AXIS V:   GAF at time of admission approximately 35.  GAF at time of discharge approximately 50.  Cognitive Features That Contribute To Risk:  None Noted.    Current Medications:  . amLODipine  10 mg Oral Daily  . aspirin EC  81 mg Oral Daily  . benztropine  2 mg Oral BH-qamhs  . docusate sodium  100 mg Oral BH-qamhs  . glipiZIDE  10 mg Oral BID AC  . insulin aspart  0-20 Units Subcutaneous TID WC  . levothyroxine  88 mcg Oral QAC breakfast  . linagliptin  5 mg Oral Daily  . meloxicam  7.5 mg Oral BID  . metFORMIN  500 mg Oral BID WC  . Oxcarbazepine  300 mg Oral QHS  . paliperidone  9 mg Oral QHS  . Paliperidone Palmitate  117 mg Intramuscular Q30 days  . pantoprazole  40 mg Oral QHS  . polyethylene glycol  17 g Oral Daily  . sertraline  150 mg Oral Daily  . simvastatin  40 mg Oral QHS  . traZODone  200 mg Oral QHS  . vitamin E  400 Units Oral Daily  . zolpidem  5 mg Oral QHS   Physical Findings:  AIMS: Facial and Oral Movements  Muscles of Facial Expression: None, normal  Lips and Perioral Area: None, normal  Jaw: None, normal  Tongue: None, normal,Extremity Movements  Upper (arms, wrists, hands, fingers): None, normal  Lower (legs, knees, ankles, toes): None, normal, Trunk Movements  Neck, shoulders, hips: None, normal, Overall Severity  Severity of abnormal movements (highest score from questions above): None, normal  Incapacitation due to abnormal movements: None, normal  Patient's awareness of abnormal movements (rate only patient's report): No Awareness, Dental Status  Current problems with teeth and/or dentures?: No  Does patient usually wear dentures?: No  CIWA: CIWA-Ar Total: 5  COWS: COWS Total Score: 1   Review of Systems:  Neurological: The patient denies any headaches today.  She denies any seizures or dizziness.  G.I.: The patient denies any constipation today or G.I. Upset.  Musculoskeletal: The patient denies any muscle or skeletal difficulties.   Time was spent today discussing with the patient her current symptoms. The patient states that she is sleeping very well and reports a good appetite. The patient  reports some mild feelings of sadness, anhedonia and depressed mood but adamantly denies any suicidal or homicidal ideations. She adamantly denies any visual hallucinations or delusional thinking and states her auditory hallucinations are much improved to where they are now a "slight mumble" which she cannot make out.  She also reports mild anxiety symptoms and denies any feelings of hopelessness. The patient also denies any medications related side effects.   The patient states that she feels ready for discharge and would like to arrange discharge in the morning if possible. This will be ordered and the patient's Case Manager will arrange follow up for the patient as well as transportation to return to her group home.  She will be discharged in the morning to outpatient follow up if her symptoms remain stable.  Treatment Plan Summary:  1. Daily contact with patient to assess and evaluate symptoms and progress in treatment.  2. Medication management  3. The patient will deny suicidal ideations or homicidal ideations for 48 hours prior to discharge and have a depression and anxiety rating of 3 or less. The patient will also deny any auditory or  visual hallucinations or delusional thinking.  4. The patient will deny any symptoms of substance withdrawal at time of discharge.   Plan:  1. Will continue the medication Invega at 9 mgs po qhs to further address her psychotic symptoms.  2. Will continue the medication Paliperidone Palmitate 115 mgs IM q 30 days with this injection next due on June 05, 2012.  3. Will continue the medication Trileptal at 300 mgs po qhs for mood stabilization.  4. Will continue the medication Cogentin at 2 mgs po q am and hs for EPS.  5. Will continue the medication Zoloft at 150 mgs po q am for depression and anxiety.  6. Will continue the patient on the medication Trazodone at 150 mgs po qhs for sleep.  7. Will continue the patient on the medication Ambien at 5 mgs po  qhs for sleep.  8. Will continue the patient on her non-psychiatric medications as listed above.  9. Laboratory studies reviewed.  10. Will continue to monitor.  11. The patient will be discharged in the morning as requested to outpatient follow up.  Suicide Risk:  Minimal: No identifiable suicidal ideation.  Patients presenting with no risk factors but with morbid ruminations; may be classified as minimal risk based on the severity of the depressive symptoms  Plan Of Care/Follow-up recommendations:  Activity:  As tolerated. Diet:  Heart Healthy, Diabetic Diet. Tests:  Please remember that your Invega injection is next due on June 05, 2012. Other:  Please take all medications only as directed and keep all scheduled follow up appointments.  Please return to your local Emergency Room should you have any thoughts of harming yourself or others.  [name] 05/14/2012, 8:34 PM

## 2012-05-14 NOTE — Progress Notes (Signed)
Pt attended discharge planning group and actively participated in group.  SW provided pt with today's workbook.  Pt presents with calm mood and affect.  Pt rates depression and anxiety at a 2-3 today.  Pt denies SI/HI.  Treatment team feels pt is ready to d/c tomorrow.  SW secure pt's follow up at [facility] and contacted [facility]  Pt is able to return back to [facility] tomorrow with no issues.  SW is waiting to hear back from [facility] if they will be able to pick pt up to transport her back home tomorrow.  No further needs voiced by pt at this time.    [name], LCSWA 05/14/2012  12:26 PM    Per State Regulation 482.30 This Chart was reviewed for medical necessity with respect to the patient's Admission/Duration of stay.   [name]  05/14/2012  Next Review Date:  05/16/12

## 2012-05-14 NOTE — Progress Notes (Signed)
05/14/2012         Time: 0930      Group Topic/Focus: The focus of this group is on enhancing the patient's understanding of leisure, barriers to leisure, and the importance of engaging in positive leisure activities upon discharge for improved total health.  Participation Level: Active  Participation Quality: Appropriate  Affect: Blunted  Cognitive: Alert   Additional Comments: Patient remains flat, but brightens on approach. Patient appropriate during activity and reports she is hoping for discharge today.  [name] 05/14/2012 11:49 AM

## 2012-05-15 LAB — GLUCOSE, CAPILLARY
Glucose-Capillary: 102 mg/dL — ABNORMAL HIGH (ref 70–99)
Glucose-Capillary: 87 mg/dL (ref 70–99)

## 2012-05-15 MED ORDER — GLUCOSE BLOOD VI STRP: ORAL_STRIP | Status: DC

## 2012-05-15 MED ORDER — SERTRALINE HCL 50 MG PO TABS: 150.0000 mg | ORAL_TABLET | Freq: Every day | ORAL | Status: DC

## 2012-05-15 MED ORDER — SIMVASTATIN 40 MG PO TABS: 40.0000 mg | ORAL_TABLET | Freq: Every day | ORAL | Status: DC

## 2012-05-15 MED ORDER — VITAMIN E 180 MG (400 UNIT) PO CAPS: 400.0000 [IU] | ORAL_CAPSULE | Freq: Every day | ORAL | Status: AC

## 2012-05-15 MED ORDER — ASPIRIN 81 MG PO TBEC: 81.0000 mg | DELAYED_RELEASE_TABLET | Freq: Every day | ORAL | Status: AC

## 2012-05-15 MED ORDER — PALIPERIDONE PALMITATE 117 MG/0.75ML IM SUSP: 117.0000 mg | INTRAMUSCULAR | Status: DC

## 2012-05-15 MED ORDER — AMLODIPINE BESYLATE 10 MG PO TABS: 10.0000 mg | ORAL_TABLET | Freq: Every day | ORAL | Status: DC

## 2012-05-15 MED ORDER — GLIPIZIDE 10 MG PO TABS: 10.0000 mg | ORAL_TABLET | Freq: Two times a day (BID) | ORAL | Status: DC

## 2012-05-15 MED ORDER — LEVOTHYROXINE SODIUM 88 MCG PO TABS
88.0000 ug | ORAL_TABLET | Freq: Every day | ORAL | Status: AC
Start: 2012-05-15 — End: 2013-05-15

## 2012-05-15 MED ORDER — METFORMIN HCL 500 MG PO TABS: 500.0000 mg | ORAL_TABLET | Freq: Two times a day (BID) | ORAL | Status: DC

## 2012-05-15 MED ORDER — PANTOPRAZOLE SODIUM 40 MG PO TBEC: 40.0000 mg | DELAYED_RELEASE_TABLET | Freq: Every day | ORAL | Status: DC

## 2012-05-15 MED ORDER — BENZTROPINE MESYLATE 2 MG PO TABS: 2.0000 mg | ORAL_TABLET | ORAL | Status: DC

## 2012-05-15 MED ORDER — MELOXICAM 7.5 MG PO TABS: 7.5000 mg | ORAL_TABLET | ORAL | Status: AC

## 2012-05-15 MED ORDER — PALIPERIDONE ER 9 MG PO TB24: 9.0000 mg | ORAL_TABLET | Freq: Every day | ORAL | Status: DC

## 2012-05-15 MED ORDER — LINAGLIPTIN 5 MG PO TABS: 5.0000 mg | ORAL_TABLET | Freq: Every day | ORAL | Status: DC

## 2012-05-15 MED ORDER — TRAZODONE HCL 100 MG PO TABS: 200.0000 mg | ORAL_TABLET | Freq: Every day | ORAL | Status: DC

## 2012-05-15 MED ORDER — DSS 100 MG PO CAPS: 100.0000 mg | ORAL_CAPSULE | ORAL | Status: AC

## 2012-05-15 MED ORDER — ZOLPIDEM TARTRATE 5 MG PO TABS: 5.0000 mg | ORAL_TABLET | Freq: Every day | ORAL | Status: DC

## 2012-05-15 MED ORDER — POLYETHYLENE GLYCOL 3350 17 G PO PACK: 17.0000 g | PACK | Freq: Every day | ORAL | Status: AC

## 2012-05-15 MED ORDER — OXCARBAZEPINE 300 MG PO TABS: 300.0000 mg | ORAL_TABLET | Freq: Every day | ORAL | Status: DC

## 2012-05-15 NOTE — Progress Notes (Signed)
Pt observed in the dayroom watching TV.  Pt reports she is going back to her group home tomorrow.  She denies SI/HI.  She says the voices are down to a mumble now.  She voices no needs/concerns at this time.  Spent time just talking in general with the patient.  Support/encouragement given.  Safety maintained with q15 minute checks.

## 2012-05-15 NOTE — Tx Team (Signed)
Interdisciplinary Treatment Plan Update (Adult)  Date:  05/15/2012  Time Reviewed:  10:18 AM   Progress in Treatment: Attending groups: Yes Participating in groups:  Yes Taking medication as prescribed: Yes Tolerating medication:  Yes Family/Significant othe contact made:  Yes Patient understands diagnosis:  Yes Discussing patient identified problems/goals with staff:  Yes Medical problems stabilized or resolved:  Yes Denies suicidal/homicidal ideation: Yes Issues/concerns per patient self-inventory:  None identified Other: N/A  New problem(s) identified: None Identified  Reason for Continuation of Hospitalization: Stable to d/c  Interventions implemented related to continuation of hospitalization: Stable to d/c  Additional comments: N/A  Estimated length of stay: D/C today  Discharge Plan: Pt will follow up with [facility] for ACTT services and return to [facility].    New goal(s): N/A  Review of initial/current patient goals per problem list:    1. Goal(s): Reduce depressive and anxiety symptoms  Met: Yes Target date: by discharge  As evidenced by: Reducing depression from a 10 to a 3 as reported by pt. Pt rates depression at a 2 and anxiety at a 0 today.    2. Goal (s): Eliminate Suicidal Ideation/Homicidal Ideation  Met: Yes  Target date: by discharge  As evidenced by: Eliminate suicidal ideation/homicidal ideation. Pt continues to endorse SI/HI.  3. Goal(s): Reduce auditory hallucinations  Met: Yes Target date: by discharge  As evidenced by: Reduce voices to baseline, as reported by pt. Pt states that voices have decreased.  Attendees: Patient:  [name] 05/15/2012 10:45 AM   Family:     Physician:  Franchot Gallo, MD 05/15/2012 10:45 AM   Nursing:   Joslyn Devon, RN 05/15/2012 10:45 AM   Case Manager:  Reyes Ivan, LCSWA 05/15/2012 10:45 AM   Counselor:  Veto Kemps, MT-BC 05/15/2012 10:45 AM   Other: Barrie Folk, RN 05/15/2012 10:44 AM     Other:  Abbie Sons, RN 05/15/2012 11:08 AM   Other:     Other:      Scribe for Treatment Team:   Carmina Miller, 05/15/2012, 10:18 AM

## 2012-05-15 NOTE — Progress Notes (Signed)
[facility] Case Management Discharge Plan:  Will you be returning to the same living situation after discharge: Yes,  return to group home At discharge, do you have transportation home?:Yes,  [facility] will transport pt home Do you have the ability to pay for your medications:Yes,  access to meds  Interagency Information:     Release of information consent forms completed and in the chart;  Patient's signature needed at discharge.  Patient to Follow up at:  Follow-up Information    Follow up with [facility] on 05/16/2012. (Appointment scheduled at 11:00 am)    Contact information:   [address] [place], [place] [address] [phone]         Patient denies SI/HI:   Yes,  denies SI/HI today    Safety Planning and Suicide Prevention discussed:  Yes,  discussed with pt today  Barrier to discharge identified:No.  Summary and Recommendations: Pt reports feeling stable to d/c today.  Pt rates depression at a 0 and anxiety at a 2 today.  Pt denies SI/HI.  Pt states that she still hears voices but they have significantly decreased.  Pt will return to [facility] and [facility].  No recommendations from SW.  No further needs voiced by pt.  Pt stable to discharge.     [name] 05/15/2012, 11:08 AM

## 2012-05-15 NOTE — Progress Notes (Signed)
Patient discharged per MD order.  Patient left ambulatory with [facility] representative.  She received all her belongings; prescriptions and medications samples.  Discharge instructions were reviewed with her and she understood all instructions.  Patient denies all SI/HI/AVH.

## 2012-05-15 NOTE — Progress Notes (Signed)
Patient to be discharged back to Group Home [facility]) today.  She denies any SI/HI today.  She is still having auditory hallucinations, however, they are very limited.  [facility] will pick up at approximately 1330 today.  She has improved in her treatment.  She has been attending groups and participating in her treatment.  She interacts well with staff and peers alike.    Continue to monitor and safety checks completed every 15 minutes.  Initiate discharge instructions; review with patient and proceed with discharge.

## 2012-05-15 NOTE — Progress Notes (Signed)
Psychoeducational Group Note  Date:  05/15/2012 Time:  1100  Group Topic/Focus:  Rediscovering Joy:   The focus of this group is to explore various ways to relieve stress in a positive manner.  Participation Level:  Active  Participation Quality:  Appropriate  Affect:  Appropriate  Cognitive:  Appropriate  Insight:  Good  Engagement in Group:  Good  Additional Comments:  Pt shared positively during group this morning.  Kamali Sakata E 05/15/2012, 1:13 PM

## 2012-05-16 NOTE — Progress Notes (Signed)
Patient Discharge Instructions:  After Visit Summary (AVS):   Faxed to:  05/16/2012 Psychiatric Admission Assessment Note:   Faxed to:  05/16/2012 Suicide Risk Assessment - Discharge Assessment:   Faxed to:  05/16/2012 Faxed/Sent to the Next Level Care provider:  05/16/2012  Faxed to [facility] @ [phone]  Wandra Scot, 05/16/2012, 3:39 PM

## 2012-05-16 NOTE — Progress Notes (Signed)
[facility] Group Notes:  (Counselor/Nursing/MHT/Case Management/Adjunct)  05/16/2012 9:43 AM  Type of Therapy:  Group Therapy  9;30, Music Therapy 1:15  05/15/12  Participation Level:  Active  Participation Quality:  Attentive and Sharing  Affect:  Blunted  Cognitive:  Oriented  Insight:  Limited  Engagement in Group:  Good  Engagement in Therapy:  Good  Modes of Intervention:  Education, Problem-solving, Support and Music  Summary of Progress/Problems: Patient stated that she thought she had misdiagnosed and that she really thinks her problems are caused by the devil. Encouraged her that no matter what the diagnosis, she had found that her medications do work and they make her not hear voices. Encouraged her strongly to keep taking her medications. Patient was in agreement. Looking forward to going home.   [name] 05/16/2012, 9:43 AM

## 2012-06-01 NOTE — Discharge Summary (Signed)
Physician Discharge Summary Note  Patient:  [name] is an 56 y.o., female MRN:  [id number] DOB:  17-Sep-1956 Patient phone:  [phone] (home)  Patient address:   [address] [place] [address]   Date of Admission:  05/03/2012 Date of Discharge: 05/15/2012  Discharge Diagnoses: Principal Problem:  *Schizophrenia, paranoid type Active Problems:  Chloride, decreased level  Passive suicidal ideations  Suicidal ideations  Axis Diagnosis:  AXIS I: Schizophrenia - Paranoid Type.  AXIS II: Deferred.  AXIS III: 1. Diabetes Mellitus.  2. Gastroesophageal Reflux Disease.  3. Hyperlipidemia.  4. Chronic Back Pain.  5. Hypothyroidism.  6. Hypertension.  AXIS IV: Chronic Mental Illness. Chronic Non-psychiatric Medical Issues.  AXIS V: GAF at time of admission approximately 35. GAF at time of discharge approximately 50.   Level of Care:   Inpatient hospitalization.   Reason For Admission: This patient presented to the ED from her group home endorsing suicidal ideations, homicidal ideations, with plans to cut herself and others. She has had previous admissions around this time as it is the anniversary of her brother's death and her mother's birthday. She has a past psychiatric history of schizoaffective disorder and reports seeing things and hearing voices in her roommate's closet but she can't make them out.   Hospital Course:   The patient attended treatment team meeting this am and met with treatment team members. The patient's symptoms, treatment plan and response to treatment was discussed. The patient endorsed that their symptoms have improved. The patient also stated that they felt stable for discharge.  They reported that from this hospital stay they had learned many coping skills.  In other to maintain their psychiatric stability, they will continue psychiatric care on an outpatient basis. They will follow-up as outlined below.  In addition they were instructed  to take  all your medications as prescribed by their mental healthcare provider and to report any adverse effects and or reactions from your medicines to their outpatient provider promptly.  The patient is also instructed and cautioned to not engage in alcohol and or illegal drug use while on prescription medicines.  In the event of worsening symptoms the patient is instructed to call the crisis hotline, 911 and or go to the nearest ED for appropriate evaluation and treatment of symptoms.   Also while a patient in this hospital, the patient received medication management for his psychiatric symptoms. They were ordered and received as outlined below:    Medication List     As of 06/01/2012 11:26 PM    STOP taking these medications         citalopram 40 MG tablet   Commonly known as: CELEXA      divalproex 500 MG 24 hr tablet   Commonly known as: DEPAKOTE ER      docusate sodium 100 MG capsule   Commonly known as: COLACE      lisinopril-hydrochlorothiazide 10-12.5 MG per tablet   Commonly known as: PRINZIDE,ZESTORETIC      omeprazole 20 MG capsule   Commonly known as: PRILOSEC   Replaced by: pantoprazole 40 MG tablet      polyethylene glycol packet   Commonly known as: MIRALAX / GLYCOLAX      sitaGLIPtin 50 MG tablet   Commonly known as: JANUVIA   Replaced by: linagliptin 5 MG Tabs tablet      TAKE these medications      Indication    amLODipine 10 MG tablet   Commonly known as:  NORVASC   Take 1 tablet (10 mg total) by mouth daily. For blood pressure.       aspirin 81 MG EC tablet   Take 1 tablet (81 mg total) by mouth daily. For blood thinner.       benztropine 2 MG tablet   Commonly known as: COGENTIN   Take 1 tablet (2 mg total) by mouth 2 (two) times daily in the am and at bedtime.. For EPS.       glipiZIDE 10 MG tablet   Commonly known as: GLUCOTROL   Take 1 tablet (10 mg total) by mouth 2 (two) times daily before a meal. For blood sugar control.       glucose blood test  strip   To use for blood sugar monitoring.       levothyroxine 88 MCG tablet   Commonly known as: SYNTHROID, LEVOTHROID   Take 1 tablet (88 mcg total) by mouth daily before breakfast. For hypothyroidism.       linagliptin 5 MG Tabs tablet   Commonly known as: TRADJENTA   Take 1 tablet (5 mg total) by mouth daily. For blood sugar control.       meloxicam 7.5 MG tablet   Commonly known as: MOBIC   Take 1 tablet (7.5 mg total) by mouth 2 (two) times daily in the am and at bedtime.. For pain.       metFORMIN 500 MG tablet   Commonly known as: GLUCOPHAGE   Take 1 tablet (500 mg total) by mouth 2 (two) times daily with a meal. For blood sugar control.       Oxcarbazepine 300 MG tablet   Commonly known as: TRILEPTAL   Take 1 tablet (300 mg total) by mouth at bedtime. For mood stabilization.       paliperidone 9 MG 24 hr tablet   Commonly known as: INVEGA   Take 1 tablet (9 mg total) by mouth at bedtime. For psychosis.       Paliperidone Palmitate 117 MG/0.75ML Susp   Inject 117 mg into the muscle every 30 (thirty) days. For psychosis.  The next injection is due on June 05, 2012.       pantoprazole 40 MG tablet   Commonly known as: PROTONIX   Take 1 tablet (40 mg total) by mouth at bedtime. For GERD.       sertraline 50 MG tablet   Commonly known as: ZOLOFT   Take 3 tablets (150 mg total) by mouth daily. For depression and anxiety.       simvastatin 40 MG tablet   Commonly known as: ZOCOR   Take 1 tablet (40 mg total) by mouth at bedtime. For hyperlipidemia.       traZODone 100 MG tablet   Commonly known as: DESYREL   Take 2 tablets (200 mg total) by mouth at bedtime. For sleep.       vitamin E 400 UNIT capsule   Take 1 capsule (400 Units total) by mouth daily. As a nutritional supplement.       zolpidem 5 MG tablet   Commonly known as: AMBIEN   Take 1 tablet (5 mg total) by mouth at bedtime. For sleep.        They were also enrolled in group counseling sessions  and activities in which they participated actively.       Follow-up Information    Follow up with [facility] on 05/16/2012. (Appointment scheduled at 11:00 am)    Contact information:  [address] [place], [place] [address] [phone]        Upon discharge, patient adamantly denies suicidal, homicidal ideations, auditory, visual hallucinations and or delusional thinking. [facility] with all personal belongings via personal transportation in no apparent distress.  Consults:  Please see the patient's electronic medical record for more details.   Significant Diagnostic Studies:  Please see the patient's electronic medical record for more details.   Discharge Vitals:   Blood pressure 145/86, pulse 116, temperature 97.1 F (36.2 C), temperature source Oral, resp. rate 18, height 5\' 5"  (1.651 m), weight 112.492 kg (248 lb)..  Mental Status Exam: Demographic factors:  Caucasian;Low socioeconomic status;Unemployed  Current Mental Status Per Nursing Assessment::  On Admission: Self-harm thoughts  At Discharge: Time was spent today discussing with the patient her current symptoms. The patient states that she is sleeping very well and reports a good appetite. The patient reports some mild feelings of sadness, anhedonia and depressed mood but adamantly denies any suicidal or homicidal ideations. She adamantly denies any visual hallucinations or delusional thinking and states her auditory hallucinations are much improved to where they are now a "slight mumble" which she cannot make out. She also reports mild anxiety symptoms and denies any feelings of hopelessness. The patient also denies any medications related side effects.  The patient states that she feels ready for discharge and would like to arrange discharge in the morning if possible. This will be ordered and the patient's Case Manager will arrange follow up for the patient as well as transportation to return to her group home.  She will be discharged in the morning to outpatient follow up if her symptoms remain stable.  Current Mental Status Per Physician:  Diagnosis:  Axis I: Schizophrenia - Paranoid Type.  The patient was seen today and reports the following:  ADL's: Intact.  Sleep: The patient reports to sleeping very well at night with the medication Ambien.  Appetite: The patient reports that her appetite is good.  Mild>(1-10) >Severe  Hopelessness (1-10): 0  Depression (1-10): 3  Anxiety (1-10): 3  Suicidal Ideation: The patient adamantly denies any suicidal ideations today.  Plan: No  Intent: No  Means: No  Homicidal Ideation: The patient adamantly denies any homicidal ideations today.  Plan: No  Intent: No.  Means: No  General Appearance/Behavior: The patient remained friendly and cooperative today with this provider.  Eye Contact: Good.  Speech: Appropriate in rate and volume with no pressuring noted today.  Motor Behavior: wnl.  Level of Consciousness: Alert and Oriented x 3.  Mental Status: Alert and Oriented x 3.  Mood: Appears mildly depressed.  Affect: Mildly constricted.  Anxiety Level: Mild anxiety reported today.  Thought Process: Auditory hallucinations present but much improved to where they are now "a "slight mumble" which she cannot make out.  Thought Content: The patient reports auditory hallucinations present but much improved to where they are now "a "slight mumble" which she cannot make out. She denies any visual hallucinations or delusional thinking today.  Perception: Auditory hallucinations present but much improved to where they are now "a "slight mumble" which she cannot make out.  Judgment: Fair to Good.  Insight: Fair to Good.  Cognition: Oriented to person, place and time.  Loss Factors:  Decline in physical health;Financial problems / change in socioeconomic status  Historical Factors:  Prior suicide attempts;Anniversary of important loss;Impulsivity. Long history of  mental illness.  Risk Reduction Factors:  Good support from Group Home.  The patient is followed by an ACT team. Good access to healthcare.  Continued Clinical Symptoms:  Depression: Anhedonia  Chronic Pain  Previous Psychiatric Diagnoses and Treatments  Medical Diagnoses and Treatments/Surgeries  Discharge Diagnoses:  AXIS I: Schizophrenia - Paranoid Type.  AXIS II: Deferred.  AXIS III: 1. Diabetes Mellitus.  2. Gastroesophageal Reflux Disease.  3. Hyperlipidemia.  4. Chronic Back Pain.  5. Hypothyroidism.  6. Hypertension.  AXIS IV: Chronic Mental Illness. Chronic Non-psychiatric Medical Issues.  AXIS V: GAF at time of admission approximately 35. GAF at time of discharge approximately 50.  Cognitive Features That Contribute To Risk:  None Noted.  Current Medications:  .  amLODipine  10 mg  Oral  Daily   .  aspirin EC  81 mg  Oral  Daily   .  benztropine  2 mg  Oral  BH-qamhs   .  docusate sodium  100 mg  Oral  BH-qamhs   .  glipiZIDE  10 mg  Oral  BID AC   .  insulin aspart  0-20 Units  Subcutaneous  TID WC   .  levothyroxine  88 mcg  Oral  QAC breakfast   .  linagliptin  5 mg  Oral  Daily   .  meloxicam  7.5 mg  Oral  BID   .  metFORMIN  500 mg  Oral  BID WC   .  Oxcarbazepine  300 mg  Oral  QHS   .  paliperidone  9 mg  Oral  QHS   .  Paliperidone Palmitate  117 mg  Intramuscular  Q30 days   .  pantoprazole  40 mg  Oral  QHS   .  polyethylene glycol  17 g  Oral  Daily   .  sertraline  150 mg  Oral  Daily   .  simvastatin  40 mg  Oral  QHS   .  traZODone  200 mg  Oral  QHS   .  vitamin E  400 Units  Oral  Daily   .  zolpidem  5 mg  Oral  QHS   Physical Findings:  AIMS: Facial and Oral Movements  Muscles of Facial Expression: None, normal  Lips and Perioral Area: None, normal  Jaw: None, normal  Tongue: None, normal,Extremity Movements  Upper (arms, wrists, hands, fingers): None, normal  Lower (legs, knees, ankles, toes): None, normal, Trunk Movements  Neck,  shoulders, hips: None, normal, Overall Severity  Severity of abnormal movements (highest score from questions above): None, normal  Incapacitation due to abnormal movements: None, normal  Patient's awareness of abnormal movements (rate only patient's report): No Awareness, Dental Status  Current problems with teeth and/or dentures?: No  Does patient usually wear dentures?: No  CIWA: CIWA-Ar Total: 5  COWS: COWS Total Score: 1  Review of Systems:  Neurological: The patient denies any headaches today. She denies any seizures or dizziness.  G.I.: The patient denies any constipation today or G.I. Upset.  Musculoskeletal: The patient denies any muscle or skeletal difficulties.  Time was spent today discussing with the patient her current symptoms. The patient states that she is sleeping very well and reports a good appetite. The patient reports some mild feelings of sadness, anhedonia and depressed mood but adamantly denies any suicidal or homicidal ideations. She adamantly denies any visual hallucinations or delusional thinking and states her auditory hallucinations are much improved to where they are now a "slight mumble" which she cannot make out. She also reports  mild anxiety symptoms and denies any feelings of hopelessness. The patient also denies any medications related side effects.  The patient states that she feels ready for discharge and would like to arrange discharge in the morning if possible. This will be ordered and the patient's Case Manager will arrange follow up for the patient as well as transportation to return to her group home. She will be discharged in the morning to outpatient follow up if her symptoms remain stable.  Treatment Plan Summary:  1. Daily contact with patient to assess and evaluate symptoms and progress in treatment.  2. Medication management  3. The patient will deny suicidal ideations or homicidal ideations for 48 hours prior to discharge and have a depression and  anxiety rating of 3 or less. The patient will also deny any auditory or visual hallucinations or delusional thinking.  4. The patient will deny any symptoms of substance withdrawal at time of discharge.  Plan:  1. Will continue the medication Invega at 9 mgs po qhs to further address her psychotic symptoms.  2. Will continue the medication Paliperidone Palmitate 115 mgs IM q 30 days with this injection next due on June 05, 2012.  3. Will continue the medication Trileptal at 300 mgs po qhs for mood stabilization.  4. Will continue the medication Cogentin at 2 mgs po q am and hs for EPS.  5. Will continue the medication Zoloft at 150 mgs po q am for depression and anxiety.  6. Will continue the patient on the medication Trazodone at 150 mgs po qhs for sleep.  7. Will continue the patient on the medication Ambien at 5 mgs po qhs for sleep.  8. Will continue the patient on her non-psychiatric medications as listed above.  9. Laboratory studies reviewed.  10. Will continue to monitor.  11. The patient will be discharged in the morning as requested to outpatient follow up.  Suicide Risk:  Minimal: No identifiable suicidal ideation. Patients presenting with no risk factors but with morbid ruminations; may be classified as minimal risk based on the severity of the depressive symptoms  Plan Of Care/Follow-up recommendations:  Activity: As tolerated.  Diet: Heart Healthy, Diabetic Diet.  Tests: Please remember that your Invega injection is next due on June 05, 2012.  Other: Please take all medications only as directed and keep all scheduled follow up appointments. Please return to your local Emergency Room should you have any thoughts of harming yourself or others.  Discharge destination:  Home  Is patient on multiple antipsychotic therapies at discharge:  No  Has Patient had three or more failed trials of antipsychotic monotherapy by history: N/A Recommended Plan for Multiple Antipsychotic  Therapies: N/A Discharge Orders    Future Orders Please Complete By Expires   Diet - low sodium heart healthy      Increase activity slowly      Discharge instructions      Comments:   Please take all medications only as directed and keep all scheduled follow up appointments.  Please return to your local Emergency Room should you have any thoughts of harming yourself or others.       Medication List     As of 06/01/2012 11:26 PM    STOP taking these medications         citalopram 40 MG tablet   Commonly known as: CELEXA      divalproex 500 MG 24 hr tablet   Commonly known as: DEPAKOTE ER  docusate sodium 100 MG capsule   Commonly known as: COLACE      lisinopril-hydrochlorothiazide 10-12.5 MG per tablet   Commonly known as: PRINZIDE,ZESTORETIC      omeprazole 20 MG capsule   Commonly known as: PRILOSEC   Replaced by: pantoprazole 40 MG tablet      polyethylene glycol packet   Commonly known as: MIRALAX / GLYCOLAX      sitaGLIPtin 50 MG tablet   Commonly known as: JANUVIA   Replaced by: linagliptin 5 MG Tabs tablet      TAKE these medications      Indication    amLODipine 10 MG tablet   Commonly known as: NORVASC   Take 1 tablet (10 mg total) by mouth daily. For blood pressure.       aspirin 81 MG EC tablet   Take 1 tablet (81 mg total) by mouth daily. For blood thinner.       benztropine 2 MG tablet   Commonly known as: COGENTIN   Take 1 tablet (2 mg total) by mouth 2 (two) times daily in the am and at bedtime.. For EPS.       glipiZIDE 10 MG tablet   Commonly known as: GLUCOTROL   Take 1 tablet (10 mg total) by mouth 2 (two) times daily before a meal. For blood sugar control.       glucose blood test strip   To use for blood sugar monitoring.       levothyroxine 88 MCG tablet   Commonly known as: SYNTHROID, LEVOTHROID   Take 1 tablet (88 mcg total) by mouth daily before breakfast. For hypothyroidism.       linagliptin 5 MG Tabs tablet   Commonly  known as: TRADJENTA   Take 1 tablet (5 mg total) by mouth daily. For blood sugar control.       meloxicam 7.5 MG tablet   Commonly known as: MOBIC   Take 1 tablet (7.5 mg total) by mouth 2 (two) times daily in the am and at bedtime.. For pain.       metFORMIN 500 MG tablet   Commonly known as: GLUCOPHAGE   Take 1 tablet (500 mg total) by mouth 2 (two) times daily with a meal. For blood sugar control.       Oxcarbazepine 300 MG tablet   Commonly known as: TRILEPTAL   Take 1 tablet (300 mg total) by mouth at bedtime. For mood stabilization.       paliperidone 9 MG 24 hr tablet   Commonly known as: INVEGA   Take 1 tablet (9 mg total) by mouth at bedtime. For psychosis.       Paliperidone Palmitate 117 MG/0.75ML Susp   Inject 117 mg into the muscle every 30 (thirty) days. For psychosis.  The next injection is due on June 05, 2012.       pantoprazole 40 MG tablet   Commonly known as: PROTONIX   Take 1 tablet (40 mg total) by mouth at bedtime. For GERD.       sertraline 50 MG tablet   Commonly known as: ZOLOFT   Take 3 tablets (150 mg total) by mouth daily. For depression and anxiety.       simvastatin 40 MG tablet   Commonly known as: ZOCOR   Take 1 tablet (40 mg total) by mouth at bedtime. For hyperlipidemia.       traZODone 100 MG tablet   Commonly known as: DESYREL   Take 2 tablets (200  mg total) by mouth at bedtime. For sleep.       vitamin E 400 UNIT capsule   Take 1 capsule (400 Units total) by mouth daily. As a nutritional supplement.       zolpidem 5 MG tablet   Commonly known as: AMBIEN   Take 1 tablet (5 mg total) by mouth at bedtime. For sleep.            Follow-up Information    Follow up with [facility] on 05/16/2012. (Appointment scheduled at 11:00 am)    Contact information:   [address] [place], [place] [address] [phone]        Follow-up recommendations:   Activities: Resume typical activities Diet: Resume typical diet Other:  Follow up with outpatient provider and report any side effects to out patient prescriber.  Comments:  Take all your medications as prescribed by your mental healthcare provider. Report any adverse effects and or reactions from your medicines to your outpatient provider promptly. Patient is instructed and cautioned to not engage in alcohol and or illegal drug use while on prescription medicines. In the event of worsening symptoms, patient is instructed to call the crisis hotline, 911 and or go to the nearest ED for appropriate evaluation and treatment of symptoms.  Signed: Franchot Gallo 06/01/2012 11:26 PM

## 2012-06-12 ENCOUNTER — Other Ambulatory Visit: Payer: Self-pay | Admitting: Internal Medicine

## 2012-06-12 DIAGNOSIS — R1011 Right upper quadrant pain: Secondary | ICD-10-CM

## 2012-06-17 ENCOUNTER — Ambulatory Visit
Admission: RE | Admit: 2012-06-17 | Discharge: 2012-06-17 | Disposition: A | Discharge status: Home / Self Care | Payer: Medicare Other | Source: Ambulatory Visit | Attending: Internal Medicine | Admitting: Internal Medicine

## 2012-06-17 ENCOUNTER — Other Ambulatory Visit: Payer: Self-pay | Admitting: Family Medicine

## 2012-06-17 DIAGNOSIS — R1011 Right upper quadrant pain: Secondary | ICD-10-CM

## 2012-09-04 ENCOUNTER — Other Ambulatory Visit: Payer: Self-pay | Admitting: Internal Medicine

## 2012-09-04 DIAGNOSIS — Z1231 Encounter for screening mammogram for malignant neoplasm of breast: Secondary | ICD-10-CM

## 2012-10-15 ENCOUNTER — Ambulatory Visit: Payer: Self-pay

## 2012-11-02 ENCOUNTER — Emergency Department (HOSPITAL_COMMUNITY)
Admission: EM | Admit: 2012-11-02 | Discharge: 2012-11-02 | Disposition: A | Discharge status: Home / Self Care | Payer: Medicare Other | Attending: Emergency Medicine | Admitting: Emergency Medicine

## 2012-11-02 ENCOUNTER — Encounter (HOSPITAL_COMMUNITY): Payer: Self-pay

## 2012-11-02 DIAGNOSIS — R45850 Homicidal ideations: Secondary | ICD-10-CM

## 2012-11-02 DIAGNOSIS — E119 Type 2 diabetes mellitus without complications: Secondary | ICD-10-CM | POA: Insufficient documentation

## 2012-11-02 DIAGNOSIS — F319 Bipolar disorder, unspecified: Secondary | ICD-10-CM

## 2012-11-02 DIAGNOSIS — Y9210 Unspecified residential institution as the place of occurrence of the external cause: Secondary | ICD-10-CM | POA: Insufficient documentation

## 2012-11-02 DIAGNOSIS — Z7982 Long term (current) use of aspirin: Secondary | ICD-10-CM | POA: Insufficient documentation

## 2012-11-02 DIAGNOSIS — K219 Gastro-esophageal reflux disease without esophagitis: Secondary | ICD-10-CM | POA: Insufficient documentation

## 2012-11-02 DIAGNOSIS — F259 Schizoaffective disorder, unspecified: Secondary | ICD-10-CM | POA: Insufficient documentation

## 2012-11-02 DIAGNOSIS — Y939 Activity, unspecified: Secondary | ICD-10-CM | POA: Insufficient documentation

## 2012-11-02 DIAGNOSIS — Z87891 Personal history of nicotine dependence: Secondary | ICD-10-CM | POA: Insufficient documentation

## 2012-11-02 DIAGNOSIS — X789XXA Intentional self-harm by unspecified sharp object, initial encounter: Secondary | ICD-10-CM | POA: Insufficient documentation

## 2012-11-02 DIAGNOSIS — R45851 Suicidal ideations: Secondary | ICD-10-CM

## 2012-11-02 DIAGNOSIS — I10 Essential (primary) hypertension: Secondary | ICD-10-CM | POA: Insufficient documentation

## 2012-11-02 DIAGNOSIS — S41112A Laceration without foreign body of left upper arm, initial encounter: Secondary | ICD-10-CM

## 2012-11-02 DIAGNOSIS — Z79899 Other long term (current) drug therapy: Secondary | ICD-10-CM | POA: Insufficient documentation

## 2012-11-02 DIAGNOSIS — E785 Hyperlipidemia, unspecified: Secondary | ICD-10-CM | POA: Insufficient documentation

## 2012-11-02 DIAGNOSIS — S41109A Unspecified open wound of unspecified upper arm, initial encounter: Secondary | ICD-10-CM | POA: Insufficient documentation

## 2012-11-02 DIAGNOSIS — F209 Schizophrenia, unspecified: Secondary | ICD-10-CM

## 2012-11-02 DIAGNOSIS — W268XXA Contact with other sharp object(s), not elsewhere classified, initial encounter: Secondary | ICD-10-CM | POA: Insufficient documentation

## 2012-11-02 DIAGNOSIS — F603 Borderline personality disorder: Secondary | ICD-10-CM | POA: Insufficient documentation

## 2012-11-02 LAB — COMPREHENSIVE METABOLIC PANEL
ALT: 9 U/L (ref 0–35)
AST: 12 U/L (ref 0–37)
Albumin: 4.2 g/dL (ref 3.5–5.2)
Alkaline Phosphatase: 61 U/L (ref 39–117)
BUN: 16 mg/dL (ref 6–23)
CO2: 25 mEq/L (ref 19–32)
Calcium: 9.4 mg/dL (ref 8.4–10.5)
Chloride: 92 mEq/L — ABNORMAL LOW (ref 96–112)
Creatinine, Ser: 1.02 mg/dL (ref 0.50–1.10)
GFR calc Af Amer: 71 mL/min — ABNORMAL LOW (ref >90)
GFR calc non Af Amer: 61 mL/min — ABNORMAL LOW (ref >90)
Glucose, Bld: 149 mg/dL — ABNORMAL HIGH (ref 70–99)
Potassium: 3.9 mEq/L (ref 3.5–5.1)
Sodium: 130 mEq/L — ABNORMAL LOW (ref 135–145)
Total Bilirubin: 0.2 mg/dL — ABNORMAL LOW (ref 0.3–1.2)
Total Protein: 8.3 g/dL (ref 6.0–8.3)

## 2012-11-02 LAB — GLUCOSE, CAPILLARY: Glucose-Capillary: 91 mg/dL (ref 70–99)

## 2012-11-02 LAB — ETHANOL: Alcohol, Ethyl (B): 14 mg/dL — ABNORMAL HIGH (ref 0–11)

## 2012-11-02 LAB — CBC
HCT: 36.1 % (ref 36.0–46.0)
Hemoglobin: 12.3 g/dL (ref 12.0–15.0)
MCH: 28.1 pg (ref 26.0–34.0)
MCHC: 34.1 g/dL (ref 30.0–36.0)
MCV: 82.4 fL (ref 78.0–100.0)
Platelets: 372 10*3/uL (ref 150–400)
RBC: 4.38 MIL/uL (ref 3.87–5.11)
RDW: 14.7 % (ref 11.5–15.5)
WBC: 13.5 10*3/uL — ABNORMAL HIGH (ref 4.0–10.5)

## 2012-11-02 LAB — RAPID URINE DRUG SCREEN, HOSP PERFORMED
Amphetamines: NOT DETECTED
Barbiturates: NOT DETECTED
Benzodiazepines: NOT DETECTED
Cocaine: NOT DETECTED
Opiates: NOT DETECTED
Tetrahydrocannabinol: NOT DETECTED

## 2012-11-02 MED ORDER — LINAGLIPTIN 5 MG PO TABS
5.0000 mg | ORAL_TABLET | Freq: Every day | ORAL | Status: DC
  Administered 2012-11-02: 5 mg via ORAL
  Filled 2012-11-02: qty 1

## 2012-11-02 MED ORDER — IBUPROFEN 600 MG PO TABS: 600.0000 mg | ORAL_TABLET | Freq: Three times a day (TID) | ORAL | Status: DC | PRN

## 2012-11-02 MED ORDER — ASPIRIN EC 81 MG PO TBEC
81.0000 mg | DELAYED_RELEASE_TABLET | Freq: Every day | ORAL | Status: DC
  Administered 2012-11-02: 81 mg via ORAL
  Filled 2012-11-02: qty 1

## 2012-11-02 MED ORDER — SERTRALINE HCL 50 MG PO TABS
150.0000 mg | ORAL_TABLET | Freq: Every day | ORAL | Status: DC
  Administered 2012-11-02: 150 mg via ORAL
  Filled 2012-11-02: qty 3

## 2012-11-02 MED ORDER — PALIPERIDONE ER 6 MG PO TB24
9.0000 mg | ORAL_TABLET | Freq: Every day | ORAL | Status: DC
  Filled 2012-11-02: qty 1

## 2012-11-02 MED ORDER — LEVOTHYROXINE SODIUM 88 MCG PO TABS
88.0000 ug | ORAL_TABLET | Freq: Every day | ORAL | Status: DC
  Administered 2012-11-02: 88 ug via ORAL
  Filled 2012-11-02 (×2): qty 1

## 2012-11-02 MED ORDER — SIMVASTATIN 40 MG PO TABS
40.0000 mg | ORAL_TABLET | Freq: Every day | ORAL | Status: DC
  Filled 2012-11-02: qty 1

## 2012-11-02 MED ORDER — METFORMIN HCL 500 MG PO TABS
500.0000 mg | ORAL_TABLET | Freq: Two times a day (BID) | ORAL | Status: DC
  Administered 2012-11-02: 500 mg via ORAL
  Filled 2012-11-02 (×3): qty 1

## 2012-11-02 MED ORDER — MELOXICAM 7.5 MG PO TABS
7.5000 mg | ORAL_TABLET | ORAL | Status: DC
  Administered 2012-11-02: 7.5 mg via ORAL
  Filled 2012-11-02 (×3): qty 1

## 2012-11-02 MED ORDER — NICOTINE 21 MG/24HR TD PT24
21.0000 mg | MEDICATED_PATCH | Freq: Every day | TRANSDERMAL | Status: DC
  Filled 2012-11-02: qty 1

## 2012-11-02 MED ORDER — TRAZODONE HCL 100 MG PO TABS: 200.0000 mg | ORAL_TABLET | Freq: Every day | ORAL | Status: DC

## 2012-11-02 MED ORDER — GLIPIZIDE 10 MG PO TABS
10.0000 mg | ORAL_TABLET | Freq: Two times a day (BID) | ORAL | Status: DC
  Administered 2012-11-02: 10 mg via ORAL
  Filled 2012-11-02 (×3): qty 1

## 2012-11-02 MED ORDER — ONDANSETRON HCL 4 MG PO TABS: 4.0000 mg | ORAL_TABLET | Freq: Three times a day (TID) | ORAL | Status: DC | PRN

## 2012-11-02 MED ORDER — ALUM & MAG HYDROXIDE-SIMETH 200-200-20 MG/5ML PO SUSP: 30.0000 mL | ORAL | Status: DC | PRN

## 2012-11-02 MED ORDER — AMLODIPINE BESYLATE 10 MG PO TABS
10.0000 mg | ORAL_TABLET | Freq: Every day | ORAL | Status: DC
  Administered 2012-11-02: 10 mg via ORAL
  Filled 2012-11-02: qty 1

## 2012-11-02 MED ORDER — BENZTROPINE MESYLATE 1 MG PO TABS
2.0000 mg | ORAL_TABLET | ORAL | Status: DC
  Administered 2012-11-02: 2 mg via ORAL
  Filled 2012-11-02: qty 2

## 2012-11-02 MED ORDER — ACETAMINOPHEN 325 MG PO TABS: 650.0000 mg | ORAL_TABLET | ORAL | Status: DC | PRN

## 2012-11-02 MED ORDER — PANTOPRAZOLE SODIUM 40 MG PO TBEC: 40.0000 mg | DELAYED_RELEASE_TABLET | Freq: Every day | ORAL | Status: DC

## 2012-11-02 MED ORDER — ATORVASTATIN CALCIUM 20 MG PO TABS
20.0000 mg | ORAL_TABLET | Freq: Every day | ORAL | Status: DC
Start: 2012-11-02 — End: 2012-11-02
  Filled 2012-11-02: qty 1

## 2012-11-02 MED ORDER — OXCARBAZEPINE 300 MG PO TABS
300.0000 mg | ORAL_TABLET | Freq: Every day | ORAL | Status: DC
  Filled 2012-11-02: qty 1

## 2012-11-02 MED ORDER — ZOLPIDEM TARTRATE 5 MG PO TABS: 5.0000 mg | ORAL_TABLET | Freq: Every evening | ORAL | Status: DC | PRN

## 2012-11-02 NOTE — ED Notes (Signed)
OK to dc home per Dr. Henderson Cloud

## 2012-11-02 NOTE — ED Provider Notes (Signed)
Medical screening examination/treatment/procedure(s) were performed by non-physician practitioner and as supervising physician I was immediately available for consultation/collaboration.  Olivia Mackie, MD 11/02/12 [phone]
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
## 2012-11-02 NOTE — ED Notes (Signed)
Up to the desk to talk w/ councilor from [facility] Herbert Seta).
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
## 2012-11-02 NOTE — ED Notes (Signed)
Pt reports that Sherre Scarlet is [facility] administrator [phone]

## 2012-11-02 NOTE — ED Notes (Signed)
Mr. Jac Canavan contacted and will be here in approx 30 mins to pick the pt up

## 2012-11-02 NOTE — ED Notes (Signed)
Pt reports that she does not want to be admitted and wants to go back home.

## 2012-11-02 NOTE — ED Provider Notes (Signed)
Telepsych saw patient. Dr. Rob Bunting feels that she is not suicidal or homicidal and stable to d/c back to group home. Medically stable otherwise.   Richardean Canal, MD 11/02/12 [phone]

## 2012-11-02 NOTE — ED Notes (Signed)
telepsych in progress 

## 2012-11-02 NOTE — ED Notes (Signed)
[name] (Group Home) called and will pick the pt up to take her back to the group home--[phone].
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
## 2012-11-02 NOTE — Progress Notes (Signed)
Recommend Tele Psych to make recommendations.  Pt reports voices are a base line for her and she wants to leave ED.

## 2012-11-02 NOTE — ED Notes (Addendum)
Pt dc'd writen dc instructions.  Pt encouraged to let staff at group home know if she has thoughts of harming herself or others, pt verbalized understanding.  Pt is aware that Herbert Seta will see her tomorrow.  Pt ambulatory to dc window w/ security.  Belongings returned after leaving the window.  [facility] is here to pick her up.

## 2012-11-02 NOTE — ED Provider Notes (Signed)
History     CSN: [id number]  Arrival date & time 11/02/12  0056   First MD Initiated Contact with Patient 11/02/12 0120      Chief Complaint  Patient presents with  . Medical Clearance    (Consider location/radiation/quality/duration/timing/severity/associated sxs/prior treatment) HPI  Pt presents to the ED from a group home with complaints of HI and SI with attempt at self harm by cutting herself multiple times on left arm. Her care taker noticed a bloody wash cloth over her armShe has multiple different ideas of SI, cutting or jumping out of a moving vehicle. She says that she wants to kill other people and imagines herself slitting peoples throats. Does not have access to guns or knifes that she can think of. She does not know why she has these thoughts but is in general tired of living.   PT has history of schizophrenia, Bipolar disorder, borderline personality disorder, schizoeffective disorder, DM, chronic pain, GERD, HTN. She has a hx of previous suicide attempt. She is on multiple medications, which are listed below.     Past Medical History  Diagnosis Date  . Diabetes mellitus   . GERD (gastroesophageal reflux disease)   . Borderline personality disorder   . Schizoaffective disorder   . Depression   . Hyperlipidemia   . Chronic back pain   . Hypothyroidism   . Schizophrenia   . Schizoaffective disorder 04/29/2012  . HTN (hypertension) 04/29/2012  . Bipolar affective disorder 04/29/2012  . Diabetes mellitus 04/29/2012    Past Surgical History  Procedure Laterality Date  . Appendectomy    . Knee surgery      right  . Cholecystectomy      History reviewed. No pertinent family history.  History  Substance Use Topics  . Smoking status: Former Games developer  . Smokeless tobacco: Never Used  . Alcohol Use: No    OB History   Grav Para Term Preterm Abortions TAB SAB Ect Mult Living                  Review of Systems Skin: + lacerations Psych: + SI/HI  Level 5  caveat pt  psychotic   Allergies  Review of patient's allergies indicates no known allergies.  Home Medications   Current Outpatient Rx  Name  Route  Sig  Dispense  Refill  . amLODipine (NORVASC) 10 MG tablet   Oral   Take 1 tablet (10 mg total) by mouth daily. For blood pressure.   30 tablet   0   . aspirin EC 81 MG EC tablet   Oral   Take 1 tablet (81 mg total) by mouth daily. For blood thinner.   30 tablet   0   . benztropine (COGENTIN) 2 MG tablet   Oral   Take 1 tablet (2 mg total) by mouth 2 (two) times daily in the am and at bedtime.. For EPS.   60 tablet   0   . glipiZIDE (GLUCOTROL) 10 MG tablet   Oral   Take 1 tablet (10 mg total) by mouth 2 (two) times daily before a meal. For blood sugar control.   60 tablet   0   . glucose blood test strip      To use for blood sugar monitoring.   100 each   0   . levothyroxine (SYNTHROID, LEVOTHROID) 88 MCG tablet   Oral   Take 1 tablet (88 mcg total) by mouth daily before breakfast. For hypothyroidism.   30 tablet  0   . linagliptin (TRADJENTA) 5 MG TABS tablet   Oral   Take 1 tablet (5 mg total) by mouth daily. For blood sugar control.   30 tablet   0   . meloxicam (MOBIC) 7.5 MG tablet   Oral   Take 1 tablet (7.5 mg total) by mouth 2 (two) times daily in the am and at bedtime.. For pain.   60 tablet   0   . metFORMIN (GLUCOPHAGE) 500 MG tablet   Oral   Take 1 tablet (500 mg total) by mouth 2 (two) times daily with a meal. For blood sugar control.   60 tablet   0   . Oxcarbazepine (TRILEPTAL) 300 MG tablet   Oral   Take 1 tablet (300 mg total) by mouth at bedtime. For mood stabilization.   30 tablet   0   . EXPIRED: paliperidone (INVEGA) 9 MG 24 hr tablet   Oral   Take 1 tablet (9 mg total) by mouth at bedtime. For psychosis.   30 tablet   0   . Paliperidone Palmitate 117 MG/0.75ML SUSP   Intramuscular   Inject 117 mg into the muscle every 30 (thirty) days. For psychosis.  The next  injection is due on June 05, 2012.   0.9 mL      . pantoprazole (PROTONIX) 40 MG tablet   Oral   Take 1 tablet (40 mg total) by mouth at bedtime. For GERD.   30 tablet   0   . sertraline (ZOLOFT) 50 MG tablet   Oral   Take 3 tablets (150 mg total) by mouth daily. For depression and anxiety.   90 tablet   0   . simvastatin (ZOCOR) 40 MG tablet   Oral   Take 1 tablet (40 mg total) by mouth at bedtime. For hyperlipidemia.   30 tablet   0   . EXPIRED: traZODone (DESYREL) 100 MG tablet   Oral   Take 2 tablets (200 mg total) by mouth at bedtime. For sleep.   30 tablet   0   . vitamin E (VITAMIN E) 400 UNIT capsule   Oral   Take 1 capsule (400 Units total) by mouth daily. As a nutritional supplement.   30 capsule   0   . zolpidem (AMBIEN) 5 MG tablet   Oral   Take 1 tablet (5 mg total) by mouth at bedtime. For sleep.   30 tablet   0     BP 119/77  Pulse 110  Temp(Src) 98 F (36.7 C) (Oral)  Resp 20  SpO2 99%  Physical Exam  Nursing note and vitals reviewed. Constitutional: She appears well-developed and well-nourished. No distress.  HENT:  Head: Normocephalic and atraumatic.  Eyes: Pupils are equal, round, and reactive to light.  Neck: Normal range of motion. Neck supple.  Cardiovascular: Normal rate and regular rhythm.   Pulmonary/Chest: Effort normal.  Abdominal: Soft.  Neurological: She is alert.  Skin: Skin is warm and dry.  Psychiatric: Her affect is blunt. Thought content is not paranoid and not delusional. She exhibits a depressed mood. She expresses homicidal and suicidal ideation. She expresses suicidal plans and homicidal plans.  Odd affect    ED Course  Procedures (including critical care time)  Labs Reviewed  CBC  COMPREHENSIVE METABOLIC PANEL  ETHANOL  URINE RAPID DRUG SCREEN (HOSP PERFORMED)   No results found.   1. Suicidal ideation   2. Homicidal ideations   3. Lacerations of multiple sites of  left arm   4. Schizophrenia    5. Bipolar 1 disorder       MDM  ACT consulted. Holding orders placed.  Labs reviewed.        Dorthula Matas, PA 11/02/12 [phone]

## 2012-11-02 NOTE — ED Notes (Signed)
Pt's Surveyor, minerals  w/ theraputic alternatives contacted (w/ pt's permission ) and  is aware that the pt is being dc'd home.  She will see the pt tomorrow and arainge for her to see her psychiatrist  To see her this week.

## 2012-11-02 NOTE — ED Notes (Signed)
Crisis Line for theraputic alternatives--[phone]

## 2012-11-02 NOTE — ED Notes (Signed)
Pharmacy in w/ pt 

## 2012-11-02 NOTE — Progress Notes (Signed)
PHARMACIST - PHYSICIAN COMMUNICATION  DESCRIPTION:   Patients on amlodipine and simvastatin >20mg /day have reported cases of rhabdomyolysis. Per P/T Policy, if simvastatin dose is > 20 mg, substitute atorvastatin (Lipitor) 1mg  for each 2mg  simvastatin and leave communication form advising MD of change. Please take this into consideration at discharge.  Thank you.    If you have questions about this conversion, please contact the Pharmacy Department  []   [phone] )  [facility] []   [phone] )  [facility]  []   [phone] )  [facility] [x]   [phone] )  [facility]   Geoffry Paradise, PharmD, [place] Pager: [phone] 12:23 PM Pharmacy #: [id number]

## 2012-11-13 ENCOUNTER — Inpatient Hospital Stay: Admission: RE | Admit: 2012-11-13 | Payer: Self-pay | Source: Ambulatory Visit

## 2012-12-05 ENCOUNTER — Ambulatory Visit
Admission: RE | Admit: 2012-12-05 | Discharge: 2012-12-05 | Disposition: A | Discharge status: Home / Self Care | Payer: Medicaid Other | Source: Ambulatory Visit | Attending: Internal Medicine | Admitting: Internal Medicine

## 2012-12-05 DIAGNOSIS — Z1231 Encounter for screening mammogram for malignant neoplasm of breast: Secondary | ICD-10-CM

## 2013-07-07 ENCOUNTER — Emergency Department (HOSPITAL_COMMUNITY)
Admission: EM | Admit: 2013-07-07 | Discharge: 2013-07-08 | Disposition: A | Discharge status: Other Acute Inpatient Hospital | Payer: Medicare Other | Attending: Emergency Medicine | Admitting: Emergency Medicine

## 2013-07-07 ENCOUNTER — Encounter (HOSPITAL_COMMUNITY): Payer: Self-pay | Admitting: Emergency Medicine

## 2013-07-07 DIAGNOSIS — E039 Hypothyroidism, unspecified: Secondary | ICD-10-CM | POA: Insufficient documentation

## 2013-07-07 DIAGNOSIS — F23 Brief psychotic disorder: Secondary | ICD-10-CM

## 2013-07-07 DIAGNOSIS — F319 Bipolar disorder, unspecified: Secondary | ICD-10-CM | POA: Insufficient documentation

## 2013-07-07 DIAGNOSIS — G8929 Other chronic pain: Secondary | ICD-10-CM | POA: Insufficient documentation

## 2013-07-07 DIAGNOSIS — Z0289 Encounter for other administrative examinations: Secondary | ICD-10-CM | POA: Insufficient documentation

## 2013-07-07 DIAGNOSIS — I10 Essential (primary) hypertension: Secondary | ICD-10-CM | POA: Insufficient documentation

## 2013-07-07 DIAGNOSIS — F259 Schizoaffective disorder, unspecified: Secondary | ICD-10-CM | POA: Insufficient documentation

## 2013-07-07 DIAGNOSIS — R45851 Suicidal ideations: Secondary | ICD-10-CM

## 2013-07-07 DIAGNOSIS — F209 Schizophrenia, unspecified: Secondary | ICD-10-CM

## 2013-07-07 DIAGNOSIS — F603 Borderline personality disorder: Secondary | ICD-10-CM | POA: Insufficient documentation

## 2013-07-07 DIAGNOSIS — F10929 Alcohol use, unspecified with intoxication, unspecified: Secondary | ICD-10-CM

## 2013-07-07 DIAGNOSIS — K219 Gastro-esophageal reflux disease without esophagitis: Secondary | ICD-10-CM | POA: Insufficient documentation

## 2013-07-07 DIAGNOSIS — M549 Dorsalgia, unspecified: Secondary | ICD-10-CM | POA: Insufficient documentation

## 2013-07-07 DIAGNOSIS — Z87891 Personal history of nicotine dependence: Secondary | ICD-10-CM | POA: Insufficient documentation

## 2013-07-07 DIAGNOSIS — F200 Paranoid schizophrenia: Secondary | ICD-10-CM

## 2013-07-07 DIAGNOSIS — E119 Type 2 diabetes mellitus without complications: Secondary | ICD-10-CM | POA: Insufficient documentation

## 2013-07-07 DIAGNOSIS — R45850 Homicidal ideations: Secondary | ICD-10-CM

## 2013-07-07 DIAGNOSIS — Z79899 Other long term (current) drug therapy: Secondary | ICD-10-CM | POA: Insufficient documentation

## 2013-07-07 DIAGNOSIS — F1010 Alcohol abuse, uncomplicated: Secondary | ICD-10-CM | POA: Insufficient documentation

## 2013-07-07 DIAGNOSIS — E785 Hyperlipidemia, unspecified: Secondary | ICD-10-CM | POA: Insufficient documentation

## 2013-07-07 LAB — CBC
HCT: 35 % — ABNORMAL LOW (ref 36.0–46.0)
Hemoglobin: 11.7 g/dL — ABNORMAL LOW (ref 12.0–15.0)
MCH: 27.1 pg (ref 26.0–34.0)
MCHC: 33.4 g/dL (ref 30.0–36.0)
MCV: 81.2 fL (ref 78.0–100.0)
Platelets: 387 10*3/uL (ref 150–400)
RBC: 4.31 MIL/uL (ref 3.87–5.11)
RDW: 14.3 % (ref 11.5–15.5)
WBC: 7 10*3/uL (ref 4.0–10.5)

## 2013-07-07 LAB — RAPID URINE DRUG SCREEN, HOSP PERFORMED
Amphetamines: NOT DETECTED
Barbiturates: NOT DETECTED
Benzodiazepines: NOT DETECTED
Cocaine: NOT DETECTED
Opiates: NOT DETECTED
Tetrahydrocannabinol: NOT DETECTED

## 2013-07-07 LAB — COMPREHENSIVE METABOLIC PANEL
ALT: 7 U/L (ref 0–35)
AST: 11 U/L (ref 0–37)
Albumin: 4.2 g/dL (ref 3.5–5.2)
Alkaline Phosphatase: 63 U/L (ref 39–117)
BUN: 17 mg/dL (ref 6–23)
CO2: 26 mEq/L (ref 19–32)
Calcium: 10.3 mg/dL (ref 8.4–10.5)
Chloride: 96 mEq/L (ref 96–112)
Creatinine, Ser: 1.07 mg/dL (ref 0.50–1.10)
GFR calc Af Amer: 66 mL/min — ABNORMAL LOW (ref >90)
GFR calc non Af Amer: 57 mL/min — ABNORMAL LOW (ref >90)
Glucose, Bld: 128 mg/dL — ABNORMAL HIGH (ref 70–99)
Potassium: 4.4 mEq/L (ref 3.5–5.1)
Sodium: 133 mEq/L — ABNORMAL LOW (ref 135–145)
Total Bilirubin: 0.2 mg/dL — ABNORMAL LOW (ref 0.3–1.2)
Total Protein: 8.1 g/dL (ref 6.0–8.3)

## 2013-07-07 LAB — ETHANOL: Alcohol, Ethyl (B): <11 mg/dL (ref 0–11)

## 2013-07-07 LAB — SALICYLATE LEVEL: Salicylate Lvl: <2 mg/dL — ABNORMAL LOW (ref 2.8–20.0)

## 2013-07-07 LAB — ACETAMINOPHEN LEVEL: Acetaminophen (Tylenol), Serum: <15 ug/mL (ref 10–30)

## 2013-07-07 MED ORDER — SERTRALINE HCL 50 MG PO TABS
100.0000 mg | ORAL_TABLET | Freq: Two times a day (BID) | ORAL | Status: DC
  Administered 2013-07-07 – 2013-07-08 (×2): 100 mg via ORAL
  Filled 2013-07-07 (×2): qty 2

## 2013-07-07 MED ORDER — DOCUSATE SODIUM 100 MG PO CAPS
100.0000 mg | ORAL_CAPSULE | Freq: Two times a day (BID) | ORAL | Status: DC
  Administered 2013-07-07 – 2013-07-08 (×2): 100 mg via ORAL
  Filled 2013-07-07 (×3): qty 1

## 2013-07-07 MED ORDER — WHITE PETROLATUM GEL
Status: DC | PRN
  Filled 2013-07-07 (×2): qty 5

## 2013-07-07 MED ORDER — OXCARBAZEPINE 300 MG PO TABS
300.0000 mg | ORAL_TABLET | Freq: Every day | ORAL | Status: DC
  Administered 2013-07-07: 300 mg via ORAL
  Filled 2013-07-07 (×2): qty 1

## 2013-07-07 MED ORDER — SIMVASTATIN 40 MG PO TABS
40.0000 mg | ORAL_TABLET | Freq: Every day | ORAL | Status: DC
  Administered 2013-07-07: 40 mg via ORAL
  Filled 2013-07-07 (×2): qty 1

## 2013-07-07 MED ORDER — GLIPIZIDE 10 MG PO TABS
10.0000 mg | ORAL_TABLET | Freq: Two times a day (BID) | ORAL | Status: DC
  Administered 2013-07-08: 10 mg via ORAL
  Filled 2013-07-07 (×3): qty 1

## 2013-07-07 MED ORDER — COLCHICINE 0.6 MG PO TABS
0.6000 mg | ORAL_TABLET | Freq: Two times a day (BID) | ORAL | Status: DC | PRN
  Filled 2013-07-07: qty 1

## 2013-07-07 MED ORDER — METFORMIN HCL 500 MG PO TABS
500.0000 mg | ORAL_TABLET | Freq: Two times a day (BID) | ORAL | Status: DC
Start: 2013-07-08 — End: 2013-07-08
  Administered 2013-07-08: 500 mg via ORAL
  Filled 2013-07-07 (×3): qty 1

## 2013-07-07 MED ORDER — PANTOPRAZOLE SODIUM 40 MG PO TBEC
40.0000 mg | DELAYED_RELEASE_TABLET | Freq: Every day | ORAL | Status: DC
  Administered 2013-07-08: 40 mg via ORAL
  Filled 2013-07-07: qty 1

## 2013-07-07 MED ORDER — LEVOTHYROXINE SODIUM 88 MCG PO TABS
88.0000 ug | ORAL_TABLET | Freq: Every day | ORAL | Status: DC
  Administered 2013-07-08: 88 ug via ORAL
  Filled 2013-07-07 (×2): qty 1

## 2013-07-07 MED ORDER — ASPIRIN 81 MG PO CHEW
81.0000 mg | CHEWABLE_TABLET | Freq: Every day | ORAL | Status: DC
  Administered 2013-07-08: 81 mg via ORAL
  Filled 2013-07-07: qty 1

## 2013-07-07 MED ORDER — INDOMETHACIN 50 MG PO CAPS
50.0000 mg | ORAL_CAPSULE | Freq: Three times a day (TID) | ORAL | Status: DC | PRN
  Filled 2013-07-07: qty 1

## 2013-07-07 MED ORDER — BENZTROPINE MESYLATE 1 MG PO TABS
2.0000 mg | ORAL_TABLET | ORAL | Status: DC
  Administered 2013-07-07 – 2013-07-08 (×2): 2 mg via ORAL
  Filled 2013-07-07 (×3): qty 2

## 2013-07-07 MED ORDER — QUETIAPINE FUMARATE 50 MG PO TABS
50.0000 mg | ORAL_TABLET | Freq: Every day | ORAL | Status: DC
  Administered 2013-07-07: 50 mg via ORAL
  Filled 2013-07-07: qty 1

## 2013-07-07 MED ORDER — AMLODIPINE BESYLATE 10 MG PO TABS
10.0000 mg | ORAL_TABLET | Freq: Every day | ORAL | Status: DC
  Administered 2013-07-08: 10 mg via ORAL
  Filled 2013-07-07: qty 1
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
## 2013-07-07 NOTE — ED Notes (Signed)
Oriented pt to psych ED. 

## 2013-07-07 NOTE — Progress Notes (Addendum)
CSW referred pt to [facility].  Review pending. CSW referred pt to [facility] .  Review pending. CSW referred pt to [facility].  Review pending  .Marva Panda, LCSWA  [phone]  .07/07/2013  7:00 pm

## 2013-07-07 NOTE — ED Notes (Signed)
Patient presents guarded,calm and cooperative; denies any complaints of pain; endorses auditory command hallucinations to harm herself and others; denies any visual hallucinations.

## 2013-07-07 NOTE — Consult Note (Signed)
[facility] Face-to-Face Psychiatry Consult   Reason for Consult:  Evaluation for inpatient treatment Referring Physician:  EDP  [name] is an 57 y.o. female.  Assessment: AXIS I:  Psychotic Disorder NOS AXIS II:  Deferred AXIS III:   Past Medical History  Diagnosis Date  . Diabetes mellitus   . GERD (gastroesophageal reflux disease)   . Borderline personality disorder   . Schizoaffective disorder   . Depression   . Hyperlipidemia   . Chronic back pain   . Hypothyroidism   . Schizophrenia   . Schizoaffective disorder 04/29/2012  . HTN (hypertension) 04/29/2012  . Bipolar affective disorder 04/29/2012  . Diabetes mellitus 04/29/2012   AXIS IV:  other psychosocial or environmental problems AXIS V:  21-30 behavior considerably influenced by delusions or hallucinations OR serious impairment in judgment, communication OR inability to function in almost all areas  Plan:  Recommend psychiatric Inpatient admission when medically cleared.  Subjective:   [name] is a 57 y.o. female.  HPI:  Patient states that she has been hearing voices telling her to hurt and kill herself, and others.  ""I ma having bad thoughts to kill children, other people and my self.  I was at my sisters house this weekend playing with her 3 little children and when I was sitting there I was thinking I could just slit her throat with a knife right now or just stab one in the back.  It is the devil telling me to do it."  Patient states that she has a prior history of hearing voices and takes medication and has outpatient services.  Dr. Allyne Gee ([facility]) told her to come to hospital.  Patient states that sh lives in group home and are having thoughts of kill others in the group home.    HPI Elements:   Location:  [facility] ED. Quality:  Affecting patient mentally. Severity:  Thoughts of killing self and others.  Past Psychiatric History: Past Medical History  Diagnosis Date  . Diabetes mellitus   .  GERD (gastroesophageal reflux disease)   . Borderline personality disorder   . Schizoaffective disorder   . Depression   . Hyperlipidemia   . Chronic back pain   . Hypothyroidism   . Schizophrenia   . Schizoaffective disorder 04/29/2012  . HTN (hypertension) 04/29/2012  . Bipolar affective disorder 04/29/2012  . Diabetes mellitus 04/29/2012    reports that she has quit smoking. She has never used smokeless tobacco. She reports that she does not drink alcohol or use illicit drugs. No family history on file.         Allergies:  No Known Allergies  ACT Assessment Complete:  No:   Past Psychiatric History: Diagnosis:  Schizophrenia, Suicidal and Homicidal ideation  Hospitalizations:  Yes  Outpatient Care:  [facility]  Substance Abuse Care:    Self-Mutilation:    Suicidal Attempts:  Yes  Homicidal Behaviors:  Yes   Violent Behaviors:  Denies   Place of Residence:  Group home Marital Status:   Employed/Unemployed:   Education:   Family Supports:   Objective: Blood pressure 158/85, pulse 87, temperature 98.8 F (37.1 C), temperature source Oral, resp. rate 20, SpO2 100.00%.There is no weight on file to calculate BMI. Results for orders placed during the hospital encounter of 07/07/13 (from the past 72 hour(s))  URINE RAPID DRUG SCREEN (HOSP PERFORMED)     Status: None   Collection Time    07/07/13  1:50 PM  Result Value Range   Opiates NONE DETECTED  NONE DETECTED   Cocaine NONE DETECTED  NONE DETECTED   Benzodiazepines NONE DETECTED  NONE DETECTED   Amphetamines NONE DETECTED  NONE DETECTED   Tetrahydrocannabinol NONE DETECTED  NONE DETECTED   Barbiturates NONE DETECTED  NONE DETECTED   Comment:            DRUG SCREEN FOR MEDICAL PURPOSES     ONLY.  IF CONFIRMATION IS NEEDED     FOR ANY PURPOSE, NOTIFY LAB     WITHIN 5 DAYS.                LOWEST DETECTABLE LIMITS     FOR URINE DRUG SCREEN     Drug Class       Cutoff (ng/mL)     Amphetamine      1000     Barbiturate       200     Benzodiazepine   200     Tricyclics       300     Opiates          300     Cocaine          300     THC              50  ACETAMINOPHEN LEVEL     Status: None   Collection Time    07/07/13  1:55 PM      Result Value Range   Acetaminophen (Tylenol), Serum <15.0  10 - 30 ug/mL   Comment:            THERAPEUTIC CONCENTRATIONS VARY     SIGNIFICANTLY. A RANGE OF 10-30     ug/mL MAY BE AN EFFECTIVE     CONCENTRATION FOR MANY PATIENTS.     HOWEVER, SOME ARE BEST TREATED     AT CONCENTRATIONS OUTSIDE THIS     RANGE.     ACETAMINOPHEN CONCENTRATIONS     >150 ug/mL AT 4 HOURS AFTER     INGESTION AND >50 ug/mL AT 12     HOURS AFTER INGESTION ARE     OFTEN ASSOCIATED WITH TOXIC     REACTIONS.  CBC     Status: Abnormal   Collection Time    07/07/13  1:55 PM      Result Value Range   WBC 7.0  4.0 - 10.5 K/uL   RBC 4.31  3.87 - 5.11 MIL/uL   Hemoglobin 11.7 (*) 12.0 - 15.0 g/dL   HCT 45.4 (*) 09.8 - 11.9 %   MCV 81.2  78.0 - 100.0 fL   MCH 27.1  26.0 - 34.0 pg   MCHC 33.4  30.0 - 36.0 g/dL   RDW 14.7  82.9 - 56.2 %   Platelets 387  150 - 400 K/uL  COMPREHENSIVE METABOLIC PANEL     Status: Abnormal   Collection Time    07/07/13  1:55 PM      Result Value Range   Sodium 133 (*) 135 - 145 mEq/L   Potassium 4.4  3.5 - 5.1 mEq/L   Chloride 96  96 - 112 mEq/L   CO2 26  19 - 32 mEq/L   Glucose, Bld 128 (*) 70 - 99 mg/dL   BUN 17  6 - 23 mg/dL   Creatinine, Ser 1.30  0.50 - 1.10 mg/dL   Calcium 86.5  8.4 - 78.4 mg/dL   Total Protein 8.1  6.0 - 8.3 g/dL  Albumin 4.2  3.5 - 5.2 g/dL   AST 11  0 - 37 U/L   ALT 7  0 - 35 U/L   Alkaline Phosphatase 63  39 - 117 U/L   Total Bilirubin 0.2 (*) 0.3 - 1.2 mg/dL   GFR calc non Af Amer 57 (*) >90 mL/min   GFR calc Af Amer 66 (*) >90 mL/min   Comment: (NOTE)     The eGFR has been calculated using the CKD EPI equation.     This calculation has not been validated in all clinical situations.     eGFR's persistently <90 mL/min signify  possible Chronic Kidney     Disease.  ETHANOL     Status: None   Collection Time    07/07/13  1:55 PM      Result Value Range   Alcohol, Ethyl (B) <11  0 - 11 mg/dL   Comment:            LOWEST DETECTABLE LIMIT FOR     SERUM ALCOHOL IS 11 mg/dL     FOR MEDICAL PURPOSES ONLY  SALICYLATE LEVEL     Status: Abnormal   Collection Time    07/07/13  1:55 PM      Result Value Range   Salicylate Lvl <2.0 (*) 2.8 - 20.0 mg/dL    No current facility-administered medications for this encounter.   Current Outpatient Prescriptions  Medication Sig Dispense Refill  . amLODipine (NORVASC) 10 MG tablet Take 10 mg by mouth daily.      Marland Kitchen aspirin 81 MG chewable tablet Chew 81 mg by mouth daily.      . benztropine (COGENTIN) 2 MG tablet Take 2 mg by mouth 2 (two) times daily.      . colchicine 0.6 MG tablet Take 0.6 mg by mouth 2 (two) times daily as needed (Gout.).      Marland Kitchen docusate sodium (COLACE) 100 MG capsule Take 100 mg by mouth 2 (two) times daily.      Marland Kitchen esomeprazole (NEXIUM) 40 MG capsule Take 40 mg by mouth daily before breakfast.      . glipiZIDE (GLUCOTROL) 10 MG tablet Take 10 mg by mouth 2 (two) times daily before a meal.      . indomethacin (INDOCIN) 50 MG capsule Take 50 mg by mouth 3 (three) times daily as needed (inflammation.).      Marland Kitchen levothyroxine (SYNTHROID, LEVOTHROID) 88 MCG tablet Take 88 mcg by mouth daily before breakfast.      . metFORMIN (GLUCOPHAGE) 500 MG tablet Take 500 mg by mouth 2 (two) times daily with a meal.      . Oxcarbazepine (TRILEPTAL) 300 MG tablet Take 300 mg by mouth at bedtime.      . Paliperidone Palmitate 117 MG/0.75ML SUSP Inject 117 mg into the muscle every 30 (thirty) days. For psychosis.  The next injection is due on June 05, 2012.  0.9 mL    . sertraline (ZOLOFT) 100 MG tablet Take 100 mg by mouth 2 (two) times daily. Take 1 tablet every morning and 1 tablet every day at 2pm.      . simvastatin (ZOCOR) 40 MG tablet Take 40 mg by mouth every  evening.      . traZODone (DESYREL) 100 MG tablet Take 200 mg by mouth at bedtime.      . vitamin E 400 UNIT capsule Take 400 Units by mouth daily.      . benztropine (COGENTIN) 2 MG tablet Take 1 tablet (  2 mg total) by mouth 2 (two) times daily in the am and at bedtime.. For EPS.  60 tablet  0  . glucose blood test strip To use for blood sugar monitoring.  100 each  0  . linagliptin (TRADJENTA) 5 MG TABS tablet Take 1 tablet (5 mg total) by mouth daily. For blood sugar control.  30 tablet  0  . metFORMIN (GLUCOPHAGE) 500 MG tablet Take 1 tablet (500 mg total) by mouth 2 (two) times daily with a meal. For blood sugar control.  60 tablet  0  . Oxcarbazepine (TRILEPTAL) 300 MG tablet Take 1 tablet (300 mg total) by mouth at bedtime. For mood stabilization.  30 tablet  0  . paliperidone (INVEGA) 3 MG 24 hr tablet Take 3 mg by mouth daily as needed (for agitation). Per Facility instruction for agitation      . paliperidone (INVEGA) 9 MG 24 hr tablet Take 1 tablet (9 mg total) by mouth at bedtime. For psychosis.  30 tablet  0  . pantoprazole (PROTONIX) 40 MG tablet Take 1 tablet (40 mg total) by mouth at bedtime. For GERD.  30 tablet  0  . simvastatin (ZOCOR) 40 MG tablet Take 1 tablet (40 mg total) by mouth at bedtime. For hyperlipidemia.  30 tablet  0  . traZODone (DESYREL) 100 MG tablet Take 2 tablets (200 mg total) by mouth at bedtime. For sleep.  30 tablet  0    Psychiatric Specialty Exam:     Blood pressure 158/85, pulse 87, temperature 98.8 F (37.1 C), temperature source Oral, resp. rate 20, SpO2 100.00%.There is no weight on file to calculate BMI.  General Appearance: Casual  Eye Contact::  Good  Speech:  Clear and Coherent and Normal Rate  Volume:  Normal  Mood:  Anxious and Depressed  Affect:  Blunt  Thought Process:  Circumstantial  Orientation:  Full (Time, Place, and Person)  Thought Content:  Hallucinations: Auditory and Rumination  Suicidal Thoughts:  Yes.  without intent/plan   Homicidal Thoughts:  Yes.  without intent/plan  Memory:  Immediate;   Good Recent;   Good Remote;   Good  Judgement:  Impaired  Insight:  Lacking  Psychomotor Activity:  Normal  Concentration:  Fair  Recall:  Good  Akathisia:  No  Handed:  Right  AIMS (if indicated):     Assets:  Communication Skills Desire for Improvement  Sleep:      Face to face interview and consult with Dr. Ladona Ridgel  Treatment Plan Summary: Daily contact with patient to assess and evaluate symptoms and progress in treatment Medication management  Disposition:  Inpatient treatment.  Monitor for safety and stabilization until inpatient bed is found.  Start home medications   Assunta Found, FNP-BC 07/07/2013 4:25 PM

## 2013-07-07 NOTE — Progress Notes (Addendum)
CSW spoke with pt at bedside.  Pt reports that she is her own guardian but that she lives at [facility].  Pt reports that the person in charge of the group home is [place] [phone] and that she leaves at 4:30.  Pt reports that her [facility]T is [facility].  Marland KitchenMarva Panda, LCSWA  [phone]  .07/07/2013 8:00 pm

## 2013-07-07 NOTE — ED Notes (Signed)
Pt c/o of SI/HI. Hx of schizophrenia, depression. Pt endorses plan, states that "I will not tell you, just know I have a plan". ACT team member at bedside.

## 2013-07-08 DIAGNOSIS — F29 Unspecified psychosis not due to a substance or known physiological condition: Secondary | ICD-10-CM

## 2013-07-08 LAB — GLUCOSE, CAPILLARY: Glucose-Capillary: 117 mg/dL — ABNORMAL HIGH (ref 70–99)

## 2013-07-08 LAB — CREATININE, SERUM
Creatinine, Ser: 1.17 mg/dL — ABNORMAL HIGH (ref 0.50–1.10)
GFR calc Af Amer: 60 mL/min — ABNORMAL LOW (ref >90)
GFR calc non Af Amer: 51 mL/min — ABNORMAL LOW (ref >90)

## 2013-07-08 LAB — BUN: BUN: 17 mg/dL (ref 6–23)

## 2013-07-08 MED ORDER — ATORVASTATIN CALCIUM 20 MG PO TABS
20.0000 mg | ORAL_TABLET | Freq: Every day | ORAL | Status: DC
  Filled 2013-07-08: qty 1

## 2013-07-08 MED ORDER — PALIPERIDONE ER 3 MG PO TB24
9.0000 mg | ORAL_TABLET | Freq: Every day | ORAL | Status: DC
  Administered 2013-07-08: 9 mg via ORAL
  Filled 2013-07-08: qty 3

## 2013-07-08 NOTE — Progress Notes (Addendum)
Pt referred to [facility] pending review, no beds at this time but will consider once bed available per Darlene Pt declined at [facility].  No beds available at [facility].   CSW continuing to seek placement for patient. Pt accepted to [facility] pending available bed.

## 2013-07-08 NOTE — Consult Note (Signed)
[facility] Face-to-Face Psychiatry Consult Follow Up   Reason for Consult:  Follow up evaluation for inpatient treatment Referring Physician:  EDP  [name] is an 57 y.o. female.  Assessment: AXIS I:  Psychotic Disorder NOS AXIS II:  Deferred AXIS III:   Past Medical History  Diagnosis Date  . Diabetes mellitus   . GERD (gastroesophageal reflux disease)   . Borderline personality disorder   . Schizoaffective disorder   . Depression   . Hyperlipidemia   . Chronic back pain   . Hypothyroidism   . Schizophrenia   . Schizoaffective disorder 04/29/2012  . HTN (hypertension) 04/29/2012  . Bipolar affective disorder 04/29/2012  . Diabetes mellitus 04/29/2012   AXIS IV:  other psychosocial or environmental problems AXIS V:  21-30 behavior considerably influenced by delusions or hallucinations OR serious impairment in judgment, communication OR inability to function in almost all areas  Plan:  Recommend psychiatric Inpatient admission when medically cleared.  Subjective:   [name] is a 57 y.o. female.  Patient states today she is having thoughts of hurting her self.  "I'm just having thoughts of hurting myself now.  I don't want to kill myself.  Patient states that her thoughts of killing others are diminishing.     HPI Elements:   Location:  [facility] ED. Quality:  Affecting patient mentally. Severity:  Thoughts of killing self and others.  Past Psychiatric History: Past Medical History  Diagnosis Date  . Diabetes mellitus   . GERD (gastroesophageal reflux disease)   . Borderline personality disorder   . Schizoaffective disorder   . Depression   . Hyperlipidemia   . Chronic back pain   . Hypothyroidism   . Schizophrenia   . Schizoaffective disorder 04/29/2012  . HTN (hypertension) 04/29/2012  . Bipolar affective disorder 04/29/2012  . Diabetes mellitus 04/29/2012    reports that she has quit smoking. She has never used smokeless tobacco. She reports that  she does not drink alcohol or use illicit drugs. No family history on file.         Allergies:  No Known Allergies  ACT Assessment Complete:  No:   Past Psychiatric History: Diagnosis:  Schizophrenia, Suicidal and Homicidal ideation  Hospitalizations:  Yes  Outpatient Care:  [facility]  Substance Abuse Care:    Self-Mutilation:    Suicidal Attempts:  Yes  Homicidal Behaviors:  Yes   Violent Behaviors:  Denies   Place of Residence:  Group home Marital Status:   Employed/Unemployed:   Education:   Family Supports:   Objective: Blood pressure 123/78, pulse 68, temperature 98.6 F (37 C), temperature source Oral, resp. rate 17, SpO2 96.00%.There is no weight on file to calculate BMI. Results for orders placed during the hospital encounter of 07/07/13 (from the past 72 hour(s))  URINE RAPID DRUG SCREEN (HOSP PERFORMED)     Status: None   Collection Time    07/07/13  1:50 PM      Result Value Range   Opiates NONE DETECTED  NONE DETECTED   Cocaine NONE DETECTED  NONE DETECTED   Benzodiazepines NONE DETECTED  NONE DETECTED   Amphetamines NONE DETECTED  NONE DETECTED   Tetrahydrocannabinol NONE DETECTED  NONE DETECTED   Barbiturates NONE DETECTED  NONE DETECTED   Comment:            DRUG SCREEN FOR MEDICAL PURPOSES     ONLY.  IF CONFIRMATION IS NEEDED     FOR ANY PURPOSE, NOTIFY LAB  WITHIN 5 DAYS.                LOWEST DETECTABLE LIMITS     FOR URINE DRUG SCREEN     Drug Class       Cutoff (ng/mL)     Amphetamine      1000     Barbiturate      200     Benzodiazepine   200     Tricyclics       300     Opiates          300     Cocaine          300     THC              50  ACETAMINOPHEN LEVEL     Status: None   Collection Time    07/07/13  1:55 PM      Result Value Range   Acetaminophen (Tylenol), Serum <15.0  10 - 30 ug/mL   Comment:            THERAPEUTIC CONCENTRATIONS VARY     SIGNIFICANTLY. A RANGE OF 10-30     ug/mL MAY BE AN EFFECTIVE     CONCENTRATION FOR MANY  PATIENTS.     HOWEVER, SOME ARE BEST TREATED     AT CONCENTRATIONS OUTSIDE THIS     RANGE.     ACETAMINOPHEN CONCENTRATIONS     >150 ug/mL AT 4 HOURS AFTER     INGESTION AND >50 ug/mL AT 12     HOURS AFTER INGESTION ARE     OFTEN ASSOCIATED WITH TOXIC     REACTIONS.  CBC     Status: Abnormal   Collection Time    07/07/13  1:55 PM      Result Value Range   WBC 7.0  4.0 - 10.5 K/uL   RBC 4.31  3.87 - 5.11 MIL/uL   Hemoglobin 11.7 (*) 12.0 - 15.0 g/dL   HCT 45.4 (*) 09.8 - 11.9 %   MCV 81.2  78.0 - 100.0 fL   MCH 27.1  26.0 - 34.0 pg   MCHC 33.4  30.0 - 36.0 g/dL   RDW 14.7  82.9 - 56.2 %   Platelets 387  150 - 400 K/uL  COMPREHENSIVE METABOLIC PANEL     Status: Abnormal   Collection Time    07/07/13  1:55 PM      Result Value Range   Sodium 133 (*) 135 - 145 mEq/L   Potassium 4.4  3.5 - 5.1 mEq/L   Chloride 96  96 - 112 mEq/L   CO2 26  19 - 32 mEq/L   Glucose, Bld 128 (*) 70 - 99 mg/dL   BUN 17  6 - 23 mg/dL   Creatinine, Ser 1.30  0.50 - 1.10 mg/dL   Calcium 86.5  8.4 - 78.4 mg/dL   Total Protein 8.1  6.0 - 8.3 g/dL   Albumin 4.2  3.5 - 5.2 g/dL   AST 11  0 - 37 U/L   ALT 7  0 - 35 U/L   Alkaline Phosphatase 63  39 - 117 U/L   Total Bilirubin 0.2 (*) 0.3 - 1.2 mg/dL   GFR calc non Af Amer 57 (*) >90 mL/min   GFR calc Af Amer 66 (*) >90 mL/min   Comment: (NOTE)     The eGFR has been calculated using the CKD EPI equation.     This calculation has  not been validated in all clinical situations.     eGFR's persistently <90 mL/min signify possible Chronic Kidney     Disease.  ETHANOL     Status: None   Collection Time    07/07/13  1:55 PM      Result Value Range   Alcohol, Ethyl (B) <11  0 - 11 mg/dL   Comment:            LOWEST DETECTABLE LIMIT FOR     SERUM ALCOHOL IS 11 mg/dL     FOR MEDICAL PURPOSES ONLY  SALICYLATE LEVEL     Status: Abnormal   Collection Time    07/07/13  1:55 PM      Result Value Range   Salicylate Lvl <2.0 (*) 2.8 - 20.0 mg/dL  GLUCOSE,  CAPILLARY     Status: Abnormal   Collection Time    07/08/13  8:10 AM      Result Value Range   Glucose-Capillary 117 (*) 70 - 99 mg/dL    Current Facility-Administered Medications  Medication Dose Route Frequency Provider Last Rate Last Dose  . amLODipine (NORVASC) tablet 10 mg  10 mg Oral Daily Junius Argyle, MD      . aspirin chewable tablet 81 mg  81 mg Oral Daily Junius Argyle, MD      . benztropine (COGENTIN) tablet 2 mg  2 mg Oral BH-qamhs Junius Argyle, MD   2 mg at 07/08/13 0819  . colchicine tablet 0.6 mg  0.6 mg Oral BID PRN Junius Argyle, MD      . docusate sodium (COLACE) capsule 100 mg  100 mg Oral BID Junius Argyle, MD   100 mg at 07/07/13 2213  . glipiZIDE (GLUCOTROL) tablet 10 mg  10 mg Oral BID AC Junius Argyle, MD   10 mg at 07/08/13 1610  . indomethacin (INDOCIN) capsule 50 mg  50 mg Oral TID PRN Junius Argyle, MD      . levothyroxine (SYNTHROID, LEVOTHROID) tablet 88 mcg  88 mcg Oral QAC breakfast Junius Argyle, MD   88 mcg at 07/08/13 [phone]  . metFORMIN (GLUCOPHAGE) tablet 500 mg  500 mg Oral BID WC Junius Argyle, MD   500 mg at 07/08/13 5409  . Oxcarbazepine (TRILEPTAL) tablet 300 mg  300 mg Oral QHS Junius Argyle, MD   300 mg at 07/07/13 2214  . pantoprazole (PROTONIX) EC tablet 40 mg  40 mg Oral Daily Junius Argyle, MD      . QUEtiapine (SEROQUEL) tablet 50 mg  50 mg Oral QHS Junius Argyle, MD   50 mg at 07/07/13 2213  . sertraline (ZOLOFT) tablet 100 mg  100 mg Oral BID Junius Argyle, MD   100 mg at 07/07/13 2214  . simvastatin (ZOCOR) tablet 40 mg  40 mg Oral QHS Junius Argyle, MD   40 mg at 07/07/13 2213  . white petrolatum (VASELINE) gel   Topical PRN Junius Argyle, MD       Current Outpatient Prescriptions  Medication Sig Dispense Refill  . amLODipine (NORVASC) 10 MG tablet Take 10 mg by mouth daily.      Marland Kitchen aspirin 81 MG chewable tablet Chew 81 mg by mouth daily.      . benztropine  (COGENTIN) 2 MG tablet Take 2 mg by mouth 2 (two) times daily.      . colchicine 0.6 MG tablet Take 0.6 mg by mouth 2 (two) times  daily as needed (Gout.).      Marland Kitchen docusate sodium (COLACE) 100 MG capsule Take 100 mg by mouth 2 (two) times daily.      Marland Kitchen esomeprazole (NEXIUM) 40 MG capsule Take 40 mg by mouth daily before breakfast.      . glipiZIDE (GLUCOTROL) 10 MG tablet Take 10 mg by mouth 2 (two) times daily before a meal.      . indomethacin (INDOCIN) 50 MG capsule Take 50 mg by mouth 3 (three) times daily as needed (inflammation.).      Marland Kitchen levothyroxine (SYNTHROID, LEVOTHROID) 88 MCG tablet Take 88 mcg by mouth daily before breakfast.      . metFORMIN (GLUCOPHAGE) 500 MG tablet Take 500 mg by mouth 2 (two) times daily with a meal.      . Oxcarbazepine (TRILEPTAL) 300 MG tablet Take 300 mg by mouth at bedtime.      . paliperidone (INVEGA) 9 MG 24 hr tablet Take 9 mg by mouth at bedtime.      . Paliperidone Palmitate 117 MG/0.75ML SUSP Inject 117 mg into the muscle every 30 (thirty) days. For psychosis.  The next injection is due on June 05, 2012.  0.9 mL    . polyethylene glycol (MIRALAX / GLYCOLAX) packet Take 17 g by mouth daily.      . QUEtiapine (SEROQUEL) 100 MG tablet Take 50 mg by mouth at bedtime.      . sertraline (ZOLOFT) 100 MG tablet Take 100 mg by mouth 2 (two) times daily. Take 1 tablet every morning and 1 tablet every day at 2pm.      . simvastatin (ZOCOR) 40 MG tablet Take 40 mg by mouth every evening.      . traZODone (DESYREL) 100 MG tablet Take 200 mg by mouth at bedtime.      . vitamin E 400 UNIT capsule Take 400 Units by mouth daily.      . benztropine (COGENTIN) 2 MG tablet Take 1 tablet (2 mg total) by mouth 2 (two) times daily in the am and at bedtime.. For EPS.  60 tablet  0  . metFORMIN (GLUCOPHAGE) 500 MG tablet Take 1 tablet (500 mg total) by mouth 2 (two) times daily with a meal. For blood sugar control.  60 tablet  0  . Oxcarbazepine (TRILEPTAL) 300 MG tablet  Take 1 tablet (300 mg total) by mouth at bedtime. For mood stabilization.  30 tablet  0  . paliperidone (INVEGA) 9 MG 24 hr tablet Take 1 tablet (9 mg total) by mouth at bedtime. For psychosis.  30 tablet  0  . pantoprazole (PROTONIX) 40 MG tablet Take 1 tablet (40 mg total) by mouth at bedtime. For GERD.  30 tablet  0  . simvastatin (ZOCOR) 40 MG tablet Take 1 tablet (40 mg total) by mouth at bedtime. For hyperlipidemia.  30 tablet  0  . traZODone (DESYREL) 100 MG tablet Take 2 tablets (200 mg total) by mouth at bedtime. For sleep.  30 tablet  0    Psychiatric Specialty Exam:     Blood pressure 123/78, pulse 68, temperature 98.6 F (37 C), temperature source Oral, resp. rate 17, SpO2 96.00%.There is no weight on file to calculate BMI.  General Appearance: Casual  Eye Contact::  Good  Speech:  Clear and Coherent and Normal Rate  Volume:  Normal  Mood:  Anxious and Depressed  Affect:  Blunt  Thought Process:  Circumstantial  Orientation:  Full (Time, Place, and Person)  Thought  Content:  Hallucinations: Auditory and Rumination  Suicidal Thoughts:  Yes.  without intent/plan  Homicidal Thoughts:  Yes.  without intent/plan  Memory:  Immediate;   Good Recent;   Good Remote;   Good  Judgement:  Impaired  Insight:  Lacking  Psychomotor Activity:  Normal  Concentration:  Fair  Recall:  Good  Akathisia:  No  Handed:  Right  AIMS (if indicated):     Assets:  Communication Skills Desire for Improvement  Sleep:      Face to face interview and consult with Dr. Lolly Mustache  Treatment Plan Summary: Daily contact with patient to assess and evaluate symptoms and progress in treatment Medication management  Disposition:  Continue with current disposition for inpatient treatment.  Consider starting Lithium 300 mg daily after results of BUN and creatinine results are reviewed.  Restart Invega 9 mg daily.  Continue to monitor for safety and stabilization until inpatient bed is found.   Assunta Found, FNP-BC 07/08/2013 9:33 AM  I have personally seen the patient and agreed with the findings and involved in the treatment plan. Kathryne Sharper, MD

## 2013-07-08 NOTE — Progress Notes (Signed)
PHARMACY BRIEF NOTE  Per [place] formulary, have substituted atorvastatin 20mg  for simvastatin 40mg  to avoid drug interaction with amlodipine. Please consider this drug interaction when prescribing discharge medications.  Thank you,  Elie Goody, PharmD, BCPS Pager: [phone] 07/08/2013  1:05 PM

## 2013-07-08 NOTE — Progress Notes (Signed)
Pt accepted to [facility],  by Dr. Wendall Stade. RN can call report to [phone]. CSW informed EDP and RN. RN to arrange transportation with [facility], and pt to be transported voluntarily.   Catha Gosselin, LCSW [phone]  ED CSW .07/08/2013 1135am

## 2013-07-08 NOTE — BH Assessment (Signed)
The [facility] ACTT Team faxed the patients most recent MAR sheet.

## 2013-07-08 NOTE — ED Notes (Signed)
Pt. Accepted to [facility], patient informed.  Report called to Windy Canny, RN at [facility].  Transportation arranged for patient.  Pt. Aware.

## 2013-07-08 NOTE — Progress Notes (Signed)
Pt signed consent to release information to pt sister, Rhona, [facility], and [facility]. CSW spoke with [facility] ([phone])  Home who shared that patient has been taking medications as directed. Per group home, they fill pt visit this weekend with her sister and her sister grand kids triggered. Per group home, patient had thoughts of feeling like she would break the children if she held them, and then had voices telling her to kill them. Pt group hoem stated the children are 4 months, 1 year, and 3 1/2.    Pt act team is [facility] [phone].    Catha Gosselin, LCSW [phone]  ED CSW .07/08/2013 1016am

## 2013-07-08 NOTE — ED Notes (Signed)
Pt. Is awake, alert and oriented with a flat affect.  She is still reporting SI/AVH but contracts for safety. Pt. Is cooperative and acting appropriately for situation.   She is awaiting MD consult.

## 2013-07-09 NOTE — Consult Note (Signed)
Note reviewed and agreed with  

## 2013-07-14 ENCOUNTER — Ambulatory Visit: Payer: Self-pay | Admitting: Podiatry

## 2013-08-04 ENCOUNTER — Encounter: Payer: Self-pay | Admitting: Podiatry

## 2013-08-04 ENCOUNTER — Ambulatory Visit (INDEPENDENT_AMBULATORY_CARE_PROVIDER_SITE_OTHER): Payer: Medicare Other | Admitting: Podiatry

## 2013-08-04 VITALS — BP 128/71 | HR 62 | Resp 12 | Ht 64.0 in | Wt 235.0 lb

## 2013-08-04 DIAGNOSIS — M79609 Pain in unspecified limb: Secondary | ICD-10-CM

## 2013-08-04 DIAGNOSIS — B351 Tinea unguium: Secondary | ICD-10-CM

## 2013-08-04 NOTE — Progress Notes (Signed)
[name] presents today for chief complaint of painful toenails bilateral.  Objective: Pulses are palpable bilateral. Nails are thick yellow dystrophic clinically mycotic.  Assessment: Pain in limb secondary to onychomycosis.  Plan: Debridement nails in thickness and length as a covered service secondary to pain followup with her in 3 months.

## 2013-09-15 ENCOUNTER — Encounter: Payer: Self-pay | Admitting: Podiatry

## 2013-09-15 ENCOUNTER — Ambulatory Visit (INDEPENDENT_AMBULATORY_CARE_PROVIDER_SITE_OTHER): Payer: Medicare Other

## 2013-09-15 ENCOUNTER — Ambulatory Visit (INDEPENDENT_AMBULATORY_CARE_PROVIDER_SITE_OTHER): Payer: Medicare Other | Admitting: Podiatry

## 2013-09-15 VITALS — Ht 65.0 in | Wt 240.0 lb

## 2013-09-15 DIAGNOSIS — M722 Plantar fascial fibromatosis: Secondary | ICD-10-CM

## 2013-09-15 DIAGNOSIS — R52 Pain, unspecified: Secondary | ICD-10-CM

## 2013-09-15 NOTE — Progress Notes (Signed)
   Subjective:    Patient ID: [name], female    DOB: [birth year], 57 y.o.   MRN: [id number]  HPI Comments: '' LT FOOT HEEL IS BEEN HURTING SINCE AUGUST. TREATMENT TRIED SOAKING AND MEDICATION, BUT STILL HURTING.  Foot Pain      Review of Systems     Objective:   Physical Exam: Vital signs are stable she is alert and oriented x3 pulses are palpable left foot. Orthopedic evaluation demonstrates pain on palpation of the posterior tibial tendon left with overlying mild edema. She also has pain on palpation medial continued tubercle of the left heel. Radiographic evaluation does demonstrate soft tissue increase in density at the plantar fascial calcaneal insertion site with a plantar distally oriented calcaneal heel spur.        Assessment & Plan:  Assessment: Plantar fasciitis left foot.  Plan: Discussed etiology pathology conservative versus surgical therapies. Injected the left heel today at the point of maximal tenderness in the plantar fascial calcaneal insertion site. Applied a plantar fascial strapping to be left on for 2 days.

## 2013-11-10 ENCOUNTER — Ambulatory Visit (INDEPENDENT_AMBULATORY_CARE_PROVIDER_SITE_OTHER): Payer: Medicare Other | Admitting: Podiatry

## 2013-11-10 ENCOUNTER — Encounter: Payer: Self-pay | Admitting: Podiatry

## 2013-11-10 VITALS — BP 102/48 | HR 107 | Resp 16

## 2013-11-10 DIAGNOSIS — B351 Tinea unguium: Secondary | ICD-10-CM

## 2013-11-10 DIAGNOSIS — M722 Plantar fascial fibromatosis: Secondary | ICD-10-CM

## 2013-11-10 DIAGNOSIS — M79609 Pain in unspecified limb: Secondary | ICD-10-CM

## 2013-11-10 NOTE — Patient Instructions (Signed)
Diabetes and Foot Care Diabetes may cause you to have problems because of poor blood supply (circulation) to your feet and legs. This may cause the skin on your feet to become thinner, break easier, and heal more slowly. Your skin may become dry, and the skin may peel and crack. You may also have nerve damage in your legs and feet causing decreased feeling in them. You may not notice minor injuries to your feet that could lead to infections or more serious problems. Taking care of your feet is one of the most important things you can do for yourself.  HOME CARE INSTRUCTIONS  Wear shoes at all times, even in the house. Do not go barefoot. Bare feet are easily injured.  Check your feet daily for blisters, cuts, and redness. If you cannot see the bottom of your feet, use a mirror or ask someone for help.  Wash your feet with warm water (do not use hot water) and mild soap. Then pat your feet and the areas between your toes until they are completely dry. Do not soak your feet as this can dry your skin.  Apply a moisturizing lotion or petroleum jelly (that does not contain alcohol and is unscented) to the skin on your feet and to dry, brittle toenails. Do not apply lotion between your toes.  Trim your toenails straight across. Do not dig under them or around the cuticle. File the edges of your nails with an emery board or nail file.  Do not cut corns or calluses or try to remove them with medicine.  Wear clean socks or stockings every day. Make sure they are not too tight. Do not wear knee-high stockings since they may decrease blood flow to your legs.  Wear shoes that fit properly and have enough cushioning. To break in new shoes, wear them for just a few hours a day. This prevents you from injuring your feet. Always look in your shoes before you put them on to be sure there are no objects inside.  Do not cross your legs. This may decrease the blood flow to your feet.  If you find a minor scrape,  cut, or break in the skin on your feet, keep it and the skin around it clean and dry. These areas may be cleansed with mild soap and water. Do not cleanse the area with peroxide, alcohol, or iodine.  When you remove an adhesive bandage, be sure not to damage the skin around it.  If you have a wound, look at it several times a day to make sure it is healing.  Do not use heating pads or hot water bottles. They may burn your skin. If you have lost feeling in your feet or legs, you may not know it is happening until it is too late.  Make sure your health care provider performs a complete foot exam at least annually or more often if you have foot problems. Report any cuts, sores, or bruises to your health care provider immediately. SEEK MEDICAL CARE IF:   You have an injury that is not healing.  You have cuts or breaks in the skin.  You have an ingrown nail.  You notice redness on your legs or feet.  You feel burning or tingling in your legs or feet.  You have pain or cramps in your legs and feet.  Your legs or feet are numb.  Your feet always feel cold. SEEK IMMEDIATE MEDICAL CARE IF:   There is increasing redness,   swelling, or pain in or around a wound.  There is a red line that goes up your leg.  Pus is coming from a wound.  You develop a fever or as directed by your health care provider.  You notice a bad smell coming from an ulcer or wound. Document Released: 08/31/2000 Document Revised: 05/06/2013 Document Reviewed: 02/10/2013 Exit[employer] Patient Information 2014 [employer].  

## 2013-11-10 NOTE — Progress Notes (Signed)
She presents today with a chief complaint of painful toenails one through 5 bilateral.  Objective: Vital signs are stable she is alert and oriented x3. Pulses are strongly palpable bilateral. Nails are thick yellow dystrophic with mycotic and painful palpation.  Assessment: Pain in limb secondary to onychomycosis 1 through 5 bilateral.  Plan: Debridement of nails 1 through 5 bilateral covered service secondary to pain.

## 2014-01-12 ENCOUNTER — Other Ambulatory Visit: Payer: Self-pay | Admitting: Internal Medicine

## 2014-01-12 DIAGNOSIS — Z1231 Encounter for screening mammogram for malignant neoplasm of breast: Secondary | ICD-10-CM

## 2014-02-09 ENCOUNTER — Ambulatory Visit: Payer: Medicare Other | Admitting: Podiatry

## 2014-03-05 ENCOUNTER — Ambulatory Visit
Admission: RE | Admit: 2014-03-05 | Discharge: 2014-03-05 | Disposition: A | Discharge status: Home / Self Care | Payer: Medicare Other | Source: Ambulatory Visit | Attending: Internal Medicine | Admitting: Internal Medicine

## 2014-03-05 DIAGNOSIS — Z1231 Encounter for screening mammogram for malignant neoplasm of breast: Secondary | ICD-10-CM

## 2014-08-25 ENCOUNTER — Ambulatory Visit (HOSPITAL_BASED_OUTPATIENT_CLINIC_OR_DEPARTMENT_OTHER): Payer: Medicare Other | Attending: Family Medicine | Admitting: Radiology

## 2014-08-25 VITALS — Ht 65.0 in | Wt 235.0 lb

## 2014-08-25 DIAGNOSIS — R400 Somnolence: Secondary | ICD-10-CM | POA: Diagnosis not present

## 2014-08-25 DIAGNOSIS — R0683 Snoring: Secondary | ICD-10-CM | POA: Diagnosis not present

## 2014-08-25 DIAGNOSIS — G4719 Other hypersomnia: Secondary | ICD-10-CM

## 2014-08-30 NOTE — Addendum Note (Signed)
Addended by: Leanor Rubenstein on: 08/30/2014 08:25 AM   Modules accepted: Level of Service

## 2014-09-04 ENCOUNTER — Ambulatory Visit (HOSPITAL_BASED_OUTPATIENT_CLINIC_OR_DEPARTMENT_OTHER): Payer: Medicare Other | Admitting: Internal Medicine

## 2014-09-04 DIAGNOSIS — R0683 Snoring: Secondary | ICD-10-CM

## 2014-09-04 DIAGNOSIS — G4719 Other hypersomnia: Secondary | ICD-10-CM

## 2014-09-04 DIAGNOSIS — G4730 Sleep apnea, unspecified: Secondary | ICD-10-CM

## 2014-09-04 DIAGNOSIS — G4710 Hypersomnia, unspecified: Secondary | ICD-10-CM

## 2014-09-04 NOTE — Sleep Study (Signed)
   NAME: [name] DATE OF BIRTH:  12-14-1955 MEDICAL RECORD NUMBER [id number]  LOCATION: [facility]  PHYSICIAN: Gerhard Rappaport D  DATE OF STUDY: 08/25/2014  SLEEP STUDY TYPE: Nocturnal Polysomnogram               REFERRING PHYSICIAN: Leonard Downing, *  INDICATION FOR STUDY: Hypersomnia with sleep apnea  EPWORTH SLEEPINESS SCORE:   6/24 HEIGHT: 5\' 5"  (165.1 cm)  WEIGHT: 235 lb (106.595 kg)    Body mass index is 39.11 kg/(m^2).  NECK SIZE: 15 in.  MEDICATIONS: Charted for review  SLEEP ARCHITECTURE: Split study protocol. During the diagnostic phase, total sleep time 147.5 minutes with sleep efficiency 76.4%. Stage I was 7.8%, stage II 66.1%, stage III 26.1%, REM absent. Sleep latency 14 minutes, awake after sleep onset 27.5 minutes, arousal index 13.8, bedtime medication: None  RESPIRATORY DATA: Apnea hypopnea index (AHI) 12.6 per hour. 31 total events scored including one obstructive apnea, 1 central apnea, 29 hypopneas. Events were more common while supine. CPAP titration to 16 CWP, AHI 0 per hour. She wore a medium fullface mask.  OXYGEN DATA: Moderate snoring before CPAP with oxygen desaturation to a nadir of 87% on room air. With CPAP control snoring was prevented and mean oxygen saturation was 92.4%.  CARDIAC DATA: Sinus rhythm with frequent PVCs  MOVEMENT/PARASOMNIA: No significant movement disturbance, bathroom 2  IMPRESSION/ RECOMMENDATION:   1) Mild obstructive sleep apnea/hypopnea syndrome, AHI 12.6 per hour. Moderate snoring with oxygen desaturation to a nadir of 87% on room air. 2) Successful CPAP titration to 16 CWP, AHI 0 per hour. She wore a medium [employer] Simplus fullface mask with heated humidifier. Snoring was prevented and mean oxygen saturation was 92.4%.   Deneise Lever Diplomate, American Board of Sleep Medicine  ELECTRONICALLY SIGNED ON:  09/04/2014, 10:33 AM [place] PH: [phone]  [phone]   FX: [phone] [place]

## 2014-10-18 ENCOUNTER — Emergency Department (HOSPITAL_COMMUNITY): Payer: Medicare Other

## 2014-10-18 ENCOUNTER — Emergency Department (HOSPITAL_COMMUNITY)
Admission: EM | Admit: 2014-10-18 | Discharge: 2014-10-19 | Disposition: A | Discharge status: Home / Self Care | Payer: Medicare Other | Attending: Emergency Medicine | Admitting: Emergency Medicine

## 2014-10-18 ENCOUNTER — Encounter (HOSPITAL_COMMUNITY): Payer: Self-pay

## 2014-10-18 DIAGNOSIS — Z7982 Long term (current) use of aspirin: Secondary | ICD-10-CM | POA: Insufficient documentation

## 2014-10-18 DIAGNOSIS — R0789 Other chest pain: Secondary | ICD-10-CM | POA: Diagnosis not present

## 2014-10-18 DIAGNOSIS — Z79899 Other long term (current) drug therapy: Secondary | ICD-10-CM | POA: Diagnosis not present

## 2014-10-18 DIAGNOSIS — F209 Schizophrenia, unspecified: Secondary | ICD-10-CM | POA: Insufficient documentation

## 2014-10-18 DIAGNOSIS — E119 Type 2 diabetes mellitus without complications: Secondary | ICD-10-CM | POA: Diagnosis not present

## 2014-10-18 DIAGNOSIS — E039 Hypothyroidism, unspecified: Secondary | ICD-10-CM | POA: Insufficient documentation

## 2014-10-18 DIAGNOSIS — E785 Hyperlipidemia, unspecified: Secondary | ICD-10-CM | POA: Diagnosis not present

## 2014-10-18 DIAGNOSIS — K219 Gastro-esophageal reflux disease without esophagitis: Secondary | ICD-10-CM | POA: Diagnosis not present

## 2014-10-18 DIAGNOSIS — G8929 Other chronic pain: Secondary | ICD-10-CM | POA: Diagnosis not present

## 2014-10-18 DIAGNOSIS — Z87891 Personal history of nicotine dependence: Secondary | ICD-10-CM | POA: Insufficient documentation

## 2014-10-18 DIAGNOSIS — I10 Essential (primary) hypertension: Secondary | ICD-10-CM | POA: Diagnosis not present

## 2014-10-18 DIAGNOSIS — F319 Bipolar disorder, unspecified: Secondary | ICD-10-CM | POA: Diagnosis not present

## 2014-10-18 DIAGNOSIS — R419 Unspecified symptoms and signs involving cognitive functions and awareness: Secondary | ICD-10-CM | POA: Diagnosis present

## 2014-10-18 LAB — BASIC METABOLIC PANEL
Anion gap: 9 (ref 5–15)
BUN: 13 mg/dL (ref 6–23)
CO2: 27 mmol/L (ref 19–32)
Calcium: 9.2 mg/dL (ref 8.4–10.5)
Chloride: 102 mmol/L (ref 96–112)
Creatinine, Ser: 1.06 mg/dL (ref 0.50–1.10)
GFR calc Af Amer: 67 mL/min — ABNORMAL LOW (ref >90)
GFR calc non Af Amer: 58 mL/min — ABNORMAL LOW (ref >90)
Glucose, Bld: 153 mg/dL — ABNORMAL HIGH (ref 70–99)
Potassium: 3.9 mmol/L (ref 3.5–5.1)
Sodium: 138 mmol/L (ref 135–145)

## 2014-10-18 LAB — CBC
HCT: 39.8 % (ref 36.0–46.0)
Hemoglobin: 13.4 g/dL (ref 12.0–15.0)
MCH: 30.5 pg (ref 26.0–34.0)
MCHC: 33.7 g/dL (ref 30.0–36.0)
MCV: 90.7 fL (ref 78.0–100.0)
Platelets: 253 10*3/uL (ref 150–400)
RBC: 4.39 MIL/uL (ref 3.87–5.11)
RDW: 13.2 % (ref 11.5–15.5)
WBC: 9 10*3/uL (ref 4.0–10.5)

## 2014-10-18 LAB — I-STAT TROPONIN, ED: Troponin i, poc: 0 ng/mL (ref 0.00–0.08)

## 2014-10-18 NOTE — ED Provider Notes (Signed)
CSN: [id number]     Arrival date & time 10/18/14  1916 History   First MD Initiated Contact with Patient 10/18/14 2114     Chief Complaint  Patient presents with  . Anxiety     (Consider location/radiation/quality/duration/timing/severity/associated sxs/prior Treatment) HPI [name] is a 59 y.o. female with history of psychosis NOS, comes in for evaluation of anxiety and chest discomfort. Patient states last Thursday she had a nightmare that her parents were trying to kill her which woke her up and caused her to have a chest tightness. She locates the discomfort to the center of her chest and says it feels "like a fist". She reports the discomfort lasts for approximately 30-45 minutes and when it occurs it is a 7/10. This discomfort does not radiate. There are no other modifying factors. She denies any discomfort at this time, but feels that her chest discomfort is due to her anxiety. Denies headaches, changes in vision, chest pain, short of breath, cough, nausea or vomiting, diaphoresis, abdominal pain, diarrhea or constipation, numbness or weakness, urinary symptoms, syncope. She denies suicidal or homicidal thoughts currently, but admits last week she was told to kill her niece by the voices in her head. She reports that the voices in her head are there constantly and sometimes they tell her to kill herself and sometimes other people. She denies any other illicit drug use.  Past Medical History  Diagnosis Date  . Diabetes mellitus   . GERD (gastroesophageal reflux disease)   . Borderline personality disorder   . Schizoaffective disorder   . Depression   . Hyperlipidemia   . Chronic back pain   . Hypothyroidism   . Schizophrenia   . Schizoaffective disorder 04/29/2012  . HTN (hypertension) 04/29/2012  . Bipolar affective disorder 04/29/2012  . Diabetes mellitus 04/29/2012   Past Surgical History  Procedure Laterality Date  . Appendectomy    . Knee surgery      right  .  Cholecystectomy     No family history on file. History  Substance Use Topics  . Smoking status: Former Research scientist (life sciences)  . Smokeless tobacco: Never Used  . Alcohol Use: No   OB History    No data available     Review of Systems  All other systems reviewed and are negative.  A 10 point review of systems was completed and was negative except for pertinent positives and negatives as mentioned in the history of present illness     Allergies  Review of patient's allergies indicates no known allergies.  Home Medications   Prior to Admission medications   Medication Sig Start Date End Date Taking? Authorizing Provider  amLODipine (NORVASC) 10 MG tablet Take 10 mg by mouth daily.   Yes Historical Provider, MD  aspirin 81 MG chewable tablet Chew 81 mg by mouth daily.    Historical Provider, MD  benztropine (COGENTIN) 2 MG tablet Take 2 mg by mouth 2 (two) times daily.    Historical Provider, MD  colchicine 0.6 MG tablet Take 0.6 mg by mouth 2 (two) times daily as needed (Gout.).    Historical Provider, MD  divalproex (DEPAKOTE ER) 500 MG 24 hr tablet  08/28/13   Historical Provider, MD  docusate sodium (COLACE) 100 MG capsule Take 100 mg by mouth 2 (two) times daily.    Historical Provider, MD  escitalopram (LEXAPRO) 10 MG tablet  08/28/13   Historical Provider, MD  esomeprazole (NEXIUM) 40 MG capsule Take 40 mg by mouth daily before  breakfast.    Historical Provider, MD  glipiZIDE (GLUCOTROL) 10 MG tablet Take 10 mg by mouth 2 (two) times daily before a meal.    Historical Provider, MD  indomethacin (INDOCIN) 50 MG capsule Take 50 mg by mouth 3 (three) times daily as needed (inflammation.).    Historical Provider, MD  levothyroxine (SYNTHROID, LEVOTHROID) 88 MCG tablet Take 88 mcg by mouth daily before breakfast.    Historical Provider, MD  metFORMIN (GLUCOPHAGE) 500 MG tablet Take 500 mg by mouth 2 (two) times daily with a meal.    Historical Provider, MD  Oxcarbazepine (TRILEPTAL) 300 MG  tablet Take 300 mg by mouth at bedtime.    Historical Provider, MD  paliperidone (INVEGA) 9 MG 24 hr tablet Take 9 mg by mouth at bedtime.    Historical Provider, MD  Paliperidone Palmitate 117 MG/0.75ML SUSP Inject 117 mg into the muscle every 30 (thirty) days. For psychosis.  The next injection is due on June 05, 2012. 05/15/12   Mellissa Kohut, MD  pantoprazole (PROTONIX) 40 MG tablet  08/27/13   Historical Provider, MD  polyethylene glycol (MIRALAX / GLYCOLAX) packet Take 17 g by mouth daily.    Historical Provider, MD  QUEtiapine (SEROQUEL) 100 MG tablet Take 50 mg by mouth at bedtime.    Historical Provider, MD  sertraline (ZOLOFT) 100 MG tablet Take 100 mg by mouth 2 (two) times daily. Take 1 tablet every morning and 1 tablet every day at 2pm.    Historical Provider, MD  simvastatin (ZOCOR) 40 MG tablet Take 40 mg by mouth every evening.    Historical Provider, MD  traZODone (DESYREL) 100 MG tablet Take 200 mg by mouth at bedtime.    Historical Provider, MD  vitamin E 400 UNIT capsule Take 400 Units by mouth daily.    Historical Provider, MD   BP 154/85 mmHg  Pulse 75  Temp(Src) 97.8 F (36.6 C) (Oral)  Resp 18  SpO2 97% Physical Exam  Constitutional: She is oriented to person, place, and time. She appears well-developed and well-nourished.  HENT:  Head: Normocephalic and atraumatic.  Mouth/Throat: Oropharynx is clear and moist.  Eyes: Conjunctivae are normal. Pupils are equal, round, and reactive to light. Right eye exhibits no discharge. Left eye exhibits no discharge. No scleral icterus.  Neck: Neck supple.  Cardiovascular: Normal rate, regular rhythm and normal heart sounds.   Pulmonary/Chest: Effort normal and breath sounds normal. No respiratory distress. She has no wheezes. She has no rales.  Abdominal: Soft. There is no tenderness.  Musculoskeletal: She exhibits no tenderness.  Neurological: She is alert and oriented to person, place, and time.  Cranial Nerves II-XII  grossly intact  Skin: Skin is warm and dry. No rash noted.  Psychiatric: She has a normal mood and affect. Her behavior is normal.  Nursing note and vitals reviewed.   ED Course  Procedures (including critical care time) Labs Review Labs Reviewed  BASIC METABOLIC PANEL - Abnormal; Notable for the following:    Glucose, Bld 153 (*)    GFR calc non Af Amer 58 (*)    GFR calc Af Amer 67 (*)    All other components within normal limits  CBC  I-STAT TROPOININ, ED  I-STAT TROPOININ, ED    Imaging Review Dg Chest 2 View  10/18/2014   CLINICAL DATA:  Mid chest pain with shortness of breath intermittently for 4 days. History of hypertension and diabetes. Initial encounter.  EXAM: CHEST  2 VIEW  COMPARISON:  04/29/2012.  FINDINGS: The heart size and mediastinal contours are normal. The lungs are clear. There is no pleural effusion or pneumothorax. No acute osseous findings are identified. Probable cholecystectomy clips noted.  IMPRESSION: No active cardiopulmonary process.   Electronically Signed   By: Camie Patience M.D.   On: 10/18/2014 22:00     EKG Interpretation   Date/Time:  Tuesday October 19 2014 00:31:51 EST Ventricular Rate:  77 PR Interval:  192 QRS Duration: 94 QT Interval:  401 QTC Calculation: 454 R Axis:   50 Text Interpretation:  Sinus rhythm pvc have resolved Confirmed by OTTER   MD, OLGA ([id number]) on 10/19/2014 1:22:05 AM      MDM  Vitals stable - WNL -afebrile Pt resting comfortably in ED, NAD, Denies any discomfort in ED. PE--not concerning for other acute or emergent pathology Labwork--delta troponins negative, EKG reassuring. Imaging--CXR shows no acute cardiopulmonary pathology.  Clinical picture not consistent with ACS, dissection, PE, PTX, esophogeal rupture. Discomfort seems to be directly correlated with nightmares and appears to be anxiety related. Per TTS, patient is psychiatrically cleared to follow-up with her doctors.  I discussed all relevant lab  findings and imaging results with pt and they verbalized understanding. Discussed f/u with PCP within 48 hrs and return precautions, pt very amenable to plan.   Prior to patient discharge, I discussed and reviewed this case with Dr. Regenia Skeeter, who also saw and evaluated the patient.   Final diagnoses:  Chest discomfort       Viona Gilmore [place], PA-C 10/19/14 [place], MD 10/23/14 [phone]

## 2014-10-18 NOTE — ED Notes (Signed)
Patient assisted to restroom, no other needs voiced at this time

## 2014-10-18 NOTE — ED Notes (Addendum)
Patient arrives via EMS from group home [facility]) with complaints of chest pain related to anxiety.  She reports she has been waking up with chest pain following bad dreams.  She reported to EMS, "The devil makes me feel this way sometimes."

## 2014-10-19 DIAGNOSIS — R0789 Other chest pain: Secondary | ICD-10-CM | POA: Diagnosis not present

## 2014-10-19 LAB — I-STAT TROPONIN, ED: Troponin i, poc: 0 ng/mL (ref 0.00–0.08)

## 2014-10-19 NOTE — BH Assessment (Signed)
Spoke with Roselyn Meier, PA-C pt is awaiting one more troponin level to be medically cleared. Pt presents with anxiety. Has long standing hx of psychosis, and reported last week voices told her to kill her niece. She has not acted on these hallucinations and denies any at present.    Assessment to commence shortly.    Lear Ng, [facility] Triage Specialist 10/19/2014 12:03 AM

## 2014-10-19 NOTE — BH Assessment (Addendum)
Tele Assessment Note   [name] is an 59 y.o. female. Presenting to ED with chest pain. Pt reports she believes it is related to anxiety. Reports onset of panic attacks beginning last Thursday. Pt reports long standing hx of schizoaffective disorder with command hallucinations of the devil telling her to hurt self or other. Pt reports several days ago she heard devil telling her to hurt niece's little girl. Pt reports she has never acted on command to hurt others and has not acted on command to hurt self in at least a year. Pt reports she speaks with care home workers, or counselors when she needs to to help her cope with command hallucinations. Pt denies any use of etoh or substances for at least 15 years.   Pt reports eating more recently, sleeping less, and isolating in her room. She denies current SI/HI. Pt reports racing thoughts at times, but is unsure if she has had manic or hypomanic sx. She reports frequent anxiety, worrying about self and others, and sometimes somatic complaints. She denies hx of abuse, denies sx of PTSD. She reports she counts license plate numbers when driving and reports she gets distracted but does not appear to meet full OCD criteria.   Pt reports family hx is positive for bipolar. She feels able to contract for safety and is able to voice what to do if she has command hallucinations. Pt is accompanied by care home worker who support what pt reported, and noted pt has been able to avoid inpt for greater than a year, and is medication compliant.   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 Diabetes mellitus   . GERD (gastroesophageal reflux disease)   . Borderline personality disorder   . Schizoaffective disorder   . Depression   . Hyperlipidemia   . Chronic back pain   . Hypothyroidism   . Schizophrenia   . Schizoaffective disorder  04/29/2012  . HTN (hypertension) 04/29/2012  . Bipolar affective disorder 04/29/2012  . Diabetes mellitus 04/29/2012   Axis IV: other psychosocial or environmental problems Axis V: 41-50 serious symptoms  Past Medical History:  Past Medical History  Diagnosis Date  . Diabetes mellitus   . GERD (gastroesophageal reflux disease)   . Borderline personality disorder   . Schizoaffective disorder   . Depression   . Hyperlipidemia   . Chronic back pain   . Hypothyroidism   . Schizophrenia   . Schizoaffective disorder 04/29/2012  . HTN (hypertension) 04/29/2012  . Bipolar affective disorder 04/29/2012  . Diabetes mellitus 04/29/2012    Past Surgical History  Procedure Laterality Date  . Appendectomy    . Knee surgery      right  . Cholecystectomy      Family History: No family history on file.  Social History:  reports that she has quit smoking. She has never used smokeless tobacco. She reports that she does not drink alcohol or use illicit drugs.  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 Patient is postmenopausal.  General Assessment Data Location of Assessment: [facility] Is this a Tele or Face-to-Face Assessment?: Face-to-Face Is this an Initial Assessment or a Re-assessment for this encounter?: Initial Assessment Living Arrangements: Other (Comment) ([place]) Can pt return to current living arrangement?: Yes Admission Status: Voluntary Is patient capable of signing voluntary admission?: Yes Transfer from: Group Home Referral Source: Self/Family/Friend     [facility] Living Arrangements: Other (Comment) ([place]) Name of Psychiatrist: [facility] Name of Therapist: [facility]  Education Status Is patient currently in school?: No Current Grade: NA Highest grade of school patient has completed: some college Name of school: NA Contact person: NA  Risk to self with the past 6 months Suicidal Ideation: No-Not Currently/Within Last 6 Months Suicidal Intent: No Is patient at risk for suicide?: Yes Suicidal Plan?: No Access to Means: No What has been your use of drugs/alcohol within the last 12 months?: Pt has had no use for the past 15 years Previous Attempts/Gestures: Yes How many times?:  (multiple ) Triggers for Past Attempts: Hallucinations Intentional Self Injurious Behavior: Cutting, Burning (none in past year or more per pt ) Comment - Self Injurious Behavior: reports has cut and burned in the past none in year or more Family Suicide History: No Recent stressful life event(s): Other (Comment) (reports commad AVH, and chest pains, recent onset panic ) Persecutory voices/beliefs?: No Depression: Yes Depression Symptoms: Isolating, Fatigue (sleep problems) Substance abuse history and/or treatment for substance abuse?: No Suicide prevention information given to non-admitted patients: Yes  Risk to Others within the past 6 months Homicidal Ideation: No-Not Currently/Within Last 6 Months Thoughts of Harm to Others: No-Not Currently Present/Within Last 6 Months Current Homicidal Intent: No Current Homicidal Plan: No Access to Homicidal Means: No Identified Victim: reports most recently said devil told her to hurt niece's little girl, did not act History of harm to others?: No Assessment of Violence: None  Noted Violent Behavior Description: none Does patient have access to weapons?: No Criminal Charges Pending?: No Does patient have a court date: No  Psychosis Hallucinations: Auditory, With command (most recently a couple of days ago ) Delusions: None noted  Mental Status Report Appear/Hygiene: Unremarkable, In scrubs Eye Contact: Good Motor Activity: Unremarkable Speech: Logical/coherent Level of Consciousness: Alert Mood: Euthymic Affect: Constricted Anxiety Level: Panic Attacks Panic attack frequency: daily since last Thursday  Most recent panic attack: today  Thought Processes: Coherent, Relevant Judgement: Unimpaired Orientation: Person, Place, Time, Situation Obsessive Compulsive Thoughts/Behaviors: Minimal (counting while driving, gets distracted)  Cognitive Functioning Concentration: Decreased Memory: Recent Intact, Recent Impaired (reports some problems with remote recall ) IQ: Average Insight: Good Impulse Control: Good Appetite: Good Weight Loss: 0 Weight Gain: 0 Sleep: Decreased Total Hours of Sleep: 5 (trouble staying asleep ) Vegetative Symptoms: None  ADLScreening [facility] Assessment Services) Patient's cognitive ability adequate to safely complete daily activities?: Yes Patient able to express need for assistance with ADLs?: Yes Independently performs ADLs?: Yes (appropriate for developmental age)  Prior Inpatient Therapy Prior Inpatient Therapy: Yes Prior Therapy Dates: multiple Prior Therapy Facilty/Provider(s): [facility], [facility], [facility], [facility] Reason for Treatment: schizoaffective Disorder  Prior Outpatient Therapy Prior Outpatient Therapy: Yes Prior Therapy Dates: current Prior Therapy Facilty/Provider(s): [facility],  [facility], medication management, counseling  Reason for Treatment: schizoaffective disorder  ADL Screening (condition at time of admission) Patient's cognitive ability adequate to safely complete daily activities?: Yes Patient able to express need for  assistance with ADLs?: Yes Independently performs ADLs?: Yes (appropriate for developmental age)       Abuse/Neglect Assessment (Assessment to be complete while patient is alone) Physical Abuse: Denies Verbal Abuse: Denies Sexual Abuse: Denies Exploitation of patient/patient's resources: Denies Self-Neglect: Denies Values / Beliefs Cultural Requests During Hospitalization: None Spiritual Requests During Hospitalization: None   Advance Directives (For Healthcare) Does patient have an advance directive?: No Would patient like information on creating an advanced directive?: No - patient declined information    Additional Information 1:1 In Past 12 Months?: No CIRT Risk: No Elopement Risk: No Does patient have medical clearance?:  (pending troponin level )     Disposition:  Per Patriciaann Clan, PA pt does not meet inpt criteria and can follow up with OP providers. Informed Jaquita Folds, PA-C of recommendations and he is in agreement. Informed Pt and RN of plan.   Lear Ng, [facility] Triage Specialist 10/19/2014 12:43 AM  Disposition Initial Assessment Completed for this Encounter: Yes  Wyndi Northrup M 10/19/2014 12:42 AM

## 2014-10-19 NOTE — ED Notes (Signed)
Patient verbalizes understanding of discharge instructions, home care and follow up care if needed. Patient ambulatory out of department at this time with family.

## 2014-10-19 NOTE — Discharge Instructions (Signed)
Chest Pain (Nonspecific) It is often hard to give a diagnosis for the cause of chest pain. There is always a chance that your pain could be related to something serious, such as a heart attack or a blood clot in the lungs. You need to follow up with your doctor. HOME CARE  If antibiotic medicine was given, take it as directed by your doctor. Finish the medicine even if you start to feel better.  For the next few days, avoid activities that bring on chest pain. Continue physical activities as told by your doctor.  Do not use any tobacco products. This includes cigarettes, chewing tobacco, and e-cigarettes.  Avoid drinking alcohol.  Only take medicine as told by your doctor.  Follow your doctor's suggestions for more testing if your chest pain does not go away.  Keep all doctor visits you made. GET HELP IF:  Your chest pain does not go away, even after treatment.  You have a rash with blisters on your chest.  You have a fever. GET HELP RIGHT AWAY IF:   You have more pain or pain that spreads to your arm, neck, jaw, back, or belly (abdomen).  You have shortness of breath.  You cough more than usual or cough up blood.  You have very bad back or belly pain.  You feel sick to your stomach (nauseous) or throw up (vomit).  You have very bad weakness.  You pass out (faint).  You have chills. This is an emergency. Do not wait to see if the problems will go away. Call your local emergency services (911 in [place] Do not drive yourself to the hospital. MAKE SURE YOU:   Understand these instructions.  Will watch your condition.  Will get help right away if you are not doing well or get worse. Document Released: 02/20/2008 Document Revised: 09/08/2013 Document Reviewed: 02/20/2008 [facility] Patient Information 2015 [place], [place]. This information is not intended to replace advice given to you by your health care provider. Make sure you discuss any questions you have with your  health care provider.   Your evaluated in the ED today for your chest discomfort. There does not appear to be an emergent cause for your symptoms at this time. It is important for you to follow-up with your primary care for further evaluation and management of your symptoms. Return to ED for new or worsening symptoms.

## 2014-11-26 ENCOUNTER — Other Ambulatory Visit (HOSPITAL_COMMUNITY): Payer: Self-pay | Admitting: Psychiatry

## 2014-11-26 NOTE — Telephone Encounter (Signed)
Patient unknown to Dr. Adele Schilder and refills declined per approval of Dr. Adele Schilder.  Patient not open to outpatient practice.

## 2014-11-29 ENCOUNTER — Other Ambulatory Visit (HOSPITAL_COMMUNITY): Payer: Self-pay | Admitting: Psychiatry

## 2014-11-29 NOTE — Telephone Encounter (Signed)
Telephone call with Monica Martinez, pharmacist at [facility] to inform patient is not an open patient at [facility] and not under Dr. Marguerite Olea care so refills of medications denied at this time.

## 2014-12-28 ENCOUNTER — Ambulatory Visit (INDEPENDENT_AMBULATORY_CARE_PROVIDER_SITE_OTHER): Payer: Medicare Other

## 2014-12-28 ENCOUNTER — Encounter: Payer: Self-pay | Admitting: Podiatry

## 2014-12-28 ENCOUNTER — Ambulatory Visit (INDEPENDENT_AMBULATORY_CARE_PROVIDER_SITE_OTHER): Payer: Medicare Other | Admitting: Podiatry

## 2014-12-28 VITALS — BP 100/59 | HR 88 | Resp 16

## 2014-12-28 DIAGNOSIS — M79673 Pain in unspecified foot: Secondary | ICD-10-CM

## 2014-12-28 DIAGNOSIS — B351 Tinea unguium: Secondary | ICD-10-CM | POA: Diagnosis not present

## 2014-12-28 DIAGNOSIS — M722 Plantar fascial fibromatosis: Secondary | ICD-10-CM | POA: Diagnosis not present

## 2014-12-28 MED ORDER — MELOXICAM 15 MG PO TABS: 15.0000 mg | ORAL_TABLET | Freq: Every day | ORAL | Status: DC

## 2014-12-28 NOTE — Patient Instructions (Signed)
Plantar Fasciitis (Heel Spur Syndrome) with Rehab The plantar fascia is a fibrous, ligament-like, soft-tissue structure that spans the bottom of the foot. Plantar fasciitis is a condition that causes pain in the foot due to inflammation of the tissue. SYMPTOMS   Pain and tenderness on the underneath side of the foot.  Pain that worsens with standing or walking. CAUSES  Plantar fasciitis is caused by irritation and injury to the plantar fascia on the underneath side of the foot. Common mechanisms of injury include:  Direct trauma to bottom of the foot.  Damage to a small nerve that runs under the foot where the main fascia attaches to the heel bone.  Stress placed on the plantar fascia due to bone spurs. RISK INCREASES WITH:   Activities that place stress on the plantar fascia (running, jumping, pivoting, or cutting).  Poor strength and flexibility.  Improperly fitted shoes.  Tight calf muscles.  Flat feet.  Failure to warm-up properly before activity.  Obesity. PREVENTION  Warm up and stretch properly before activity.  Allow for adequate recovery between workouts.  Maintain physical fitness:  Strength, flexibility, and endurance.  Cardiovascular fitness.  Maintain a health body weight.  Avoid stress on the plantar fascia.  Wear properly fitted shoes, including arch supports for individuals who have flat feet.  PROGNOSIS  If treated properly, then the symptoms of plantar fasciitis usually resolve without surgery. However, occasionally surgery is necessary.  RELATED COMPLICATIONS   Recurrent symptoms that may result in a chronic condition.  Problems of the lower back that are caused by compensating for the injury, such as limping.  Pain or weakness of the foot during push-off following surgery.  Chronic inflammation, scarring, and partial or complete fascia tear, occurring more often from repeated injections.  TREATMENT  Treatment initially involves the  use of ice and medication to help reduce pain and inflammation. The use of strengthening and stretching exercises may help reduce pain with activity, especially stretches of the Achilles tendon. These exercises may be performed at home or with a therapist. Your caregiver may recommend that you use heel cups of arch supports to help reduce stress on the plantar fascia. Occasionally, corticosteroid injections are given to reduce inflammation. If symptoms persist for greater than 6 months despite non-surgical (conservative), then surgery may be recommended.   MEDICATION   If pain medication is necessary, then nonsteroidal anti-inflammatory medications, such as aspirin and ibuprofen, or other minor pain relievers, such as acetaminophen, are often recommended.  Do not take pain medication within 7 days before surgery.  Prescription pain relievers may be given if deemed necessary by your caregiver. Use only as directed and only as much as you need.  Corticosteroid injections may be given by your caregiver. These injections should be reserved for the most serious cases, because they may only be given a certain number of times.  HEAT AND COLD  Cold treatment (icing) relieves pain and reduces inflammation. Cold treatment should be applied for 10 to 15 minutes every 2 to 3 hours for inflammation and pain and immediately after any activity that aggravates your symptoms. Use ice packs or massage the area with a piece of ice (ice massage).  Heat treatment may be used prior to performing the stretching and strengthening activities prescribed by your caregiver, physical therapist, or athletic trainer. Use a heat pack or soak the injury in warm water.  SEEK IMMEDIATE MEDICAL CARE IF:  Treatment seems to offer no benefit, or the condition worsens.  Any medications   produce adverse side effects.  EXERCISES- RANGE OF MOTION (ROM) AND STRETCHING EXERCISES - Plantar Fasciitis (Heel Spur Syndrome) These exercises  may help you when beginning to rehabilitate your injury. Your symptoms may resolve with or without further involvement from your physician, physical therapist or athletic trainer. While completing these exercises, remember:   Restoring tissue flexibility helps normal motion to return to the joints. This allows healthier, less painful movement and activity.  An effective stretch should be held for at least 30 seconds.  A stretch should never be painful. You should only feel a gentle lengthening or release in the stretched tissue.  RANGE OF MOTION - Toe Extension, Flexion  Sit with your right / left leg crossed over your opposite knee.  Grasp your toes and gently pull them back toward the top of your foot. You should feel a stretch on the bottom of your toes and/or foot.  Hold this stretch for 10 seconds.  Now, gently pull your toes toward the bottom of your foot. You should feel a stretch on the top of your toes and or foot.  Hold this stretch for 10 seconds. Repeat  times. Complete this stretch 3 times per day.   RANGE OF MOTION - Ankle Dorsiflexion, Active Assisted  Remove shoes and sit on a chair that is preferably not on a carpeted surface.  Place right / left foot under knee. Extend your opposite leg for support.  Keeping your heel down, slide your right / left foot back toward the chair until you feel a stretch at your ankle or calf. If you do not feel a stretch, slide your bottom forward to the edge of the chair, while still keeping your heel down.  Hold this stretch for 10 seconds. Repeat 3 times. Complete this stretch 2 times per day.   STRETCH  Gastroc, Standing  Place hands on wall.  Extend right / left leg, keeping the front knee somewhat bent.  Slightly point your toes inward on your back foot.  Keeping your right / left heel on the floor and your knee straight, shift your weight toward the wall, not allowing your back to arch.  You should feel a gentle stretch  in the right / left calf. Hold this position for 10 seconds. Repeat 3 times. Complete this stretch 2 times per day.  STRETCH  Soleus, Standing  Place hands on wall.  Extend right / left leg, keeping the other knee somewhat bent.  Slightly point your toes inward on your back foot.  Keep your right / left heel on the floor, bend your back knee, and slightly shift your weight over the back leg so that you feel a gentle stretch deep in your back calf.  Hold this position for 10 seconds. Repeat 3 times. Complete this stretch 2 times per day.  STRETCH  Gastrocsoleus, Standing  Note: This exercise can place a lot of stress on your foot and ankle. Please complete this exercise only if specifically instructed by your caregiver.   Place the ball of your right / left foot on a step, keeping your other foot firmly on the same step.  Hold on to the wall or a rail for balance.  Slowly lift your other foot, allowing your body weight to press your heel down over the edge of the step.  You should feel a stretch in your right / left calf.  Hold this position for 10 seconds.  Repeat this exercise with a slight bend in your right /   left knee. Repeat 3 times. Complete this stretch 2 times per day.   STRENGTHENING EXERCISES - Plantar Fasciitis (Heel Spur Syndrome)  These exercises may help you when beginning to rehabilitate your injury. They may resolve your symptoms with or without further involvement from your physician, physical therapist or athletic trainer. While completing these exercises, remember:   Muscles can gain both the endurance and the strength needed for everyday activities through controlled exercises.  Complete these exercises as instructed by your physician, physical therapist or athletic trainer. Progress the resistance and repetitions only as guided.  STRENGTH - Towel Curls  Sit in a chair positioned on a non-carpeted surface.  Place your foot on a towel, keeping your heel  on the floor.  Pull the towel toward your heel by only curling your toes. Keep your heel on the floor. Repeat 3 times. Complete this exercise 2 times per day.  STRENGTH - Ankle Inversion  Secure one end of a rubber exercise band/tubing to a fixed object (table, pole). Loop the other end around your foot just before your toes.  Place your fists between your knees. This will focus your strengthening at your ankle.  Slowly, pull your big toe up and in, making sure the band/tubing is positioned to resist the entire motion.  Hold this position for 10 seconds.  Have your muscles resist the band/tubing as it slowly pulls your foot back to the starting position. Repeat 3 times. Complete this exercises 2 times per day.  Document Released: 09/03/2005 Document Revised: 11/26/2011 Document Reviewed: 12/16/2008 [employer] Patient Information 2014 [employer]. Diabetes and Foot Care Diabetes may cause you to have problems because of poor blood supply (circulation) to your feet and legs. This may cause the skin on your feet to become thinner, break easier, and heal more slowly. Your skin may become dry, and the skin may peel and crack. You may also have nerve damage in your legs and feet causing decreased feeling in them. You may not notice minor injuries to your feet that could lead to infections or more serious problems. Taking care of your feet is one of the most important things you can do for yourself.  HOME CARE INSTRUCTIONS  Wear shoes at all times, even in the house. Do not go barefoot. Bare feet are easily injured.  Check your feet daily for blisters, cuts, and redness. If you cannot see the bottom of your feet, use a mirror or ask someone for help.  Wash your feet with warm water (do not use hot water) and mild soap. Then pat your feet and the areas between your toes until they are completely dry. Do not soak your feet as this can dry your skin.  Apply a moisturizing lotion or petroleum  jelly (that does not contain alcohol and is unscented) to the skin on your feet and to dry, brittle toenails. Do not apply lotion between your toes.  Trim your toenails straight across. Do not dig under them or around the cuticle. File the edges of your nails with an emery board or nail file.  Do not cut corns or calluses or try to remove them with medicine.  Wear clean socks or stockings every day. Make sure they are not too tight. Do not wear knee-high stockings since they may decrease blood flow to your legs.  Wear shoes that fit properly and have enough cushioning. To break in new shoes, wear them for just a few hours a day. This prevents you from injuring your   feet. Always look in your shoes before you put them on to be sure there are no objects inside.  Do not cross your legs. This may decrease the blood flow to your feet.  If you find a minor scrape, cut, or break in the skin on your feet, keep it and the skin around it clean and dry. These areas may be cleansed with mild soap and water. Do not cleanse the area with peroxide, alcohol, or iodine.  When you remove an adhesive bandage, be sure not to damage the skin around it.  If you have a wound, look at it several times a day to make sure it is healing.  Do not use heating pads or hot water bottles. They may burn your skin. If you have lost feeling in your feet or legs, you may not know it is happening until it is too late.  Make sure your health care provider performs a complete foot exam at least annually or more often if you have foot problems. Report any cuts, sores, or bruises to your health care provider immediately. SEEK MEDICAL CARE IF:   You have an injury that is not healing.  You have cuts or breaks in the skin.  You have an ingrown nail.  You notice redness on your legs or feet.  You feel burning or tingling in your legs or feet.  You have pain or cramps in your legs and feet.  Your legs or feet are numb.  Your  feet always feel cold. SEEK IMMEDIATE MEDICAL CARE IF:   There is increasing redness, swelling, or pain in or around a wound.  There is a red line that goes up your leg.  Pus is coming from a wound.  You develop a fever or as directed by your health care provider.  You notice a bad smell coming from an ulcer or wound. Document Released: 08/31/2000 Document Revised: 05/06/2013 Document Reviewed: 02/10/2013 [employer] Patient Information 2015 [employer]. This information is not intended to replace advice given to you by your health care provider. Make sure you discuss any questions you have with your health care provider.  

## 2014-12-28 NOTE — Progress Notes (Signed)
She presents today with chief complaint of pain to her right foot and painfully elongated toenails. I have reviewed her past medical history medications and allergies. She denies changes in her past medical history medications allergies or social history.  Objective: Vital signs are stable she is alert and oriented 3. Pulses are strongly palpable bilaterally neurologic sensorium is intact per Semmes-Weinstein monofilament. Deep tendon reflexes are intact bilateral and muscle strength is intact bilateral. Her toenails are thick yellow dystrophic onychomycotic. She has pain on palpation medial calcaneal tubercles of the bilateral heels.  Assessment: Pain and limb secondary to onychomycosis. Plantar fasciitis right greater than left.  Plan: Debridement nails 1 through 5 bilateral is a covered service secondary to pain. Injected the right heel today with Kenalog and local anesthetic. Placed her in a plantar fascial brace. We discussed appropriate shoe gear stretching exercises ice therapy issue gear modifications. I will follow up with her in 1 month.

## 2014-12-29 ENCOUNTER — Telehealth: Payer: Self-pay | Admitting: *Deleted

## 2014-12-29 ENCOUNTER — Other Ambulatory Visit (HOSPITAL_COMMUNITY): Payer: Self-pay | Admitting: Psychiatry

## 2014-12-29 NOTE — Telephone Encounter (Addendum)
Caller states pt did not inform Dr. Milinda Pointer she was taking EC aspirin 4 times a day, does he want her to take the medication he prescribed yesterday.  I reviewed the medication list for this pt and aspirin 81 mg was listed, I asked Latoria who had prescribed the aspirin and she said Dr. Welton Flakes and I asked what for, she said her foot.  I told her our record showed pt to be on Aspirin 81 mg daily, she said no the mg was 325mg  and 4 times a day.  I told her to stop the aspirin 325mg  if it was not for the heart.  I asked again if Aspirin was for her heart and Erskine Squibb stated, "I told you it was for her foot 4 times."  I told her to use the 12/28/2014 Meloxicam prescribed by Dr. Milinda Pointer.  Erskine Squibb stated she needed an order, I got the fax number [phone].  Erskine Squibb called back and stated she would fax a form that needed Dr. Stephenie Acres signature to stop the Aspirin 325mg , I told her okay, I'd have him complete it when he received it.  Faxed order request not received from [facility] on 12/29/2014.  I faxed orders from Dr. Milinda Pointer stopping the Aspirin 325mg  4 times a day, and take Mobic as ordered on 12/28/2014, faxed to [phone].

## 2014-12-31 ENCOUNTER — Other Ambulatory Visit (HOSPITAL_COMMUNITY): Payer: Self-pay | Admitting: Psychiatry

## 2014-12-31 ENCOUNTER — Telehealth: Payer: Self-pay | Admitting: *Deleted

## 2014-12-31 NOTE — Telephone Encounter (Signed)
Refill request for Cogentin declined as patient is not under Dr. Marguerite Olea care.

## 2014-12-31 NOTE — Telephone Encounter (Signed)
Entered in error

## 2015-01-03 NOTE — Telephone Encounter (Signed)
Patient is not open in our outpatient practice.  Was only seen once by Dr. Adele Schilder in 2014 as a consult.  Refill requests declined at this time.

## 2015-01-25 ENCOUNTER — Ambulatory Visit (INDEPENDENT_AMBULATORY_CARE_PROVIDER_SITE_OTHER): Payer: Medicare Other | Admitting: Podiatry

## 2015-01-25 ENCOUNTER — Encounter: Payer: Self-pay | Admitting: Podiatry

## 2015-01-25 VITALS — BP 124/71 | HR 80 | Resp 12

## 2015-01-25 DIAGNOSIS — M722 Plantar fascial fibromatosis: Secondary | ICD-10-CM

## 2015-01-25 NOTE — Progress Notes (Signed)
She presents today for follow-up of plantar fasciitis. She states the right one still little tender at the left one is doing much better.  Objective: Vital signs are stable she is alert and oriented 3. Pulses are palpable bilateral. Pain on palpation medial calcaneal tubercle of the right heel. Minimally so on the left heel.  Assessment: Plantar fasciitis bilateral.  Plan: We injected the foot today with Kenalog and local anesthetic continue all conservative therapies follow up with me in 1 month.

## 2015-04-12 ENCOUNTER — Ambulatory Visit: Payer: Medicare Other | Admitting: Podiatry

## 2015-04-26 ENCOUNTER — Telehealth: Payer: Self-pay | Admitting: *Deleted

## 2015-04-26 ENCOUNTER — Ambulatory Visit (INDEPENDENT_AMBULATORY_CARE_PROVIDER_SITE_OTHER): Payer: Medicare Other | Admitting: Podiatry

## 2015-04-26 ENCOUNTER — Encounter: Payer: Self-pay | Admitting: Podiatry

## 2015-04-26 VITALS — BP 95/58 | HR 101 | Resp 16

## 2015-04-26 DIAGNOSIS — B351 Tinea unguium: Secondary | ICD-10-CM | POA: Diagnosis not present

## 2015-04-26 DIAGNOSIS — M79673 Pain in unspecified foot: Secondary | ICD-10-CM

## 2015-04-26 DIAGNOSIS — M722 Plantar fascial fibromatosis: Secondary | ICD-10-CM

## 2015-04-26 MED ORDER — MELOXICAM 15 MG PO TABS: 15.0000 mg | ORAL_TABLET | Freq: Every day | ORAL | Status: DC

## 2015-04-26 NOTE — Telephone Encounter (Signed)
Fax request for Meloxicam 15mg  #30, Dr. Milinda Pointer okayed +3 refills.

## 2015-04-27 ENCOUNTER — Encounter: Payer: Self-pay | Admitting: *Deleted

## 2015-04-27 NOTE — Progress Notes (Signed)
She presents today for follow-up of her routine nail debridement. She states that her plantar fasciitis was doing very well but is starting to come back and she'll like to have another injection if possible to help prevent it from getting as bad as it was.  Objective: Vital signs are stable she is alert and oriented 3. Pulses are palpable bilateral. Her nails are thick yellow dystrophic onychomycotic brittle and painful palpation as well as debridement. She has pain on palpation medial calcaneal tubercle of the right heel. Pulses are palpable without calf pain bilateral.  Assessment: Plantar fasciitis right. Onychomycosis with pain in limb bilateral.  Plan: Debridement of nails 1 through 5 bilateral covered service secondary to pain.

## 2015-05-23 ENCOUNTER — Other Ambulatory Visit (HOSPITAL_COMMUNITY): Payer: Self-pay | Admitting: Psychiatry

## 2015-07-21 ENCOUNTER — Other Ambulatory Visit: Payer: Self-pay | Admitting: Podiatry

## 2015-07-26 ENCOUNTER — Ambulatory Visit (INDEPENDENT_AMBULATORY_CARE_PROVIDER_SITE_OTHER): Payer: Medicare Other | Admitting: Podiatry

## 2015-07-26 ENCOUNTER — Encounter: Payer: Self-pay | Admitting: Podiatry

## 2015-07-26 VITALS — BP 103/72 | HR 92 | Resp 16

## 2015-07-26 DIAGNOSIS — B351 Tinea unguium: Secondary | ICD-10-CM | POA: Diagnosis not present

## 2015-07-26 DIAGNOSIS — M722 Plantar fascial fibromatosis: Secondary | ICD-10-CM

## 2015-07-26 DIAGNOSIS — M79671 Pain in right foot: Secondary | ICD-10-CM | POA: Diagnosis not present

## 2015-07-26 NOTE — Progress Notes (Signed)
She presents today for follow-up of plantar fasciitis left foot which she states doing much better. She states that she does not need an injection. She is complaining about thick mycotic nails as well.  Objective: Vital signs are stable she is alert and oriented 3 pulses are strongly palpable. Nails are thick yellow dystrophic onychomycotic and painful palpation. She has mild tenderness on palpation medial calcaneal tubercle of the left heel however seems to be getting much better with her conservative therapies.  Assessment: Pain in limb secondary to onychomycosis and plantar fasciitis left.  Plan: Discussed etiology pathology conservative versus surgical therapies. Debrided nails 1 through 5 bilateral covered service secondary to pain. Follow-up with her in 3 months.

## 2015-10-27 ENCOUNTER — Ambulatory Visit: Payer: Medicare Other | Admitting: Podiatry

## 2015-11-01 ENCOUNTER — Ambulatory Visit: Payer: Medicare Other | Admitting: Podiatry

## 2015-11-29 ENCOUNTER — Encounter: Payer: Self-pay | Admitting: Podiatry

## 2015-11-29 ENCOUNTER — Ambulatory Visit (INDEPENDENT_AMBULATORY_CARE_PROVIDER_SITE_OTHER): Payer: Medicare Other | Admitting: Podiatry

## 2015-11-29 DIAGNOSIS — M79675 Pain in left toe(s): Secondary | ICD-10-CM

## 2015-11-29 DIAGNOSIS — M79674 Pain in right toe(s): Secondary | ICD-10-CM

## 2015-11-29 DIAGNOSIS — B351 Tinea unguium: Secondary | ICD-10-CM | POA: Diagnosis not present

## 2015-11-29 NOTE — Patient Instructions (Signed)
Return every 3 months for debridement of toenails on the right and left feet [name] DPM  Diabetes and Foot Care Diabetes may cause you to have problems because of poor blood supply (circulation) to your feet and legs. This may cause the skin on your feet to become thinner, break easier, and heal more slowly. Your skin may become dry, and the skin may peel and crack. You may also have nerve damage in your legs and feet causing decreased feeling in them. You may not notice minor injuries to your feet that could lead to infections or more serious problems. Taking care of your feet is one of the most important things you can do for yourself.  HOME CARE INSTRUCTIONS  Wear shoes at all times, even in the house. Do not go barefoot. Bare feet are easily injured.  Check your feet daily for blisters, cuts, and redness. If you cannot see the bottom of your feet, use a mirror or ask someone for help.  Wash your feet with warm water (do not use hot water) and mild soap. Then pat your feet and the areas between your toes until they are completely dry. Do not soak your feet as this can dry your skin.  Apply a moisturizing lotion or petroleum jelly (that does not contain alcohol and is unscented) to the skin on your feet and to dry, brittle toenails. Do not apply lotion between your toes.  Trim your toenails straight across. Do not dig under them or around the cuticle. File the edges of your nails with an emery board or nail file.  Do not cut corns or calluses or try to remove them with medicine.  Wear clean socks or stockings every day. Make sure they are not too tight. Do not wear knee-high stockings since they may decrease blood flow to your legs.  Wear shoes that fit properly and have enough cushioning. To break in new shoes, wear them for just a few hours a day. This prevents you from injuring your feet. Always look in your shoes before you put them on to be sure there are no objects  inside.  Do not cross your legs. This may decrease the blood flow to your feet.  If you find a minor scrape, cut, or break in the skin on your feet, keep it and the skin around it clean and dry. These areas may be cleansed with mild soap and water. Do not cleanse the area with peroxide, alcohol, or iodine.  When you remove an adhesive bandage, be sure not to damage the skin around it.  If you have a wound, look at it several times a day to make sure it is healing.  Do not use heating pads or hot water bottles. They may burn your skin. If you have lost feeling in your feet or legs, you may not know it is happening until it is too late.  Make sure your health care provider performs a complete foot exam at least annually or more often if you have foot problems. Report any cuts, sores, or bruises to your health care provider immediately. SEEK MEDICAL CARE IF:   You have an injury that is not healing.  You have cuts or breaks in the skin.  You have an ingrown nail.  You notice redness on your legs or feet.  You feel burning or tingling in your legs or feet.  You have pain or cramps in your legs and feet.  Your legs or feet  are numb.  Your feet always feel cold. SEEK IMMEDIATE MEDICAL CARE IF:   There is increasing redness, swelling, or pain in or around a wound.  There is a red line that goes up your leg.  Pus is coming from a wound.  You develop a fever or as directed by your health care provider.  You notice a bad smell coming from an ulcer or wound.   This information is not intended to replace advice given to you by your health care provider. Make sure you discuss any questions you have with your health care provider.   Document Released: 08/31/2000 Document Revised: 05/06/2013 Document Reviewed: 02/10/2013 Elsevier Interactive Patient Education [employer].

## 2015-11-29 NOTE — Progress Notes (Signed)
Patient ID: [name], female   DOB: 09/17/1956, 60 y.o.   MRN: [id number]  Subjective: This patient presents today complaining of uncomfortable toenails walking wearing shoes and she request toenail debridement. 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

## 2016-02-08 ENCOUNTER — Ambulatory Visit: Payer: Medicare Other | Admitting: Podiatry

## 2016-02-22 ENCOUNTER — Ambulatory Visit (INDEPENDENT_AMBULATORY_CARE_PROVIDER_SITE_OTHER): Payer: Medicare Other | Admitting: Podiatry

## 2016-02-22 ENCOUNTER — Encounter: Payer: Self-pay | Admitting: Podiatry

## 2016-02-22 DIAGNOSIS — M79674 Pain in right toe(s): Secondary | ICD-10-CM

## 2016-02-22 DIAGNOSIS — M79675 Pain in left toe(s): Secondary | ICD-10-CM | POA: Diagnosis not present

## 2016-02-22 DIAGNOSIS — B351 Tinea unguium: Secondary | ICD-10-CM

## 2016-02-22 NOTE — Progress Notes (Signed)
Patient ID: [name], female   DOB: Jun 14, 1956, 60 y.o.   MRN: [id number]  Subjective: This patient presents today complaining of uncomfortable toenails walking wearing shoes and she request toenail debridement. 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

## 2016-02-22 NOTE — Patient Instructions (Signed)
Reschedule every 3 months for debridement of toenails on your right and left feet [name] DPM, [facility]   Diabetes and Foot Care Diabetes may cause you to have problems because of poor blood supply (circulation) to your feet and legs. This may cause the skin on your feet to become thinner, break easier, and heal more slowly. Your skin may become dry, and the skin may peel and crack. You may also have nerve damage in your legs and feet causing decreased feeling in them. You may not notice minor injuries to your feet that could lead to infections or more serious problems. Taking care of your feet is one of the most important things you can do for yourself.  HOME CARE INSTRUCTIONS  Wear shoes at all times, even in the house. Do not go barefoot. Bare feet are easily injured.  Check your feet daily for blisters, cuts, and redness. If you cannot see the bottom of your feet, use a mirror or ask someone for help.  Wash your feet with warm water (do not use hot water) and mild soap. Then pat your feet and the areas between your toes until they are completely dry. Do not soak your feet as this can dry your skin.  Apply a moisturizing lotion or petroleum jelly (that does not contain alcohol and is unscented) to the skin on your feet and to dry, brittle toenails. Do not apply lotion between your toes.  Trim your toenails straight across. Do not dig under them or around the cuticle. File the edges of your nails with an emery board or nail file.  Do not cut corns or calluses or try to remove them with medicine.  Wear clean socks or stockings every day. Make sure they are not too tight. Do not wear knee-high stockings since they may decrease blood flow to your legs.  Wear shoes that fit properly and have enough cushioning. To break in new shoes, wear them for just a few hours a day. This prevents you from injuring your feet. Always look in your shoes before you put them on to be sure there  are no objects inside.  Do not cross your legs. This may decrease the blood flow to your feet.  If you find a minor scrape, cut, or break in the skin on your feet, keep it and the skin around it clean and dry. These areas may be cleansed with mild soap and water. Do not cleanse the area with peroxide, alcohol, or iodine.  When you remove an adhesive bandage, be sure not to damage the skin around it.  If you have a wound, look at it several times a day to make sure it is healing.  Do not use heating pads or hot water bottles. They may burn your skin. If you have lost feeling in your feet or legs, you may not know it is happening until it is too late.  Make sure your health care provider performs a complete foot exam at least annually or more often if you have foot problems. Report any cuts, sores, or bruises to your health care provider immediately. SEEK MEDICAL CARE IF:   You have an injury that is not healing.  You have cuts or breaks in the skin.  You have an ingrown nail.  You notice redness on your legs or feet.  You feel burning or tingling in your legs or feet.  You have pain or cramps in your legs and feet.  Your legs or feet are numb.  Your feet always feel cold. SEEK IMMEDIATE MEDICAL CARE IF:   There is increasing redness, swelling, or pain in or around a wound.  There is a red line that goes up your leg.  Pus is coming from a wound.  You develop a fever or as directed by your health care provider.  You notice a bad smell coming from an ulcer or wound.   This information is not intended to replace advice given to you by your health care provider. Make sure you discuss any questions you have with your health care provider.   Document Released: 08/31/2000 Document Revised: 05/06/2013 Document Reviewed: 02/10/2013 Elsevier Interactive Patient Education [employer].

## 2016-02-23 ENCOUNTER — Other Ambulatory Visit: Payer: Self-pay | Admitting: Podiatry

## 2016-03-07 ENCOUNTER — Ambulatory Visit: Payer: Medicare Other | Admitting: Podiatry

## 2016-04-04 ENCOUNTER — Ambulatory Visit
Admission: RE | Admit: 2016-04-04 | Discharge: 2016-04-04 | Disposition: A | Discharge status: Home / Self Care | Payer: Medicare Other | Source: Ambulatory Visit | Attending: Family Medicine | Admitting: Family Medicine

## 2016-04-04 ENCOUNTER — Other Ambulatory Visit: Payer: Self-pay | Admitting: Family Medicine

## 2016-04-04 DIAGNOSIS — M545 Low back pain: Secondary | ICD-10-CM

## 2016-05-30 ENCOUNTER — Ambulatory Visit (INDEPENDENT_AMBULATORY_CARE_PROVIDER_SITE_OTHER): Payer: Medicare Other | Admitting: Podiatry

## 2016-05-30 ENCOUNTER — Encounter: Payer: Self-pay | Admitting: Podiatry

## 2016-05-30 ENCOUNTER — Ambulatory Visit: Payer: Medicare Other

## 2016-05-30 VITALS — BP 108/76 | HR 86 | Temp 98.4°F | Resp 18

## 2016-05-30 DIAGNOSIS — M79674 Pain in right toe(s): Secondary | ICD-10-CM

## 2016-05-30 DIAGNOSIS — M6588 Other synovitis and tenosynovitis, other site: Secondary | ICD-10-CM

## 2016-05-30 DIAGNOSIS — M79675 Pain in left toe(s): Secondary | ICD-10-CM | POA: Diagnosis not present

## 2016-05-30 DIAGNOSIS — B351 Tinea unguium: Secondary | ICD-10-CM | POA: Diagnosis not present

## 2016-05-30 DIAGNOSIS — R52 Pain, unspecified: Secondary | ICD-10-CM

## 2016-05-30 DIAGNOSIS — M7752 Other enthesopathy of left foot: Secondary | ICD-10-CM

## 2016-05-30 DIAGNOSIS — M216X2 Other acquired deformities of left foot: Secondary | ICD-10-CM

## 2016-05-30 NOTE — Progress Notes (Signed)
   Subjective:    Patient ID: [name], female    DOB: 04-03-1956, 60 y.o.   MRN: [id number]  HPI    This patient presents today for scheduled visit complaining of uncomfortable toenails walking wearing shoes and request toenail debridement. Also, patient is complaining of a five-day history of pain in the left foot upon weight-bearing in or around the left ankle area with standing and walking. Patient denies any direct injury to the area. She does recall that the left foot has appeared flatter than the right for some period of time.  Patient lives in a group home and patient's representative is present in the treatment room today with her    Review of Systems  All other systems reviewed and are negative.      Objective:   Physical Exam  BP 108/76 Pulse 86 Respiration 18 Temperature 98.4  Patient is responsive questioning and appears orientated 3  Vascular: No calf edema or calf tenderness bilaterally DP and PT pulses 2/4 bilaterally Capillary reflex immediate bilaterally  Neurological: Sensation to 10 g monofilament wire intact 5/5 bilaterally Vibratory sensation reactive bilaterally Ankle reflex equal and reactive bilaterally  Dermatological: No open skin lesions bilaterally The toenails are hypertrophic, elongated, deformed 6-10  Musculoskeletal: Weightbearing hyperpronation left Forefoot abducted on the rear foot left Patient not able to heel off left Weekly able to heel off right Palpable tenderness medial ankle proximal to the malleolar area over tendons       Assessment & Plan:   Assessment: Type II diabetic with satisfactory neurovascular status Symptomatic mycotic toenails 6-10 Hyperpronation left foot Medial ankle tendinopathy left could be associated with hyperpronation left  Plan: I reviewed the results of the exam with patient and caregiver present in the treatment room. I'm encouraging patient to wear sturdy athletic lace up style shoe  and return in the next 2 weeks for further evaluation see if the symptoms persist  The toenails 6-10 are debrided mechanically and leg without a bleeding  Reappoint 2 weeks for evaluation of hyperpronation left with medial ankle tendinopathy left

## 2016-05-30 NOTE — Patient Instructions (Addendum)
Today her complaining of a five-day history of tenderness in the left heel cords and top the left foot Examination today demonstrated excessive ingrowing of your left foot with some tenderness in the tendons around the inside of your left ankle Please wear in athletic lace up style shoe when standing walking and return within the next 2 weeks for further evaluation  [name] DPM [facility] 05/30/2016

## 2016-06-13 ENCOUNTER — Ambulatory Visit (INDEPENDENT_AMBULATORY_CARE_PROVIDER_SITE_OTHER): Payer: Medicare Other | Admitting: Podiatry

## 2016-06-13 ENCOUNTER — Encounter: Payer: Self-pay | Admitting: Podiatry

## 2016-06-13 VITALS — BP 111/66 | HR 102 | Resp 18

## 2016-06-13 DIAGNOSIS — M216X2 Other acquired deformities of left foot: Secondary | ICD-10-CM | POA: Diagnosis not present

## 2016-06-13 NOTE — Progress Notes (Signed)
   Subjective:    Patient ID: [name], female    DOB: 29-Mar-1956, 60 y.o.   MRN: [id number]  HPI   This patient presents for follow-up visit from 05/30/2016. At that time hyperpronation of left foot was diagnosed with associated left medial ankle tendinopathy and a recommendation for patient to wear sturdy lace up athletic style shoes. At this time patient states that her left ankle is feeling considerably better and she denies any pain in the area. There is representative from the group home present in the treatment room today  Review of Systems  All other systems reviewed and are negative.      Objective:   Physical Exam  Patient appears responsive and orientated 3   Vascular: No calf edema or calf tenderness bilaterally DP and PT pulses 2/4 bilaterally Capillary reflex immediate bilaterally  Neurological: Sensation to 10 g monofilament wire intact 5/5 bilaterally Vibratory sensation reactive bilaterally Ankle reflex equal and reactive bilaterally  Dermatological: No open skin lesions bilaterally The toenails are hypertrophic, elongated, deformed 6-10  Musculoskeletal: Weightbearing hyperpronation left Forefoot abducted on the rear foot left Patient not able to heel off left Weekly able to heel off right There is no Palpable tenderness medial ankle proximal to the malleolar area over tendons       Assessment & Plan:   Assessment: Type II diabetic with satisfactory neurovascular status Resolve symptoms from hyperpronation left foot  Plan: At this time I instructed patient to maintain sturdy athletic style shoe and to return if the symptoms reoccur. If patient continued to have symptoms associated with hyperpronation a leg/foot AFO device would be considered  Reappoint 3 months for nail debridement

## 2016-06-13 NOTE — Patient Instructions (Signed)
Wear athletic lace up shoes at all times when standing and walking. The discomfort on your foot/ankle left has improved with wearing athletic lace up style shoes. Please continue to wear lace up style shoes and return if the symptoms recur Return every 3 months for toenail debridement  Gean Birchwood DPM [facility] 06/13/2016

## 2016-08-01 ENCOUNTER — Other Ambulatory Visit: Payer: Self-pay | Admitting: Family Medicine

## 2016-08-01 DIAGNOSIS — Z1231 Encounter for screening mammogram for malignant neoplasm of breast: Secondary | ICD-10-CM

## 2016-08-23 ENCOUNTER — Ambulatory Visit: Payer: Self-pay

## 2016-08-27 ENCOUNTER — Other Ambulatory Visit: Payer: Self-pay | Admitting: Podiatry

## 2016-08-28 ENCOUNTER — Ambulatory Visit: Payer: Self-pay

## 2016-08-29 ENCOUNTER — Encounter: Payer: Self-pay | Admitting: Podiatry

## 2016-08-29 ENCOUNTER — Ambulatory Visit (INDEPENDENT_AMBULATORY_CARE_PROVIDER_SITE_OTHER): Payer: Medicare Other | Admitting: Podiatry

## 2016-08-29 VITALS — BP 93/54 | HR 129 | Resp 18

## 2016-08-29 DIAGNOSIS — B351 Tinea unguium: Secondary | ICD-10-CM

## 2016-08-29 DIAGNOSIS — M79674 Pain in right toe(s): Secondary | ICD-10-CM

## 2016-08-29 DIAGNOSIS — M79675 Pain in left toe(s): Secondary | ICD-10-CM

## 2016-08-29 NOTE — Patient Instructions (Signed)

## 2016-08-30 NOTE — Progress Notes (Signed)
Patient ID: [name], female   DOB: [birth year], 60 y.o.   MRN: [id number]  Subjective: This patient presents today for a scheduled visit of toenails that are uncomfortable walking wearing shoes and she is requesting toenail debridement. Patient has had a history of medial ankle pain associated with hyperpronation that has resolved with wearing athletic style shoes.  Patient has history of diabetes Patient has history of schizophrenia  Objective:  Patient appears responsive and orientated 3  Vascular: No calf edema or calf tenderness bilaterally DP and PT pulses 2/4 bilaterally Capillary reflex immediate bilaterally  Neurological: Sensation to 10 g monofilament wire intact 5/5 bilaterally Vibratory sensation reactive bilaterally Ankle reflex equal and reactive bilaterally  Dermatological: No open skin lesions bilaterally The toenails are hypertrophic, elongated, deformed 6-10  Musculoskeletal: Weightbearing hyperpronation left Forefoot abducted on the rear foot left Patient not able to heel off left Weekly able to heel off right There is no Palpable tenderness medial ankle proximal to the malleolar area over tendons  Assessment: Diabetic with satisfactory neurovascular status Hyperpronation left Symptomatic onychomycoses 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

## 2016-09-20 ENCOUNTER — Ambulatory Visit: Payer: Self-pay

## 2016-09-21 ENCOUNTER — Ambulatory Visit: Payer: Self-pay

## 2016-09-24 ENCOUNTER — Encounter (HOSPITAL_COMMUNITY): Payer: Self-pay | Admitting: Emergency Medicine

## 2016-09-24 ENCOUNTER — Inpatient Hospital Stay (HOSPITAL_COMMUNITY)
Admission: EM | Admit: 2016-09-24 | Discharge: 2016-09-28 | DRG: 812 | Disposition: A | Discharge status: Home Health | Payer: Medicare Other | Attending: Internal Medicine | Admitting: Internal Medicine

## 2016-09-24 DIAGNOSIS — N179 Acute kidney failure, unspecified: Secondary | ICD-10-CM | POA: Diagnosis present

## 2016-09-24 DIAGNOSIS — R55 Syncope and collapse: Secondary | ICD-10-CM | POA: Diagnosis present

## 2016-09-24 DIAGNOSIS — R5383 Other fatigue: Secondary | ICD-10-CM | POA: Diagnosis present

## 2016-09-24 DIAGNOSIS — K921 Melena: Secondary | ICD-10-CM | POA: Diagnosis present

## 2016-09-24 DIAGNOSIS — D649 Anemia, unspecified: Secondary | ICD-10-CM

## 2016-09-24 DIAGNOSIS — Z87891 Personal history of nicotine dependence: Secondary | ICD-10-CM

## 2016-09-24 DIAGNOSIS — E785 Hyperlipidemia, unspecified: Secondary | ICD-10-CM | POA: Diagnosis present

## 2016-09-24 DIAGNOSIS — K317 Polyp of stomach and duodenum: Secondary | ICD-10-CM | POA: Diagnosis present

## 2016-09-24 DIAGNOSIS — K2960 Other gastritis without bleeding: Secondary | ICD-10-CM | POA: Diagnosis present

## 2016-09-24 DIAGNOSIS — D500 Iron deficiency anemia secondary to blood loss (chronic): Principal | ICD-10-CM | POA: Diagnosis present

## 2016-09-24 DIAGNOSIS — E039 Hypothyroidism, unspecified: Secondary | ICD-10-CM | POA: Diagnosis present

## 2016-09-24 DIAGNOSIS — Z7984 Long term (current) use of oral hypoglycemic drugs: Secondary | ICD-10-CM | POA: Diagnosis not present

## 2016-09-24 DIAGNOSIS — K5730 Diverticulosis of large intestine without perforation or abscess without bleeding: Secondary | ICD-10-CM | POA: Diagnosis present

## 2016-09-24 DIAGNOSIS — I10 Essential (primary) hypertension: Secondary | ICD-10-CM | POA: Diagnosis present

## 2016-09-24 DIAGNOSIS — E119 Type 2 diabetes mellitus without complications: Secondary | ICD-10-CM

## 2016-09-24 DIAGNOSIS — K449 Diaphragmatic hernia without obstruction or gangrene: Secondary | ICD-10-CM | POA: Diagnosis present

## 2016-09-24 DIAGNOSIS — K219 Gastro-esophageal reflux disease without esophagitis: Secondary | ICD-10-CM | POA: Diagnosis present

## 2016-09-24 DIAGNOSIS — F200 Paranoid schizophrenia: Secondary | ICD-10-CM | POA: Diagnosis present

## 2016-09-24 DIAGNOSIS — G8929 Other chronic pain: Secondary | ICD-10-CM | POA: Diagnosis present

## 2016-09-24 DIAGNOSIS — Z7982 Long term (current) use of aspirin: Secondary | ICD-10-CM

## 2016-09-24 DIAGNOSIS — D122 Benign neoplasm of ascending colon: Secondary | ICD-10-CM | POA: Diagnosis present

## 2016-09-24 DIAGNOSIS — R109 Unspecified abdominal pain: Secondary | ICD-10-CM

## 2016-09-24 HISTORY — DX: Anemia, unspecified: D64.9

## 2016-09-24 LAB — HEMOGLOBIN AND HEMATOCRIT, BLOOD
HCT: 12.7 % — ABNORMAL LOW (ref 36.0–46.0)
Hemoglobin: 3.4 g/dL — CL (ref 12.0–15.0)

## 2016-09-24 LAB — FERRITIN: Ferritin: 2 ng/mL — ABNORMAL LOW (ref 11–307)

## 2016-09-24 LAB — VITAMIN B12: Vitamin B-12: 573 pg/mL (ref 180–914)

## 2016-09-24 LAB — COMPREHENSIVE METABOLIC PANEL
ALT: 10 U/L — ABNORMAL LOW (ref 14–54)
AST: 19 U/L (ref 15–41)
Albumin: 3.4 g/dL — ABNORMAL LOW (ref 3.5–5.0)
Alkaline Phosphatase: 34 U/L — ABNORMAL LOW (ref 38–126)
Anion gap: 14 (ref 5–15)
BUN: 19 mg/dL (ref 6–20)
CO2: 20 mmol/L — ABNORMAL LOW (ref 22–32)
Calcium: 9.5 mg/dL (ref 8.9–10.3)
Chloride: 101 mmol/L (ref 101–111)
Creatinine, Ser: 1.59 mg/dL — ABNORMAL HIGH (ref 0.44–1.00)
GFR calc Af Amer: 40 mL/min — ABNORMAL LOW (ref >60)
GFR calc non Af Amer: 35 mL/min — ABNORMAL LOW (ref >60)
Glucose, Bld: 231 mg/dL — ABNORMAL HIGH (ref 65–99)
Potassium: 4.8 mmol/L (ref 3.5–5.1)
Sodium: 135 mmol/L (ref 135–145)
Total Bilirubin: 0.5 mg/dL (ref 0.3–1.2)
Total Protein: 6.7 g/dL (ref 6.5–8.1)

## 2016-09-24 LAB — APTT: aPTT: 27 seconds (ref 24–36)

## 2016-09-24 LAB — CBC WITH DIFFERENTIAL/PLATELET
Basophils Absolute: 0 10*3/uL (ref 0.0–0.1)
Basophils Relative: 0 %
Eosinophils Absolute: 0 10*3/uL (ref 0.0–0.7)
Eosinophils Relative: 0 %
HCT: 12.7 % — ABNORMAL LOW (ref 36.0–46.0)
Hemoglobin: 3.4 g/dL — CL (ref 12.0–15.0)
Lymphocytes Relative: 21 %
Lymphs Abs: 2.1 10*3/uL (ref 0.7–4.0)
MCH: 17.7 pg — ABNORMAL LOW (ref 26.0–34.0)
MCHC: 26.8 g/dL — ABNORMAL LOW (ref 30.0–36.0)
MCV: 66.1 fL — ABNORMAL LOW (ref 78.0–100.0)
Monocytes Absolute: 0.6 10*3/uL (ref 0.1–1.0)
Monocytes Relative: 6 %
Neutro Abs: 7.5 10*3/uL (ref 1.7–7.7)
Neutrophils Relative %: 73 %
Platelets: 674 10*3/uL — ABNORMAL HIGH (ref 150–400)
RBC: 1.92 MIL/uL — ABNORMAL LOW (ref 3.87–5.11)
RDW: 17.5 % — ABNORMAL HIGH (ref 11.5–15.5)
Smear Review: INCREASED
WBC: 10.2 10*3/uL (ref 4.0–10.5)

## 2016-09-24 LAB — GLUCOSE, CAPILLARY: Glucose-Capillary: 176 mg/dL — ABNORMAL HIGH (ref 65–99)

## 2016-09-24 LAB — IRON AND TIBC
Iron: 6 ug/dL — ABNORMAL LOW (ref 28–170)
Saturation Ratios: 1 % — ABNORMAL LOW (ref 10.4–31.8)
TIBC: 594 ug/dL — ABNORMAL HIGH (ref 250–450)
UIBC: 588 ug/dL

## 2016-09-24 LAB — PREPARE RBC (CROSSMATCH)

## 2016-09-24 LAB — PROTIME-INR
INR: 1.16
Prothrombin Time: 14.9 seconds (ref 11.4–15.2)

## 2016-09-24 LAB — RETICULOCYTES
RBC.: 1.98 MIL/uL — ABNORMAL LOW (ref 3.87–5.11)
Retic Count, Absolute: 49.5 10*3/uL (ref 19.0–186.0)
Retic Ct Pct: 2.5 % (ref 0.4–3.1)

## 2016-09-24 LAB — POC OCCULT BLOOD, ED: Fecal Occult Bld: NEGATIVE

## 2016-09-24 LAB — FOLATE: Folate: 51.3 ng/mL (ref >5.9)

## 2016-09-24 LAB — ABO/RH: ABO/RH(D): A POS

## 2016-09-24 MED ORDER — ATORVASTATIN CALCIUM 40 MG PO TABS
40.0000 mg | ORAL_TABLET | Freq: Every day | ORAL | Status: DC
  Administered 2016-09-24 – 2016-09-27 (×4): 40 mg via ORAL
  Filled 2016-09-24 (×4): qty 1

## 2016-09-24 MED ORDER — ONDANSETRON HCL 4 MG/2ML IJ SOLN: 4.0000 mg | Freq: Four times a day (QID) | INTRAMUSCULAR | Status: DC | PRN

## 2016-09-24 MED ORDER — ESCITALOPRAM OXALATE 20 MG PO TABS
20.0000 mg | ORAL_TABLET | ORAL | Status: DC
  Administered 2016-09-25 – 2016-09-28 (×3): 20 mg via ORAL
  Filled 2016-09-24 (×3): qty 1

## 2016-09-24 MED ORDER — ALLOPURINOL 100 MG PO TABS
100.0000 mg | ORAL_TABLET | Freq: Two times a day (BID) | ORAL | Status: DC
  Administered 2016-09-24 – 2016-09-28 (×7): 100 mg via ORAL
  Filled 2016-09-24 (×7): qty 1

## 2016-09-24 MED ORDER — LATANOPROST 0.005 % OP SOLN
1.0000 [drp] | Freq: Every day | OPHTHALMIC | Status: DC
  Administered 2016-09-24 – 2016-09-27 (×4): 1 [drp] via OPHTHALMIC
  Filled 2016-09-24: qty 2.5

## 2016-09-24 MED ORDER — ACETAMINOPHEN 650 MG RE SUPP: 650.0000 mg | Freq: Four times a day (QID) | RECTAL | Status: DC | PRN

## 2016-09-24 MED ORDER — PALIPERIDONE ER 1.5 MG PO TB24: 1.5000 mg | ORAL_TABLET | Freq: Every day | ORAL | Status: DC

## 2016-09-24 MED ORDER — DIVALPROEX SODIUM ER 500 MG PO TB24
1000.0000 mg | ORAL_TABLET | Freq: Every day | ORAL | Status: DC
  Administered 2016-09-24 – 2016-09-27 (×4): 1000 mg via ORAL
  Filled 2016-09-24 (×4): qty 2

## 2016-09-24 MED ORDER — PANTOPRAZOLE SODIUM 40 MG IV SOLR
40.0000 mg | Freq: Once | INTRAVENOUS | Status: AC
  Administered 2016-09-24: 40 mg via INTRAVENOUS
  Filled 2016-09-24: qty 40

## 2016-09-24 MED ORDER — INSULIN ASPART 100 UNIT/ML ~~LOC~~ SOLN
0.0000 [IU] | Freq: Three times a day (TID) | SUBCUTANEOUS | Status: DC
  Administered 2016-09-25: 3 [IU] via SUBCUTANEOUS
  Administered 2016-09-25 – 2016-09-27 (×4): 2 [IU] via SUBCUTANEOUS
  Administered 2016-09-28: 3 [IU] via SUBCUTANEOUS
  Administered 2016-09-28: 2 [IU] via SUBCUTANEOUS

## 2016-09-24 MED ORDER — TRAZODONE HCL 100 MG PO TABS
100.0000 mg | ORAL_TABLET | Freq: Every day | ORAL | Status: DC
  Administered 2016-09-24 – 2016-09-27 (×4): 100 mg via ORAL
  Filled 2016-09-24 (×4): qty 1

## 2016-09-24 MED ORDER — BENZTROPINE MESYLATE 2 MG PO TABS
2.0000 mg | ORAL_TABLET | Freq: Two times a day (BID) | ORAL | Status: DC
  Administered 2016-09-24 – 2016-09-28 (×7): 2 mg via ORAL
  Filled 2016-09-24 (×8): qty 1

## 2016-09-24 MED ORDER — LACTATED RINGERS IV SOLN: INTRAVENOUS | Status: DC

## 2016-09-24 MED ORDER — ONDANSETRON HCL 4 MG PO TABS: 4.0000 mg | ORAL_TABLET | Freq: Four times a day (QID) | ORAL | Status: DC | PRN

## 2016-09-24 MED ORDER — LEVOTHYROXINE SODIUM 88 MCG PO TABS
88.0000 ug | ORAL_TABLET | Freq: Every day | ORAL | Status: DC
  Administered 2016-09-25 – 2016-09-28 (×3): 88 ug via ORAL
  Filled 2016-09-24 (×3): qty 1

## 2016-09-24 MED ORDER — INSULIN ASPART 100 UNIT/ML ~~LOC~~ SOLN
0.0000 [IU] | Freq: Every day | SUBCUTANEOUS | Status: DC
  Administered 2016-09-27: 2 [IU] via SUBCUTANEOUS

## 2016-09-24 MED ORDER — SODIUM CHLORIDE 0.9 % IV SOLN
Freq: Once | INTRAVENOUS | Status: AC
  Administered 2016-09-24: 23:00:00 via INTRAVENOUS

## 2016-09-24 MED ORDER — PANTOPRAZOLE SODIUM 40 MG IV SOLR
40.0000 mg | Freq: Two times a day (BID) | INTRAVENOUS | Status: DC
  Administered 2016-09-25 – 2016-09-28 (×8): 40 mg via INTRAVENOUS
  Filled 2016-09-24 (×8): qty 40

## 2016-09-24 MED ORDER — SODIUM CHLORIDE 0.9 % IV SOLN
10.0000 mL/h | Freq: Once | INTRAVENOUS | Status: AC
  Administered 2016-09-24: 10 mL/h via INTRAVENOUS

## 2016-09-24 MED ORDER — COLCHICINE 0.6 MG PO TABS: 0.6000 mg | ORAL_TABLET | Freq: Every day | ORAL | Status: DC

## 2016-09-24 MED ORDER — ACETAMINOPHEN 325 MG PO TABS
650.0000 mg | ORAL_TABLET | Freq: Four times a day (QID) | ORAL | Status: DC | PRN
  Administered 2016-09-24 – 2016-09-25 (×2): 650 mg via ORAL
  Filled 2016-09-24 (×2): qty 2

## 2016-09-24 NOTE — ED Triage Notes (Addendum)
Has been weak and went to dr today aND WAS FOUND TO HAVE LOW HGB, PT PALE and sob on exertion, black stools x a week or more

## 2016-09-24 NOTE — ED Notes (Signed)
RN attempted 2nd IV unsuccessfully.

## 2016-09-24 NOTE — ED Provider Notes (Signed)
[facility] DEPT Provider Note   CSN: [id number] Arrival date & time: 09/24/16  1437     History   Chief Complaint Chief Complaint  Patient presents with  . GI Bleeding    HPI [name] is a 61 y.o. female.  HPI Pt has been having trouble with fatigue and dyspnea for the last few weeks.  It has been increasing in severity.  She has been constipated and has noticed blood in her stool.  She thought it could be hemorrhoid.  No fevers.  No vomiting or diarrhea.  No chest pain.  No abd pain.  She saw her doctor today.  She was called and told to come to the hospital immediately because she was very anemic. Past Medical History:  Diagnosis Date  . Bipolar affective disorder ([place]) 04/29/2012  . Borderline personality disorder   . Chronic back pain   . Depression   . Diabetes mellitus   . Diabetes mellitus ([place]) 04/29/2012  . GERD (gastroesophageal reflux disease)   . HTN (hypertension) 04/29/2012  . Hyperlipidemia   . Hypothyroidism   . Schizoaffective disorder   . Schizoaffective disorder ([place]) 04/29/2012  . Schizophrenia [facility])     Patient Active Problem List   Diagnosis Date Noted  . Schizophrenia, paranoid type ([place]) 05/05/2012  . Anemia 04/30/2012  . Hyponatremia 04/29/2012  . Chloride, decreased level 04/29/2012  . Passive suicidal ideations 04/29/2012  . Suicidal ideations 04/29/2012  . HTN (hypertension) 04/29/2012  . Diabetes mellitus ([place]) 04/29/2012  . GERD (gastroesophageal reflux disease)   . Hyperlipidemia   . Chronic back pain   . Hypothyroidism   . Schizophrenia [facility])     Past Surgical History:  Procedure Laterality Date  . APPENDECTOMY    . CHOLECYSTECTOMY    . KNEE SURGERY     right    OB History    No data available       Home Medications    Prior to Admission medications   Medication Sig Start Date End Date Taking? Authorizing Provider  paliperidone (INVEGA SUSTENNA) 234 MG/1.5ML SUSP injection Inject 234 mg into the muscle every  30 (thirty) days.   Yes Historical Provider, MD  allopurinol (ZYLOPRIM) 100 MG tablet Take 100 mg by mouth 2 (two) times daily.    Historical Provider, MD  amLODipine (NORVASC) 10 MG tablet Take 10 mg by mouth daily.    Historical Provider, MD  aspirin 81 MG chewable tablet Chew 81 mg by mouth daily.    Historical Provider, MD  benztropine (COGENTIN) 2 MG tablet Take 2 mg by mouth 2 (two) times daily.    Historical Provider, MD  colchicine 0.6 MG tablet Take 0.6 mg by mouth 2 (two) times daily as needed (Gout.).    Historical Provider, MD  colchicine 0.6 MG tablet Take 0.6 mg by mouth daily.    Historical Provider, MD  divalproex (DEPAKOTE ER) 500 MG 24 hr tablet  08/28/13   Historical Provider, MD  docusate sodium (COLACE) 100 MG capsule Take 100 mg by mouth 2 (two) times daily.    Historical Provider, MD  escitalopram (LEXAPRO) 20 MG tablet Take 20 mg by mouth daily.    Historical Provider, MD  esomeprazole (NEXIUM) 40 MG capsule Take 40 mg by mouth daily before breakfast.    Historical Provider, MD  ferrous sulfate 325 (65 FE) MG tablet Take 325 mg by mouth daily with breakfast.    Historical Provider, MD  glipiZIDE (GLUCOTROL) 10 MG tablet Take 10 mg  by mouth 2 (two) times daily before a meal.    Historical Provider, MD  indomethacin (INDOCIN) 50 MG capsule Take 50 mg by mouth 3 (three) times daily as needed (inflammation.).    Historical Provider, MD  levothyroxine (SYNTHROID, LEVOTHROID) 88 MCG tablet Take 88 mcg by mouth daily before breakfast.    Historical Provider, MD  lisinopril (PRINIVIL,ZESTRIL) 10 MG tablet Take 10 mg by mouth daily.    Historical Provider, MD  meloxicam (MOBIC) 15 MG tablet TAKE 1 TABLET BY MOUTH ONCE DAILY. 07/21/15   Max T Hyatt, DPM  metFORMIN (GLUCOPHAGE) 1000 MG tablet Take 1,000 mg by mouth 2 (two) times daily with a meal.    Historical Provider, MD  Oxcarbazepine (TRILEPTAL) 300 MG tablet Take 300 mg by mouth at bedtime.    Historical Provider, MD    paliperidone (INVEGA) 9 MG 24 hr tablet Take 9 mg by mouth at bedtime.    Historical Provider, MD  Paliperidone Palmitate 117 MG/0.75ML SUSP Inject 117 mg into the muscle every 30 (thirty) days. For psychosis.  The next injection is due on June 05, 2012. 05/15/12   Mellissa Kohut, MD  pantoprazole (PROTONIX) 40 MG tablet  08/27/13   Historical Provider, MD  polyethylene glycol (MIRALAX / GLYCOLAX) packet Take 17 g by mouth daily.    Historical Provider, MD  QUEtiapine (SEROQUEL) 100 MG tablet Take 50 mg by mouth at bedtime.    Historical Provider, MD  sertraline (ZOLOFT) 100 MG tablet Take 100 mg by mouth 2 (two) times daily. Take 1 tablet every morning and 1 tablet every day at 2pm.    Historical Provider, MD  simvastatin (ZOCOR) 80 MG tablet Take 80 mg by mouth daily.    Historical Provider, MD  traZODone (DESYREL) 100 MG tablet Take 200 mg by mouth at bedtime.    Historical Provider, MD  vitamin E 400 UNIT capsule Take 400 Units by mouth daily.    Historical Provider, MD    Family History No family history on file.  Social History Social History  Substance Use Topics  . Smoking status: Former Research scientist (life sciences)  . Smokeless tobacco: Never Used  . Alcohol use No     Allergies   Patient has no known allergies.   Review of Systems Review of Systems  HENT:       Some pain with swallowing  All other systems reviewed and are negative.    Physical Exam Updated Vital Signs BP (!) 107/52   Pulse 113   Temp 98 F (36.7 C) (Oral)   Resp 16   SpO2 100%   Physical Exam  Constitutional: No distress.  HENT:  Head: Normocephalic and atraumatic.  Right Ear: External ear normal.  Left Ear: External ear normal.  Eyes: Conjunctivae are normal. Right eye exhibits no discharge. Left eye exhibits no discharge. No scleral icterus.  Neck: Neck supple. No tracheal deviation present.  Cardiovascular: Normal rate, regular rhythm and intact distal pulses.   Pulmonary/Chest: Effort normal and  breath sounds normal. No stridor. No respiratory distress. She has no wheezes. She has no rales.  Abdominal: Soft. Bowel sounds are normal. She exhibits no distension. There is no tenderness. There is no rebound and no guarding.  Genitourinary:  Genitourinary Comments: Brown stool, no bright red blood or melena  Musculoskeletal: She exhibits no edema or tenderness.  Neurological: She is alert. She has normal strength. No cranial nerve deficit (no facial droop, extraocular movements intact, no slurred speech) or sensory deficit. She  exhibits normal muscle tone. She displays no seizure activity. Coordination normal.  Skin: Skin is warm and dry. No rash noted. She is not diaphoretic. There is pallor.  Psychiatric: She has a normal mood and affect.  Nursing note and vitals reviewed.    ED Treatments / Results  Labs (all labs ordered are listed, but only abnormal results are displayed) Labs Reviewed  CBC WITH DIFFERENTIAL/PLATELET - Abnormal; Notable for the following:       Result Value   RBC 1.92 (*)    Hemoglobin 3.4 (*)    HCT 12.7 (*)    MCV 66.1 (*)    MCH 17.7 (*)    MCHC 26.8 (*)    RDW 17.5 (*)    Platelets 674 (*)    All other components within normal limits  COMPREHENSIVE METABOLIC PANEL - Abnormal; Notable for the following:    CO2 20 (*)    Glucose, Bld 231 (*)    Creatinine, Ser 1.59 (*)    Albumin 3.4 (*)    ALT 10 (*)    Alkaline Phosphatase 34 (*)    GFR calc non Af Amer 35 (*)    GFR calc Af Amer 40 (*)    All other components within normal limits  HEMOGLOBIN AND HEMATOCRIT, BLOOD - Abnormal; Notable for the following:    Hemoglobin 3.4 (*)    HCT 12.7 (*)    All other components within normal limits  APTT  PROTIME-INR  VITAMIN B12  FOLATE  IRON AND TIBC  FERRITIN  RETICULOCYTES  POC OCCULT BLOOD, ED  TYPE AND SCREEN  ABO/RH  PREPARE RBC (CROSSMATCH)    Procedures .Critical Care Performed by: Dorie Rank Authorized by: Dorie Rank   Critical care  provider statement:    Critical care time (minutes):  30   Critical care was time spent personally by me on the following activities:  Discussions with consultants, evaluation of patient's response to treatment, examination of patient, ordering and performing treatments and interventions, ordering and review of laboratory studies, ordering and review of radiographic studies, pulse oximetry, re-evaluation of patient's condition, obtaining history from patient or surrogate and review of old charts   (including critical care time)  Medications Ordered in ED Medications  pantoprazole (PROTONIX) injection 40 mg (not administered)  0.9 %  sodium chloride infusion (not administered)     Initial Impression / Assessment and Plan / ED Course  I have reviewed the triage vital signs and the nursing notes.  Pertinent labs & imaging results that were available during my care of the patient were reviewed by me and considered in my medical decision making (see chart for details).  Clinical Course     The patient has profound anemia with a hemoglobin of 3.4. Patient denies any recent blood in her stool but she mentions having some previously. Patient denies any chest pain. She does not have any dyspnea at rest but has symptomatic anemia whenever she tries to walk around. I suspect that her anemia is related to a GI bleed but her fecal occult test is negative at this point. I have ordered an anemia panel and we will arrange for urgent blood transfusion and admission to the hospital.  Final Clinical Impressions(s) / ED Diagnoses   Final diagnoses:  Anemia, unspecified type    New Prescriptions New Prescriptions   No medications on file     Dorie Rank, MD 09/24/16 [id number]

## 2016-09-24 NOTE — H&P (Addendum)
History and Physical    [name]:[id number] DOB: [birth year] DOA: 09/24/2016  PCP: Leonard Downing, MD Consultants:  Psychiatry - Josph Macho at [facility] Patient coming from: group home setting; NOK: sisters, [phone], Windy Canny  Chief Complaint: symptomatic anemia - sent by PCP  HPI: [name] is a 61 y.o. female with medical history significant of DM, HTN, HLD, hypothyroidism, and multiple psychiatric diagnoses presenting with symptomatic anemia.  Patient has been feeling very sluggish, SOB, difficulty walking far without sitting, feels like passing out.  Symptoms for about 3 weeks.  Has been constipated for a while, thinks she has some hemorrhoids with bright red blood about 3 weeks ago that she attributed to hemorrhoids.  Thinks her last C-scope was about 9 years ago, in [place].   Some dysphagia currently, h/o esophageal dilation.  Slight chest pain over the 3 weeks.  Has been anemic in the past, has never needed transfusion.     ED Course: Per Dr. Tomi Bamberger: The patient has profound anemia with a hemoglobin of 3.4. Patient denies any recent blood in her stool but she mentions having some previously. Patient denies any chest pain. She does not have any dyspnea at rest but has symptomatic anemia whenever she tries to walk around. I suspect that her anemia is related to a GI bleed but her fecal occult test is negative at this point. I have ordered an anemia panel and we will arrange for urgent blood transfusion and admission to the hospital.   Review of Systems: As per HPI; otherwise 10 point review of systems reviewed and negative.   Ambulatory Status:  Ambulates with a cane, occasionally a walker  Past Medical History:  Diagnosis Date  . Bipolar affective disorder ([place]) 04/29/2012  . Borderline personality disorder   . Chronic back pain   . Depression   . Diabetes mellitus ([place]) 04/29/2012  . GERD (gastroesophageal reflux disease)   . HTN (hypertension) 04/29/2012    . Hyperlipidemia   . Hypothyroidism   . Schizoaffective disorder ([place]) 04/29/2012  . Schizophrenia [facility])     Past Surgical History:  Procedure Laterality Date  . APPENDECTOMY    . CHOLECYSTECTOMY    . KNEE SURGERY     right    Social History   Social History  . Marital status: Single    Spouse name: N/A  . Number of children: N/A  . Years of education: N/A   Occupational History  . disabled    Social History Main Topics  . Smoking status: Former Smoker    Quit date: 2008  . Smokeless tobacco: Never Used  . Alcohol use No     Comment: h/o use but none in years  . Drug use: No     Comment: remote h/o drug use - cocaine, marijuana, uppers, downers, qualudes  . Sexual activity: No   Other Topics Concern  . Not on file   Social History Narrative  . No narrative on file    No Known Allergies  Family History  Problem Relation Age of Onset  . CVA Mother 67  . Dementia Father 19  . CAD Maternal Grandmother   . Colon cancer Maternal Grandfather     Prior to Admission medications   Medication Sig Start Date End Date Taking? Authorizing Provider  allopurinol (ZYLOPRIM) 100 MG tablet Take 100 mg by mouth 2 (two) times daily.   Yes Historical Provider, MD  atorvastatin (LIPITOR) 40 MG tablet Take 40 mg by mouth daily at  6 PM.   Yes Historical Provider, MD  benztropine (COGENTIN) 2 MG tablet Take 2 mg by mouth 2 (two) times daily.   Yes Historical Provider, MD  colchicine 0.6 MG tablet Take 0.6 mg by mouth daily.   Yes Historical Provider, MD  divalproex (DEPAKOTE ER) 500 MG 24 hr tablet Take 1,000 mg by mouth at bedtime.  08/28/13  Yes Historical Provider, MD  escitalopram (LEXAPRO) 20 MG tablet Take 20 mg by mouth every morning.    Yes Historical Provider, MD  glipiZIDE (GLUCOTROL) 10 MG tablet Take 10 mg by mouth 2 (two) times daily before a meal.   Yes Historical Provider, MD  latanoprost (XALATAN) 0.005 % ophthalmic solution Place 1 drop into both eyes at bedtime.    Yes Historical Provider, MD  levothyroxine (SYNTHROID, LEVOTHROID) 88 MCG tablet Take 88 mcg by mouth daily before breakfast.   Yes Historical Provider, MD  lisinopril (PRINIVIL,ZESTRIL) 10 MG tablet Take 10 mg by mouth daily.   Yes Historical Provider, MD  meloxicam (MOBIC) 15 MG tablet TAKE 1 TABLET BY MOUTH ONCE DAILY. 07/21/15  Yes Max T Hyatt, DPM  metFORMIN (GLUCOPHAGE) 1000 MG tablet Take 1,000 mg by mouth 2 (two) times daily with a meal.   Yes Historical Provider, MD  paliperidone (INVEGA SUSTENNA) 234 MG/1.5ML SUSP injection Inject 234 mg into the muscle every 30 (thirty) days.   Yes Historical Provider, MD  Paliperidone (INVEGA) 1.5 MG TB24 Take 1.5 mg by mouth at bedtime.   Yes Historical Provider, MD  polyethylene glycol (MIRALAX / GLYCOLAX) packet Take 17 g by mouth daily.   Yes Historical Provider, MD  traZODone (DESYREL) 100 MG tablet Take 100 mg by mouth at bedtime.    Yes Historical Provider, MD    Physical Exam: Vitals:   09/24/16 1730 09/24/16 1800 09/24/16 1829 09/24/16 1830  BP: 137/64 139/68 133/64 133/64  Pulse: 103 98 96 99  Resp:   18   Temp:   99 F (37.2 C)   TempSrc:   Oral   SpO2: 100% 100% 100% 100%     General: Appears calm and comfortable and is NAD, very pale Eyes:  PERRL, EOMI, normal lids, iris; marked conjunctival pallor ENT:  grossly normal hearing, lips & tongue, mmm Neck:  no LAD, masses or thyromegaly Cardiovascular:  tachycardia, no m/r/g. No LE edema.  Respiratory:  CTA bilaterally, no w/r/r. Normal respiratory effort. Abdomen:  soft, ntnd, NABS Skin:  no rash or induration seen on limited exam Musculoskeletal:  grossly normal tone BUE/BLE, good ROM, no bony abnormality Psychiatric:  grossly normal mood and affect, speech fluent and appropriate, AOx3; she is a remarkably good historian given her long-standing psychiatric disease history Neurologic:  CN 2-12 grossly intact, moves all extremities in coordinated fashion, sensation  intact  Labs on Admission: I have personally reviewed following labs and imaging studies  CBC:  Recent Labs Lab 09/24/16 1540 09/24/16 1711  WBC 10.2  --   NEUTROABS 7.5  --   HGB 3.4* 3.4*  HCT 12.7* 12.7*  MCV 66.1*  --   PLT 674*  --    Basic Metabolic Panel:  Recent Labs Lab 09/24/16 1540  NA 135  K 4.8  CL 101  CO2 20*  GLUCOSE 231*  BUN 19  CREATININE 1.59*  CALCIUM 9.5   GFR: CrCl cannot be calculated (Unknown ideal weight.). Liver Function Tests:  Recent Labs Lab 09/24/16 1540  AST 19  ALT 10*  ALKPHOS 34*  BILITOT 0.5  PROT 6.7  ALBUMIN 3.4*   No results for input(s): LIPASE, AMYLASE in the last 168 hours. No results for input(s): AMMONIA in the last 168 hours. Coagulation Profile:  Recent Labs Lab 09/24/16 1725  INR 1.16   Cardiac Enzymes: No results for input(s): CKTOTAL, CKMB, CKMBINDEX, TROPONINI in the last 168 hours. BNP (last 3 results) No results for input(s): PROBNP in the last 8760 hours. HbA1C: No results for input(s): HGBA1C in the last 72 hours. CBG: No results for input(s): GLUCAP in the last 168 hours. Lipid Profile: No results for input(s): CHOL, HDL, LDLCALC, TRIG, CHOLHDL, LDLDIRECT in the last 72 hours. Thyroid Function Tests: No results for input(s): TSH, T4TOTAL, FREET4, T3FREE, THYROIDAB in the last 72 hours. Anemia Panel:  Recent Labs  09/24/16 1725  RETICCTPCT 2.5   Urine analysis:    Component Value Date/Time   COLORURINE YELLOW 04/30/2012 1140   APPEARANCEUR CLEAR 04/30/2012 1140   LABSPEC 1.011 04/30/2012 1140   PHURINE 7.0 04/30/2012 1140   GLUCOSEU NEGATIVE 04/30/2012 1140   HGBUR NEGATIVE 04/30/2012 1140   BILIRUBINUR NEGATIVE 04/30/2012 1140   KETONESUR NEGATIVE 04/30/2012 1140   PROTEINUR NEGATIVE 04/30/2012 1140   UROBILINOGEN 1.0 04/30/2012 1140   NITRITE NEGATIVE 04/30/2012 1140   LEUKOCYTESUR NEGATIVE 04/30/2012 1140    Creatinine Clearance: CrCl cannot be calculated (Unknown ideal  weight.).  Sepsis Labs: '@LABRCNTIP'$ (procalcitonin:4,lacticidven:4) )No results found for this or any previous visit (from the past 240 hour(s)).   Radiological Exams on Admission: No results found.  EKG: not done  Assessment/Plan Principal Problem:   Symptomatic anemia Active Problems:   Hypothyroidism   Schizophrenia (HCC)   HTN (hypertension)   Diabetes mellitus (HCC)   AKI (acute kidney injury) (HCC)   Symptomatic anemia -MCV is <80 and so this is microcytic anemia -MCV is < 70 indicating probable iron deficiency -With an MCV <70, elevated RDW, and reason for iron deficiency - iron deficiency can be presumed -With ongoing question, will check ferritin; if <15, diagnostic of iron deficiency; if >100, very unlikely iron deficiency -If ferritin is non-diagnostic, will check transferrin saturation - the lower it is, the more likely this is iron deficiency anemia -Given the likelihood that this is iron deficiency, we then look at her reticulocyte count.  It is not elevated and so this is concerning for an acute process, likely a bleed. -Her HR is also elevated, which also may indicate an acute process. -Her stool guaiac was negative, but GI bleed remains a significant concern. -Suggest GI consultation in AM; will make patient NPO after midnight in case a procedure is desired. -BUN is normal, decreasing the likelihood of an upper GI source for bleeding.  However, she has been taking Mobic; will hold. -Protonix IV BID for now. -If GI evaluation does not indicate a source, would suggest heme/onc consultation for possible bone marrow biopsy. -The ER has ordered 2 units PRBC, but will increase this to 4 units total since each unit is only expected to raise Hgb by 1 point (so shooting for goal Hgb of 8, particularly given lack of clear source of anemia). -Will recheck CBC post-transfusion and in AM.  AKI -Uncertain baseline creatinine since it has not been reported in Epic since  2/16. -Creatinine 1.59 with normal BUN (1.06 in 2/16) -Patient should receive adequate hydration with blood products and will also give IVF while patient is NPO -Recheck creatinine in AM -Hold nephrotoxic agents when possible.  DM -Glucose 231 -Last reported A1c was in 2013 so  will recheck -Cover with SSI for now and hold oral medications  Hypothyroidism -Check TSH, last reported check in Epic was in 2013 -Continue Synthroid at current dose for now  HTN -Hold Lisinopril and follow.  Schizophrenia -Continue home meds: Cogentin, Depakote, Lexapro, Invega, Trazodone -Lives in a group home  DVT prophylaxis:  SCDs Code Status:  Full - confirmed with patient Family Communication: None present Disposition Plan: Home once clinically improved Consults called: Consider GI in AM Admission status: Admit - It is my clinical opinion that admission to INPATIENT is reasonable and necessary because this patient will require at least 2 midnights in the hospital to treat this condition based on the medical complexity of the problems presented.  Given the aforementioned information, the predictability of an adverse outcome is felt to be significant.    Karmen Bongo MD [facility]  If 7PM-7AM, please contact night-coverage [email or web address] Password [email or web address]  09/24/2016, 7:21 PM

## 2016-09-25 ENCOUNTER — Inpatient Hospital Stay (HOSPITAL_COMMUNITY): Payer: Medicare Other

## 2016-09-25 ENCOUNTER — Encounter (HOSPITAL_COMMUNITY): Payer: Self-pay | Admitting: General Practice

## 2016-09-25 DIAGNOSIS — D649 Anemia, unspecified: Secondary | ICD-10-CM

## 2016-09-25 HISTORY — DX: Anemia, unspecified: D64.9

## 2016-09-25 LAB — BASIC METABOLIC PANEL
Anion gap: 8 (ref 5–15)
BUN: 16 mg/dL (ref 6–20)
CO2: 27 mmol/L (ref 22–32)
Calcium: 9.5 mg/dL (ref 8.9–10.3)
Chloride: 100 mmol/L — ABNORMAL LOW (ref 101–111)
Creatinine, Ser: 1.27 mg/dL — ABNORMAL HIGH (ref 0.44–1.00)
GFR calc Af Amer: 53 mL/min — ABNORMAL LOW (ref >60)
GFR calc non Af Amer: 46 mL/min — ABNORMAL LOW (ref >60)
Glucose, Bld: 130 mg/dL — ABNORMAL HIGH (ref 65–99)
Potassium: 4.3 mmol/L (ref 3.5–5.1)
Sodium: 135 mmol/L (ref 135–145)

## 2016-09-25 LAB — GLUCOSE, CAPILLARY
Glucose-Capillary: 148 mg/dL — ABNORMAL HIGH (ref 65–99)
Glucose-Capillary: 154 mg/dL — ABNORMAL HIGH (ref 65–99)
Glucose-Capillary: 132 mg/dL — ABNORMAL HIGH (ref 65–99)
Glucose-Capillary: 120 mg/dL — ABNORMAL HIGH (ref 65–99)
Glucose-Capillary: 102 mg/dL — ABNORMAL HIGH (ref 65–99)

## 2016-09-25 LAB — CBC WITH DIFFERENTIAL/PLATELET
Basophils Absolute: 0.1 10*3/uL (ref 0.0–0.1)
Basophils Relative: 1 %
Eosinophils Absolute: 0.2 10*3/uL (ref 0.0–0.7)
Eosinophils Relative: 2 %
HCT: 25.2 % — ABNORMAL LOW (ref 36.0–46.0)
Hemoglobin: 8.2 g/dL — ABNORMAL LOW (ref 12.0–15.0)
Lymphocytes Relative: 13 %
Lymphs Abs: 1.1 10*3/uL (ref 0.7–4.0)
MCH: 23.4 pg — ABNORMAL LOW (ref 26.0–34.0)
MCHC: 32.5 g/dL (ref 30.0–36.0)
MCV: 71.8 fL — ABNORMAL LOW (ref 78.0–100.0)
Monocytes Absolute: 0.9 10*3/uL (ref 0.1–1.0)
Monocytes Relative: 11 %
Neutro Abs: 6.3 10*3/uL (ref 1.7–7.7)
Neutrophils Relative %: 74 %
Platelets: 503 10*3/uL — ABNORMAL HIGH (ref 150–400)
RBC: 3.51 MIL/uL — ABNORMAL LOW (ref 3.87–5.11)
RDW: 19.1 % — ABNORMAL HIGH (ref 11.5–15.5)
WBC: 8.6 10*3/uL (ref 4.0–10.5)

## 2016-09-25 LAB — MRSA PCR SCREENING: MRSA by PCR: NEGATIVE

## 2016-09-25 MED ORDER — WHITE PETROLATUM GEL
Status: AC
  Administered 2016-09-25: 04:00:00
  Filled 2016-09-25: qty 1

## 2016-09-25 MED ORDER — PEG 3350-KCL-NA BICARB-NACL 420 G PO SOLR
4000.0000 mL | Freq: Once | ORAL | Status: AC
  Administered 2016-09-25: 4000 mL via ORAL
  Filled 2016-09-25: qty 4000

## 2016-09-25 MED ORDER — SODIUM CHLORIDE 0.9 % IV SOLN
INTRAVENOUS | Status: DC
  Administered 2016-09-25: 18:00:00 via INTRAVENOUS
  Administered 2016-09-26: 500 mL via INTRAVENOUS

## 2016-09-25 MED ORDER — SODIUM CHLORIDE 0.9 % IV SOLN
510.0000 mg | Freq: Once | INTRAVENOUS | Status: AC
  Administered 2016-09-25: 510 mg via INTRAVENOUS
  Filled 2016-09-25: qty 17

## 2016-09-25 MED ORDER — SENNOSIDES-DOCUSATE SODIUM 8.6-50 MG PO TABS
1.0000 | ORAL_TABLET | Freq: Two times a day (BID) | ORAL | Status: DC
  Administered 2016-09-25 – 2016-09-28 (×5): 1 via ORAL
  Filled 2016-09-25 (×5): qty 1

## 2016-09-25 MED ORDER — POLYETHYLENE GLYCOL 3350 17 G PO PACK
17.0000 g | PACK | Freq: Every day | ORAL | Status: DC
  Administered 2016-09-25 – 2016-09-28 (×3): 17 g via ORAL
  Filled 2016-09-25 (×3): qty 1

## 2016-09-25 NOTE — Progress Notes (Signed)
[facility] Progress Note  Patient: [name] [id number]   PCP: Leonard Downing, MD DOB: Sep 17, 1956   DOA: 09/24/2016   DOS: 09/25/2016   Date of Service: the patient was seen and examined on 09/25/2016  Brief hospital course: Pt. with PMH of Schizophrenia, hypothyroidism, HTN, type II DM; admitted on 09/24/2016, with complaint of fatigue and tiredness with near syncope, was found to have symptomatic anemia with hemoglobin of 3.2. Currently further plan is further workup.  Assessment and Plan: 1. Symptomatic anemia Severe iron deficiency anemia with iron 6, ferritin 2. Normal 0000000 and folic acid. Normal reticulocyte count suggest no active bleeding or blood loss. Patient denies any menstrual bleeding. CT abdomen and pelvis without any evidence of retroperitoneal bleed or uterine mass. GI consulted. EGD colonoscopy tomorrow. With severe iron deficiency I would give the patient IV iron as well.  2. AKI -Uncertain baseline creatinine since it has not been reported in Epic since 2/16. -Creatinine 1.59 with normal BUN (1.06 in 2/16) -resolved with IV hydration -Hold nephrotoxic agents when possible.  DM -Last reported A1c was in 2013 so will recheck -Cover with SSI for now and hold oral medications  Hypothyroidism -Check TSH, last reported check in Epic was in 2013 -Continue Synthroid at current dose for now  HTN -Hold Lisinopril and follow.  Schizophrenia -Continue home meds: Cogentin, Depakote, Lexapro, Invega, Trazodone -Lives in a group home  Bowel regimen: last BM 09/20/2015, bowel regimen initiated Diet: Clear liquid diet DVT Prophylaxis:mechanical compression device.  Advance goals of care discussion: full code  Family Communication: no family was present at bedside, at the time of interview.    Disposition:  Discharge to home. Expected discharge date: 09/26/2016, after EGD colonoscopy  Consultants: gastroenterology Procedures: IV  IRON  Antibiotics: Anti-infectives    None        Subjective: Doing better, no acute complaint: Abdominal pain. Complained about blood on wipes in the past.  Objective: Physical Exam: Vitals:   09/25/16 0540 09/25/16 0800 09/25/16 1229 09/25/16 1643  BP: (!) 168/72 (!) 165/81 (!) 155/73 (!) 160/73  Pulse: 81 79 81 80  Resp: 17 16 19 18   Temp: 98.1 F (36.7 C) 98.5 F (36.9 C) 98.8 F (37.1 C) 98 F (36.7 C)  TempSrc: Oral Oral Oral Oral  SpO2: 97% 97% 100% 98%    Intake/Output Summary (Last 24 hours) at 09/25/16 1658 Last data filed at 09/25/16 1031  Gross per 24 hour  Intake             2350 ml  Output                0 ml  Net             2350 ml   There were no vitals filed for this visit.  General: Alert, Awake and Oriented to Time, Place and Person. Appear in mild distress, affect appropriate Eyes: PERRL, Conjunctiva normal ENT: Oral Mucosa clear moist. Neck: difficult to assess JVD, no Abnormal Mass Or lumps Cardiovascular: S1 and S2 Present, no Murmur, Respiratory: Bilateral Air entry equal and Decreased, no use of accessory muscle, Clear to Auscultation, no Crackles, no wheezes Abdomen: Bowel Sound present, Soft and no tenderness Skin: no redness, no Rash, no induration Extremities: no Pedal edema, no calf tenderness Neurologic: Grossly no focal neuro deficit. Bilaterally Equal motor strength  Data Reviewed: CBC:  Recent Labs Lab 09/24/16 1540 09/24/16 1711 09/25/16 0957  WBC 10.2  --  8.6  NEUTROABS 7.5  --  6.3  HGB 3.4* 3.4* 8.2*  HCT 12.7* 12.7* 25.2*  MCV 66.1*  --  71.8*  PLT 674*  --  [id number]*   Basic Metabolic Panel:  Recent Labs Lab 09/24/16 1540 09/25/16 0957  NA 135 135  K 4.8 4.3  CL 101 100*  CO2 20* 27  GLUCOSE 231* 130*  BUN 19 16  CREATININE 1.59* 1.27*  CALCIUM 9.5 9.5    Liver Function Tests:  Recent Labs Lab 09/24/16 1540  AST 19  ALT 10*  ALKPHOS 34*  BILITOT 0.5  PROT 6.7  ALBUMIN 3.4*   No results for  input(s): LIPASE, AMYLASE in the last 168 hours. No results for input(s): AMMONIA in the last 168 hours. Coagulation Profile:  Recent Labs Lab 09/24/16 1725  INR 1.16   Cardiac Enzymes: No results for input(s): CKTOTAL, CKMB, CKMBINDEX, TROPONINI in the last 168 hours. BNP (last 3 results) No results for input(s): PROBNP in the last 8760 hours.  CBG:  Recent Labs Lab 09/24/16 2317 09/25/16 0506 09/25/16 0802 09/25/16 1210 09/25/16 1648  GLUCAP 176* 148* 154* 132* 120*    Studies: Ct Abdomen Pelvis Wo Contrast  Result Date: 09/25/2016 CLINICAL DATA:  Severe anemia. EXAM: CT ABDOMEN AND PELVIS WITHOUT CONTRAST TECHNIQUE: Multidetector CT imaging of the abdomen and pelvis was performed following the standard protocol without IV contrast. COMPARISON:  None. FINDINGS: Lower chest: Large hiatal hernia is noted. Visualized lung bases are unremarkable. Hepatobiliary: No focal liver abnormality is seen. Status post cholecystectomy. No biliary dilatation. Pancreas: Unremarkable. No pancreatic ductal dilatation or surrounding inflammatory changes. Spleen: Normal in size without focal abnormality. Adrenals/Urinary Tract: Adrenal glands are unremarkable. Bilateral nephrolithiasis is noted. No hydronephrosis or renal obstruction is noted. Urinary bladder is unremarkable. Stomach/Bowel: Large amount of stool is noted throughout the colon. There is no small bowel dilatation. Vascular/Lymphatic: Aortic atherosclerosis. No enlarged abdominal or pelvic lymph nodes. Reproductive: Uterus and bilateral adnexa are unremarkable. Other: No abdominal wall hernia or abnormality. No abdominopelvic ascites. Musculoskeletal: No acute or significant osseous findings. IMPRESSION: Large hiatal hernia. Nonobstructive bilateral nephrolithiasis. Aortic atherosclerosis. No other abnormality seen in the abdomen or pelvis. Electronically Signed   By: Marijo Conception, M.D.   On: 09/25/2016 12:12     Scheduled Meds: .  allopurinol  100 mg Oral BID  . atorvastatin  40 mg Oral QHS  . benztropine  2 mg Oral BID  . divalproex  1,000 mg Oral QHS  . escitalopram  20 mg Oral BH-q7a  . insulin aspart  0-15 Units Subcutaneous TID WC  . insulin aspart  0-5 Units Subcutaneous QHS  . latanoprost  1 drop Both Eyes QHS  . levothyroxine  88 mcg Oral QAC breakfast  . pantoprazole (PROTONIX) IV  40 mg Intravenous Q12H  . polyethylene glycol  17 g Oral Daily  . polyethylene glycol-electrolytes  4,000 mL Oral Once  . senna-docusate  1 tablet Oral BID  . traZODone  100 mg Oral QHS   Continuous Infusions: PRN Meds: acetaminophen **OR** acetaminophen, ondansetron **OR** ondansetron (ZOFRAN) IV  Time spent: 30 minutes  Author: Berle Mull, MD [facility] Pager: [phone] 09/25/2016 4:58 PM  If 7PM-7AM, please contact night-coverage at [email or web address], password [email or web address]

## 2016-09-25 NOTE — Care Management Note (Signed)
Case Management Note  Patient Details  Name: [name] MRN: [id number] Date of Birth: 1955/11/28  Subjective/Objective:                    Action/Plan:  Patient from [facility] . Has had transfusion , awaiting GI consult . Will continue to follow Expected Discharge Date:                  Expected Discharge Plan:  Group Home  In-House Referral:  Clinical Social Work  Discharge planning Services  CM Consult  Post Acute Care Choice:  Home Health Choice offered to:     DME Arranged:    DME Agency:     HH Arranged:    [place] Agency:     Status of Service:  In process, will continue to follow  If discussed at Long Length of Stay Meetings, dates discussed:    Additional Comments:  Marilu Favre, RN 09/25/2016, 10:46 AM

## 2016-09-25 NOTE — Progress Notes (Signed)
Pt completed transfusion of 4th unit of prbc, no s/s of transfusion reaction noted

## 2016-09-25 NOTE — Consult Note (Signed)
Reason for Consult: IDA Referring Physician: [facility]  Mindi Curling HPI: This is a 61 year old female with a PMH of DM, GERD, HTN, hyperlipidemia, Schizophrenia, Bipolar, and anemia admitted for symptomatic anemia.  She states that she was very sluggish and experienced DOE.  The symptoms started three weeks ago and it progressively worsened.  No complaints of chest pain or melena.  She believes she had some minor hematochezia several weeks ago.  Per her report, she underwent a colonoscopy 9 years ago in [facility] and an EGD for dysphagia at same time.  Her admission HGB was 3.4 g/dL, which is a significant decline from 11- 13 in 2016 and the years before.  Over the past month she was using 400 mg of ibuprofen QHS for complaints of back pain.  She has a history of GERD and she feels that it causes her some issues, but she denies taking any medication for her symptoms.  Past Medical History:  Diagnosis Date  . Anemia 09/25/2016  . Bipolar affective disorder ([place]) 04/29/2012  . Borderline personality disorder   . Chronic back pain   . Depression   . Diabetes mellitus ([place]) 04/29/2012  . GERD (gastroesophageal reflux disease)   . HTN (hypertension) 04/29/2012  . Hyperlipidemia   . Hypothyroidism   . Schizoaffective disorder ([place]) 04/29/2012  . Schizophrenia [facility])     Past Surgical History:  Procedure Laterality Date  . APPENDECTOMY    . CHOLECYSTECTOMY    . KNEE SURGERY     right    Family History  Problem Relation Age of Onset  . CVA Mother 63  . Dementia Father 44  . CAD Maternal Grandmother   . Colon cancer Maternal Grandfather     Social History:  reports that she quit smoking about 10 years ago. She has never used smokeless tobacco. She reports that she does not drink alcohol or use drugs.  Allergies: No Known Allergies  Medications:  Scheduled: . allopurinol  100 mg Oral BID  . atorvastatin  40 mg Oral QHS  . benztropine  2 mg Oral BID  . divalproex  1,000 mg  Oral QHS  . escitalopram  20 mg Oral BH-q7a  . ferumoxytol  510 mg Intravenous Once  . insulin aspart  0-15 Units Subcutaneous TID WC  . insulin aspart  0-5 Units Subcutaneous QHS  . latanoprost  1 drop Both Eyes QHS  . levothyroxine  88 mcg Oral QAC breakfast  . pantoprazole (PROTONIX) IV  40 mg Intravenous Q12H  . polyethylene glycol  17 g Oral Daily  . senna-docusate  1 tablet Oral BID  . traZODone  100 mg Oral QHS   Continuous:   Results for orders placed or performed during the hospital encounter of 09/24/16 (from the past 24 hour(s))  CBC with Differential     Status: Abnormal   Collection Time: 09/24/16  3:40 PM  Result Value Ref Range   WBC 10.2 4.0 - 10.5 K/uL   RBC 1.92 (L) 3.87 - 5.11 MIL/uL   Hemoglobin 3.4 (LL) 12.0 - 15.0 g/dL   HCT 12.7 (L) 36.0 - 46.0 %   MCV 66.1 (L) 78.0 - 100.0 fL   MCH 17.7 (L) 26.0 - 34.0 pg   MCHC 26.8 (L) 30.0 - 36.0 g/dL   RDW 17.5 (H) 11.5 - 15.5 %   Platelets 674 (H) 150 - 400 K/uL   Neutrophils Relative % 73 %   Lymphocytes Relative 21 %   Monocytes Relative  6 %   Eosinophils Relative 0 %   Basophils Relative 0 %   Neutro Abs 7.5 1.7 - 7.7 K/uL   Lymphs Abs 2.1 0.7 - 4.0 K/uL   Monocytes Absolute 0.6 0.1 - 1.0 K/uL   Eosinophils Absolute 0.0 0.0 - 0.7 K/uL   Basophils Absolute 0.0 0.0 - 0.1 K/uL   RBC Morphology ELLIPTOCYTES    Smear Review PLATELETS APPEAR INCREASED   Type and screen [place]     Status: None (Preliminary result)   Collection Time: 09/24/16  3:40 PM  Result Value Ref Range   ISSUE DATE / TIME [id number]    Blood Product Unit Number [id number]    PRODUCT CODE Y751056    Unit Type and Rh [id number]    Blood Product Expiration Date [id number]    ISSUE DATE / TIME S876253    Blood Product Unit Number [id number]    PRODUCT CODE Y751056    Unit Type and Rh [id number]    Blood Product Expiration Date N586344    ISSUE DATE / TIME [id number]    Blood Product Unit Number  [id number]    PRODUCT CODE Y751056    Unit Type and Rh [id number]    Blood Product Expiration Date N586344    ISSUE DATE / TIME [id number]    Blood Product Unit Number [id number]    PRODUCT CODE E0336V00    Unit Type and Rh 6200    Blood Product Expiration Date N586344   Comprehensive metabolic panel     Status: Abnormal   Collection Time: 09/24/16  3:40 PM  Result Value Ref Range   Sodium 135 135 - 145 mmol/L   Potassium 4.8 3.5 - 5.1 mmol/L   Chloride 101 101 - 111 mmol/L   CO2 20 (L) 22 - 32 mmol/L   Glucose, Bld 231 (H) 65 - 99 mg/dL   BUN 19 6 - 20 mg/dL   Creatinine, Ser 1.59 (H) 0.44 - 1.00 mg/dL   Calcium 9.5 8.9 - 10.3 mg/dL   Total Protein 6.7 6.5 - 8.1 g/dL   Albumin 3.4 (L) 3.5 - 5.0 g/dL   AST 19 15 - 41 U/L   ALT 10 (L) 14 - 54 U/L   Alkaline Phosphatase 34 (L) 38 - 126 U/L   Total Bilirubin 0.5 0.3 - 1.2 mg/dL   GFR calc non Af Amer 35 (L) >60 mL/min   GFR calc Af Amer 40 (L) >60 mL/min   Anion gap 14 5 - 15  ABO/Rh     Status: None   Collection Time: 09/24/16  3:40 PM  Result Value Ref Range   ABO/RH(D) A POS   Hemoglobin and hematocrit, blood     Status: Abnormal   Collection Time: 09/24/16  5:11 PM  Result Value Ref Range   Hemoglobin 3.4 (LL) 12.0 - 15.0 g/dL   HCT 12.7 (L) 36.0 - 46.0 %  APTT     Status: None   Collection Time: 09/24/16  5:25 PM  Result Value Ref Range   aPTT 27 24 - 36 seconds  Protime-INR     Status: None   Collection Time: 09/24/16  5:25 PM  Result Value Ref Range   Prothrombin Time 14.9 11.4 - 15.2 seconds   INR 1.16   Vitamin B12     Status: None   Collection Time: 09/24/16  5:25 PM  Result Value Ref Range   Vitamin B-12 573 180 - 914 pg/mL  Iron and TIBC  Status: Abnormal   Collection Time: 09/24/16  5:25 PM  Result Value Ref Range   Iron 6 (L) 28 - 170 ug/dL   TIBC 594 (H) 250 - 450 ug/dL   Saturation Ratios 1 (L) 10.4 - 31.8 %   UIBC 588 ug/dL  Ferritin     Status: Abnormal   Collection Time:  09/24/16  5:25 PM  Result Value Ref Range   Ferritin 2 (L) 11 - 307 ng/mL  Reticulocytes     Status: Abnormal   Collection Time: 09/24/16  5:25 PM  Result Value Ref Range   Retic Ct Pct 2.5 0.4 - 3.1 %   RBC. 1.98 (L) 3.87 - 5.11 MIL/uL   Retic Count, Manual 49.5 19.0 - 186.0 K/uL  POC occult blood, ED     Status: None   Collection Time: 09/24/16  5:43 PM  Result Value Ref Range   Fecal Occult Bld NEGATIVE NEGATIVE  Prepare RBC     Status: None   Collection Time: 09/24/16  5:45 PM  Result Value Ref Range   Order Confirmation ORDER PROCESSED BY BLOOD BANK   Folate     Status: None   Collection Time: 09/24/16  6:09 PM  Result Value Ref Range   Folate 51.3 >5.9 ng/mL  Prepare RBC     Status: None   Collection Time: 09/24/16  9:29 PM  Result Value Ref Range   Order Confirmation ORDER PROCESSED BY BLOOD BANK   Glucose, capillary     Status: Abnormal   Collection Time: 09/24/16 11:17 PM  Result Value Ref Range   Glucose-Capillary 176 (H) 65 - 99 mg/dL  MRSA PCR Screening     Status: None   Collection Time: 09/24/16 11:25 PM  Result Value Ref Range   MRSA by PCR NEGATIVE NEGATIVE  Glucose, capillary     Status: Abnormal   Collection Time: 09/25/16  5:06 AM  Result Value Ref Range   Glucose-Capillary 148 (H) 65 - 99 mg/dL  Glucose, capillary     Status: Abnormal   Collection Time: 09/25/16  8:02 AM  Result Value Ref Range   Glucose-Capillary 154 (H) 65 - 99 mg/dL   Comment 1 Notify RN   CBC with Differential/Platelet     Status: Abnormal   Collection Time: 09/25/16  9:57 AM  Result Value Ref Range   WBC 8.6 4.0 - 10.5 K/uL   RBC 3.51 (L) 3.87 - 5.11 MIL/uL   Hemoglobin 8.2 (L) 12.0 - 15.0 g/dL   HCT 25.2 (L) 36.0 - 46.0 %   MCV 71.8 (L) 78.0 - 100.0 fL   MCH 23.4 (L) 26.0 - 34.0 pg   MCHC 32.5 30.0 - 36.0 g/dL   RDW 19.1 (H) 11.5 - 15.5 %   Platelets 503 (H) 150 - 400 K/uL   Neutrophils Relative % 74 %   Neutro Abs 6.3 1.7 - 7.7 K/uL   Lymphocytes Relative 13 %    Lymphs Abs 1.1 0.7 - 4.0 K/uL   Monocytes Relative 11 %   Monocytes Absolute 0.9 0.1 - 1.0 K/uL   Eosinophils Relative 2 %   Eosinophils Absolute 0.2 0.0 - 0.7 K/uL   Basophils Relative 1 %   Basophils Absolute 0.1 0.0 - 0.1 K/uL  Basic metabolic panel     Status: Abnormal   Collection Time: 09/25/16  9:57 AM  Result Value Ref Range   Sodium 135 135 - 145 mmol/L   Potassium 4.3 3.5 - 5.1 mmol/L   Chloride  100 (L) 101 - 111 mmol/L   CO2 27 22 - 32 mmol/L   Glucose, Bld 130 (H) 65 - 99 mg/dL   BUN 16 6 - 20 mg/dL   Creatinine, Ser 1.27 (H) 0.44 - 1.00 mg/dL   Calcium 9.5 8.9 - 10.3 mg/dL   GFR calc non Af Amer 46 (L) >60 mL/min   GFR calc Af Amer 53 (L) >60 mL/min   Anion gap 8 5 - 15  Glucose, capillary     Status: Abnormal   Collection Time: 09/25/16 12:10 PM  Result Value Ref Range   Glucose-Capillary 132 (H) 65 - 99 mg/dL   Comment 1 Notify RN    Comment 2 Document in Chart   BLOOD TRANSFUSION REPORT - SCANNED     Status: None   Collection Time: 09/25/16 12:39 PM   Narrative   Ordered by an unspecified provider.     Ct Abdomen Pelvis Wo Contrast  Result Date: 09/25/2016 CLINICAL DATA:  Severe anemia. EXAM: CT ABDOMEN AND PELVIS WITHOUT CONTRAST TECHNIQUE: Multidetector CT imaging of the abdomen and pelvis was performed following the standard protocol without IV contrast. COMPARISON:  None. FINDINGS: Lower chest: Large hiatal hernia is noted. Visualized lung bases are unremarkable. Hepatobiliary: No focal liver abnormality is seen. Status post cholecystectomy. No biliary dilatation. Pancreas: Unremarkable. No pancreatic ductal dilatation or surrounding inflammatory changes. Spleen: Normal in size without focal abnormality. Adrenals/Urinary Tract: Adrenal glands are unremarkable. Bilateral nephrolithiasis is noted. No hydronephrosis or renal obstruction is noted. Urinary bladder is unremarkable. Stomach/Bowel: Large amount of stool is noted throughout the colon. There is no small  bowel dilatation. Vascular/Lymphatic: Aortic atherosclerosis. No enlarged abdominal or pelvic lymph nodes. Reproductive: Uterus and bilateral adnexa are unremarkable. Other: No abdominal wall hernia or abnormality. No abdominopelvic ascites. Musculoskeletal: No acute or significant osseous findings. IMPRESSION: Large hiatal hernia. Nonobstructive bilateral nephrolithiasis. Aortic atherosclerosis. No other abnormality seen in the abdomen or pelvis. Electronically Signed   By: Marijo Conception, M.D.   On: 09/25/2016 12:12    ROS:  As stated above in the HPI otherwise negative.  Blood pressure (!) 155/73, pulse 81, temperature 98.8 F (37.1 C), temperature source Oral, resp. rate 19, SpO2 100 %.    PE: Gen: NAD, Alert and Oriented HEENT:  [place]/AT, EOMI Neck: Supple, no LAD Lungs: CTA Bilaterally CV: RRR without M/G/R ABM: Soft, NTND, +BS Ext: No C/C/E  Assessment/Plan: 1) Severe IDA. 2) NSAIDS use. 3) GERD. 4) Multiple medical problems.   Her symptoms may be as a result of her NSAID use.  She also has a long history of GERD and she states that she required dilation in the past.  I will evaluate her further with an EGD/Colonoscopy tomorrow.  Her HGB increased appropriately with blood transfusions and she is now receiving an iron infusion.  Plan: 1) EGD/Colonoscopy tomorrow.  Butch Otterson D 09/25/2016, 3:35 PM

## 2016-09-26 ENCOUNTER — Encounter (HOSPITAL_COMMUNITY)
Admission: EM | Disposition: A | Discharge status: Home Health | Payer: Self-pay | Source: Home / Self Care | Attending: Internal Medicine

## 2016-09-26 ENCOUNTER — Encounter (HOSPITAL_COMMUNITY): Payer: Self-pay

## 2016-09-26 DIAGNOSIS — D649 Anemia, unspecified: Secondary | ICD-10-CM

## 2016-09-26 HISTORY — PX: ESOPHAGOGASTRODUODENOSCOPY: SHX5428

## 2016-09-26 LAB — OCCULT BLOOD X 1 CARD TO LAB, STOOL
Fecal Occult Bld: NEGATIVE
Fecal Occult Bld: POSITIVE — AB

## 2016-09-26 LAB — GLUCOSE, CAPILLARY
Glucose-Capillary: 100 mg/dL — ABNORMAL HIGH (ref 65–99)
Glucose-Capillary: 124 mg/dL — ABNORMAL HIGH (ref 65–99)
Glucose-Capillary: 126 mg/dL — ABNORMAL HIGH (ref 65–99)
Glucose-Capillary: 130 mg/dL — ABNORMAL HIGH (ref 65–99)
Glucose-Capillary: 148 mg/dL — ABNORMAL HIGH (ref 65–99)
Glucose-Capillary: 84 mg/dL (ref 65–99)

## 2016-09-26 LAB — TSH: TSH: 2.384 u[IU]/mL (ref 0.350–4.500)

## 2016-09-26 LAB — TYPE AND SCREEN
ABO/RH(D): A POS
Antibody Screen: NEGATIVE
Unit division: 0
Unit division: 0
Unit division: 0
Unit division: 0

## 2016-09-26 LAB — CBC WITH DIFFERENTIAL/PLATELET
Basophils Absolute: 0.1 10*3/uL (ref 0.0–0.1)
Basophils Relative: 1 %
Eosinophils Absolute: 0.1 10*3/uL (ref 0.0–0.7)
Eosinophils Relative: 1 %
HCT: 24.1 % — ABNORMAL LOW (ref 36.0–46.0)
Hemoglobin: 7.8 g/dL — ABNORMAL LOW (ref 12.0–15.0)
Lymphocytes Relative: 17 %
Lymphs Abs: 1.7 10*3/uL (ref 0.7–4.0)
MCH: 23.3 pg — ABNORMAL LOW (ref 26.0–34.0)
MCHC: 32.4 g/dL (ref 30.0–36.0)
MCV: 71.9 fL — ABNORMAL LOW (ref 78.0–100.0)
Monocytes Absolute: 1.4 10*3/uL — ABNORMAL HIGH (ref 0.1–1.0)
Monocytes Relative: 13 %
Neutro Abs: 7 10*3/uL (ref 1.7–7.7)
Neutrophils Relative %: 68 %
Platelets: 468 10*3/uL — ABNORMAL HIGH (ref 150–400)
RBC: 3.35 MIL/uL — ABNORMAL LOW (ref 3.87–5.11)
RDW: 19.2 % — ABNORMAL HIGH (ref 11.5–15.5)
WBC: 10.3 10*3/uL (ref 4.0–10.5)

## 2016-09-26 LAB — COMPREHENSIVE METABOLIC PANEL
ALT: 11 U/L — ABNORMAL LOW (ref 14–54)
AST: 16 U/L (ref 15–41)
Albumin: 3.1 g/dL — ABNORMAL LOW (ref 3.5–5.0)
Alkaline Phosphatase: 37 U/L — ABNORMAL LOW (ref 38–126)
Anion gap: 8 (ref 5–15)
BUN: 10 mg/dL (ref 6–20)
CO2: 25 mmol/L (ref 22–32)
Calcium: 9.2 mg/dL (ref 8.9–10.3)
Chloride: 104 mmol/L (ref 101–111)
Creatinine, Ser: 1.14 mg/dL — ABNORMAL HIGH (ref 0.44–1.00)
GFR calc Af Amer: >60 mL/min (ref >60)
GFR calc non Af Amer: 52 mL/min — ABNORMAL LOW (ref >60)
Glucose, Bld: 124 mg/dL — ABNORMAL HIGH (ref 65–99)
Potassium: 4.3 mmol/L (ref 3.5–5.1)
Sodium: 137 mmol/L (ref 135–145)
Total Bilirubin: 0.6 mg/dL (ref 0.3–1.2)
Total Protein: 6.1 g/dL — ABNORMAL LOW (ref 6.5–8.1)

## 2016-09-26 LAB — HEMOGLOBIN A1C
Hgb A1c MFr Bld: 5.9 % — ABNORMAL HIGH (ref 4.8–5.6)
Mean Plasma Glucose: 123 mg/dL

## 2016-09-26 LAB — T4, FREE: Free T4: 1.25 ng/dL — ABNORMAL HIGH (ref 0.61–1.12)

## 2016-09-26 LAB — PROTIME-INR
INR: 1.1
Prothrombin Time: 14.2 seconds (ref 11.4–15.2)

## 2016-09-26 SURGERY — EGD (ESOPHAGOGASTRODUODENOSCOPY)
Anesthesia: Moderate Sedation

## 2016-09-26 MED ORDER — LIDOCAINE VISCOUS 2 % MT SOLN
OROMUCOSAL | Status: AC
  Filled 2016-09-26: qty 15

## 2016-09-26 MED ORDER — LIDOCAINE VISCOUS 2 % MT SOLN
OROMUCOSAL | Status: DC | PRN
  Administered 2016-09-26: 20 mL via OROMUCOSAL

## 2016-09-26 MED ORDER — FENTANYL CITRATE (PF) 100 MCG/2ML IJ SOLN
INTRAMUSCULAR | Status: AC
  Filled 2016-09-26: qty 2

## 2016-09-26 MED ORDER — MIDAZOLAM HCL 5 MG/ML IJ SOLN
INTRAMUSCULAR | Status: AC
Start: 2016-09-26 — End: 2016-09-26
  Filled 2016-09-26: qty 2

## 2016-09-26 MED ORDER — MIDAZOLAM HCL 10 MG/2ML IJ SOLN
INTRAMUSCULAR | Status: DC | PRN
  Administered 2016-09-26: 2 mg via INTRAVENOUS

## 2016-09-26 MED ORDER — DIPHENHYDRAMINE HCL 50 MG/ML IJ SOLN
INTRAMUSCULAR | Status: AC
  Filled 2016-09-26: qty 1

## 2016-09-26 MED ORDER — PEG 3350-KCL-NA BICARB-NACL 420 G PO SOLR
4000.0000 mL | Freq: Once | ORAL | Status: AC
  Administered 2016-09-26: 4000 mL via ORAL
  Filled 2016-09-26: qty 4000

## 2016-09-26 MED ORDER — FENTANYL CITRATE (PF) 100 MCG/2ML IJ SOLN
INTRAMUSCULAR | Status: DC | PRN
  Administered 2016-09-26 (×2): 25 ug via INTRAVENOUS

## 2016-09-26 NOTE — Progress Notes (Signed)
Patient's BP in pre-op was 214/96. She was sort of laying on the side of her cuff. I had her repositioned to her back and also repositioned the cuff on her arm. Second BP was 182/71. She does not currently take any blood pressure medications. Dr. Collene Mares was made aware of this and she has agreed to continue with procedures.

## 2016-09-26 NOTE — Progress Notes (Signed)
Pt completed bowel prep last night but stools still brown and unclear. Stool consistency is liquid but still very brown

## 2016-09-26 NOTE — Op Note (Signed)
[facility] Patient Name: [name] Procedure Date : 09/26/2016 MRN: [id number] Attending MD: Juanita Craver , MD Date of Birth: Nov 03, 1955 CSN: [id number] Age: 61 Admit Type: Inpatient Procedure:                EGD with biopsies. Indications:              Iron deficiency anemia, Hematochezia,                            Gastro-esophageal reflux disease. Providers:                Juanita Craver, MD, Cleda Daub, RN, Cherylynn Ridges,                            Technician Referring MD:              Medicines:                Fentanyl 50 micrograms & Midazolam 2 mg IV &                            Viscous Lidocaine 20 cc PO. Complications:            No immediate complications. Estimated Blood Loss:     Estimated blood loss was minimal. Procedure:                Pre-anesthesia assessment: - Prior to the                            procedure, a history and physical was performed,                            and patient medications and allergies were                            reviewed. The patient's tolerance of previous                            anesthesia was also reviewed. The risks and                            benefits of the procedure and the sedation options                            and risks were discussed with the patient. All                            questions were answered, and informed consent was                            obtained. Prior anticoagulants: The patient has                            taken no previous anticoagulant or antiplatelet  agents. ASA Grade Assessment: III - A patient with                            severe systemic disease. After reviewing the risks                            and benefits, the patient was deemed in                            satisfactory condition to undergo the procedure.                            After obtaining informed consent, the endoscope was                            passed under direct  vision. Throughout the                            procedure, the patient's blood pressure, pulse, and                            oxygen saturations were monitored continuously. The                            [id number]) scope was introduced through the                            mouth, and advanced to the second part of duodenum.                            The EGD was accomplished without difficulty. The                            patient tolerated the procedure well. Scope In: Scope Out: Findings:      The examined esophagus and GEJ appeared widely patent and normal.      A few sessile polyps were found on the greater curvature of the       stomach-biopsies were taken with a cold forceps for histology.      Prominent gastric fold were found in the gastric antrum-biopsies were       taken with a cold forceps for histology.      A small hiatal hernia was noted on retroflexion.      The examined duodenum was normal. Impression:               - Normal appearing, widely patent esophagus and GEJ.                           - A few small sessile gastric polyps along the                            GC-biopsied for pathology.                           - Enlarged  gastric fold in the antrum-biopsied for                            pathology.                           - Small hiatal hernia noted on retroflexion.                           - Normal examined duodenum. Moderate Sedation:      Moderate (conscious) sedation was administered by the endoscopy nurse       and supervised by the endoscopist. The following parameters were       monitored: oxygen saturation, heart rate, blood pressure, and response       to care. Total physician intraservice time was 10 minutes. Recommendation:           - Clear liquid diet today.                           - Continue present medications.                           - Reprep with Nulytely tonight for a colonoscopy                             tomorrow. Procedure Code(s):        --- Professional ---                           [phone], Esophagogastroduodenoscopy, flexible,                            transoral; with biopsy, single or multiple Diagnosis Code(s):        --- Professional ---                           D50.9, Iron deficiency anemia, unspecified                           K92.1, Melena (includes Hematochezia)                           K21.9, Gastro-esophageal reflux disease without                            esophagitis                           K44.9, Diaphragmatic hernia without obstruction or                            gangrene                           K29.60, Other gastritis without bleeding                           K31.7, Polyp of stomach and duodenum CPT copyright 2016 American  Medical Association. All rights reserved. The codes documented in this report are preliminary and upon coder review may  be revised to meet current compliance requirements. Juanita Craver, MD Juanita Craver, MD 09/26/2016 3:42:47 PM This report has been signed electronically. Number of Addenda: 0

## 2016-09-26 NOTE — Progress Notes (Signed)
Pt's sister Suanne Marker is asking for the endoscopy MD to give her a call tomorrow to give her an update on the results of the EGD done today and the colonoscopy scheduled for tomorrow.her call back number is [phone]

## 2016-09-26 NOTE — Progress Notes (Signed)
[facility] Progress Note  Patient: [name] [id number]   PCP: Leonard Downing, MD DOB: [birth year]   DOA: 09/24/2016   DOS: 09/26/2016   Date of Service: the patient was seen and examined on 09/26/2016  Brief hospital course: Pt. with PMH of Schizophrenia, hypothyroidism, HTN, type II DM; admitted on 09/24/2016, with complaint of fatigue and tiredness with near syncope, was found to have symptomatic anemia with hemoglobin of 3.2. Currently further plan is further workup.  Assessment and Plan:  Severe iron deficiency anemia  -Iron 6, ferritin 2. Normal 0000000 and folic acid. -CT abdomen and pelvis without any evidence of retroperitoneal bleed or uterine mass. -hemoccult positive -GI Dr.Hung consulting, plan for EGD +/- colonoscopy today - s/p IV iron, PO iron at dc -s/p 4units PRBC  AKI -Uncertain baseline creatinine since it has not been reported in Epic since 2/16. -Creatinine 1.59 with normal BUN (1.06 in 2/16) -resolved with IV hydration  DM -Last reported A1c was in 2013 so will recheck -Cover with SSI for now and hold oral medications  Hypothyroidism -Check TSH, last reported check in Epic was in 2013 -Continue Synthroid at current dose for now  HTN -Hold Lisinopril and follow.  Schizophrenia -Continue home meds: Cogentin, Depakote, Lexapro, Invega, Trazodone -Lives in a group home  DVT Prophylaxis:mechanical compression device.  Advance goals of care discussion: full code  Family Communication: no family was present at bedside, at the time of interview.    Disposition: back to group home after workup completed  Consultants: gastroenterology Procedures: IV IRON  Antibiotics: Anti-infectives    None        Subjective:  Feels ok, energy improved  Objective: Physical Exam: Vitals:   09/25/16 1643 09/25/16 1732 09/25/16 2254 09/26/16 0445  BP: (!) 160/73 (!) 164/78 (!) 130/53 140/60  Pulse: 80 80 74 89  Resp: 18 14 17 18   Temp: 98  F (36.7 C) 98.1 F (36.7 C) 98.8 F (37.1 C) 99.2 F (37.3 C)  TempSrc: Oral Oral Oral Oral  SpO2: 98% 100% 94% 96%    Intake/Output Summary (Last 24 hours) at 09/26/16 1301 Last data filed at 09/26/16 1100  Gross per 24 hour  Intake              480 ml  Output                0 ml  Net              480 ml   There were no vitals filed for this visit.  General: Alert, Awake and Oriented to Time, Place and Person. Appear in mild distress, affect appropriate Eyes: PERRL, Conjunctiva normal ENT: Oral Mucosa clear moist. Neck: difficult to assess JVD, no Abnormal Mass Or lumps Cardiovascular: S1 and S2 Present, no Murmur, Respiratory: Bilateral Air entry equal and Decreased, no use of accessory muscle, Clear to Auscultation, no Crackles, no wheezes Abdomen: Bowel Sound present, Soft and no tenderness Skin: no redness, no Rash, no induration Extremities: no Pedal edema, no calf tenderness Neurologic: Grossly no focal neuro deficit. Bilaterally Equal motor strength  Data Reviewed: CBC:  Recent Labs Lab 09/24/16 1540 09/24/16 1711 09/25/16 0957 09/26/16 0416  WBC 10.2  --  8.6 10.3  NEUTROABS 7.5  --  6.3 7.0  HGB 3.4* 3.4* 8.2* 7.8*  HCT 12.7* 12.7* 25.2* 24.1*  MCV 66.1*  --  71.8* 71.9*  PLT 674*  --  503* [id number]*   Basic Metabolic Panel:  Recent Labs  Lab 09/24/16 1540 09/25/16 0957 09/26/16 0416  NA 135 135 137  K 4.8 4.3 4.3  CL 101 100* 104  CO2 20* 27 25  GLUCOSE 231* 130* 124*  BUN 19 16 10   CREATININE 1.59* 1.27* 1.14*  CALCIUM 9.5 9.5 9.2    Liver Function Tests:  Recent Labs Lab 09/24/16 1540 09/26/16 0416  AST 19 16  ALT 10* 11*  ALKPHOS 34* 37*  BILITOT 0.5 0.6  PROT 6.7 6.1*  ALBUMIN 3.4* 3.1*   No results for input(s): LIPASE, AMYLASE in the last 168 hours. No results for input(s): AMMONIA in the last 168 hours. Coagulation Profile:  Recent Labs Lab 09/24/16 1725 09/26/16 0416  INR 1.16 1.10   Cardiac Enzymes: No results for  input(s): CKTOTAL, CKMB, CKMBINDEX, TROPONINI in the last 168 hours. BNP (last 3 results) No results for input(s): PROBNP in the last 8760 hours.  CBG:  Recent Labs Lab 09/25/16 2311 09/26/16 0034 09/26/16 0416 09/26/16 0807 09/26/16 1228  GLUCAP 102* 126* 124* 130* 148*    Studies: No results found.   Scheduled Meds: . allopurinol  100 mg Oral BID  . atorvastatin  40 mg Oral QHS  . benztropine  2 mg Oral BID  . divalproex  1,000 mg Oral QHS  . escitalopram  20 mg Oral BH-q7a  . insulin aspart  0-15 Units Subcutaneous TID WC  . insulin aspart  0-5 Units Subcutaneous QHS  . latanoprost  1 drop Both Eyes QHS  . levothyroxine  88 mcg Oral QAC breakfast  . pantoprazole (PROTONIX) IV  40 mg Intravenous Q12H  . polyethylene glycol  17 g Oral Daily  . senna-docusate  1 tablet Oral BID  . traZODone  100 mg Oral QHS   Continuous Infusions: . sodium chloride 20 mL/hr at 09/25/16 1828   PRN Meds: acetaminophen **OR** acetaminophen, ondansetron **OR** ondansetron (ZOFRAN) IV  Time spent: 30 minutes  Author: Domenic Polite, MD [facility] Pager: [phone] 09/26/2016 1:01 PM  If 7PM-7AM, please contact night-coverage at [email or web address], password [facility]

## 2016-09-26 NOTE — Progress Notes (Signed)
Pt had a medium sized bowel movement.stools remain watery but still brown

## 2016-09-27 ENCOUNTER — Inpatient Hospital Stay (HOSPITAL_COMMUNITY): Payer: Medicare Other | Admitting: Certified Registered Nurse Anesthetist

## 2016-09-27 ENCOUNTER — Encounter (HOSPITAL_COMMUNITY)
Admission: EM | Disposition: A | Discharge status: Home Health | Payer: Self-pay | Source: Home / Self Care | Attending: Internal Medicine

## 2016-09-27 ENCOUNTER — Encounter (HOSPITAL_COMMUNITY): Payer: Self-pay | Admitting: Certified Registered Nurse Anesthetist

## 2016-09-27 ENCOUNTER — Ambulatory Visit: Payer: Self-pay

## 2016-09-27 HISTORY — PX: COLONOSCOPY: SHX5424

## 2016-09-27 HISTORY — PX: GIVENS CAPSULE STUDY: SHX5432

## 2016-09-27 LAB — GLUCOSE, CAPILLARY
Glucose-Capillary: 125 mg/dL — ABNORMAL HIGH (ref 65–99)
Glucose-Capillary: 132 mg/dL — ABNORMAL HIGH (ref 65–99)
Glucose-Capillary: 117 mg/dL — ABNORMAL HIGH (ref 65–99)
Glucose-Capillary: 201 mg/dL — ABNORMAL HIGH (ref 65–99)

## 2016-09-27 LAB — CBC
HCT: 26.9 % — ABNORMAL LOW (ref 36.0–46.0)
Hemoglobin: 8.3 g/dL — ABNORMAL LOW (ref 12.0–15.0)
MCH: 23 pg — ABNORMAL LOW (ref 26.0–34.0)
MCHC: 30.9 g/dL (ref 30.0–36.0)
MCV: 74.5 fL — ABNORMAL LOW (ref 78.0–100.0)
Platelets: 452 10*3/uL — ABNORMAL HIGH (ref 150–400)
RBC: 3.61 MIL/uL — ABNORMAL LOW (ref 3.87–5.11)
RDW: 20.6 % — ABNORMAL HIGH (ref 11.5–15.5)
WBC: 11.7 10*3/uL — ABNORMAL HIGH (ref 4.0–10.5)

## 2016-09-27 LAB — BASIC METABOLIC PANEL
Anion gap: 9 (ref 5–15)
BUN: <5 mg/dL — ABNORMAL LOW (ref 6–20)
CO2: 24 mmol/L (ref 22–32)
Calcium: 9.2 mg/dL (ref 8.9–10.3)
Chloride: 105 mmol/L (ref 101–111)
Creatinine, Ser: 1.05 mg/dL — ABNORMAL HIGH (ref 0.44–1.00)
GFR calc Af Amer: >60 mL/min (ref >60)
GFR calc non Af Amer: 57 mL/min — ABNORMAL LOW (ref >60)
Glucose, Bld: 128 mg/dL — ABNORMAL HIGH (ref 65–99)
Potassium: 3.9 mmol/L (ref 3.5–5.1)
Sodium: 138 mmol/L (ref 135–145)

## 2016-09-27 SURGERY — COLONOSCOPY
Anesthesia: Monitor Anesthesia Care

## 2016-09-27 SURGERY — IMAGING PROCEDURE, GI TRACT, INTRALUMINAL, VIA CAPSULE
Anesthesia: LOCAL

## 2016-09-27 MED ORDER — PROPOFOL 500 MG/50ML IV EMUL
INTRAVENOUS | Status: DC | PRN
  Administered 2016-09-27: 75 ug/kg/min via INTRAVENOUS

## 2016-09-27 MED ORDER — LACTATED RINGERS IV SOLN
INTRAVENOUS | Status: DC | PRN
  Administered 2016-09-27: 13:00:00 via INTRAVENOUS

## 2016-09-27 MED ORDER — PROPOFOL 10 MG/ML IV BOLUS
INTRAVENOUS | Status: DC | PRN
  Administered 2016-09-27: 20 mg via INTRAVENOUS

## 2016-09-27 NOTE — Progress Notes (Addendum)
Kept pt NPO, for Colonoscopy today. Had clear loose stools last night after a bowel prep.  1253 Pt signed consent for Colonoscopy,  to Endo via wheelchair. 1530 Received pt from Endo , A&O x4. NPO maint. Pt had endoscopy capsule. Abd sensor in place.

## 2016-09-27 NOTE — Anesthesia Preprocedure Evaluation (Signed)
Anesthesia Evaluation  Patient identified by MRN, date of birth, ID band Patient awake    Reviewed: Allergy & Precautions, NPO status , Patient's Chart, lab work & pertinent test results  Airway Mallampati: II  TM Distance: >3 FB Neck ROM: Full    Dental no notable dental hx. (+) Dental Advisory Given   Pulmonary former smoker,    Pulmonary exam normal        Cardiovascular hypertension, Pt. on medications negative cardio ROS Normal cardiovascular exam     Neuro/Psych Depression Bipolar Disorder Schizophrenia negative neurological ROS     GI/Hepatic Neg liver ROS,   Endo/Other  negative endocrine ROSdiabetes  Renal/GU Renal InsufficiencyRenal disease  negative genitourinary   Musculoskeletal negative musculoskeletal ROS (+)   Abdominal   Peds negative pediatric ROS (+)  Hematology negative hematology ROS (+)   Anesthesia Other Findings   Reproductive/Obstetrics negative OB ROS                             Anesthesia Physical Anesthesia Plan  ASA: III  Anesthesia Plan: MAC   Post-op Pain Management:    Induction:   Airway Management Planned: Natural Airway  Additional Equipment:   Intra-op Plan:   Post-operative Plan:   Informed Consent: I have reviewed the patients History and Physical, chart, labs and discussed the procedure including the risks, benefits and alternatives for the proposed anesthesia with the patient or authorized representative who has indicated his/her understanding and acceptance.   Dental advisory given  Plan Discussed with: CRNA and Anesthesiologist  Anesthesia Plan Comments:         Anesthesia Quick Evaluation

## 2016-09-27 NOTE — Progress Notes (Signed)
[facility] Progress Note  Patient: [name] [id number]   PCP: Leonard Downing, MD DOB: 06-18-1956   DOA: 09/24/2016   DOS: 09/27/2016   Date of Service: the patient was seen and examined on 09/27/2016  Brief hospital course: Pt. with PMH of Schizophrenia, hypothyroidism, HTN, type II DM; admitted on 09/24/2016, with complaint of fatigue and tiredness with near syncope, was found to have symptomatic anemia with hemoglobin of 3.2. Currently further plan is further workup.  Assessment and Plan:  Severe iron deficiency anemia  -Iron 6, ferritin 2. Normal 0000000 and folic acid. -CT abdomen and pelvis without any evidence of retroperitoneal bleed or uterine mass. -hemoccult positive -GI Dr.Hung consulting,  EGD: gastric polyps, plan for colonoscopy today -s/p IV iron, PO iron at dc -s/p 4units PRBC -Hb stable now, monitor  AKI -Uncertain baseline creatinine since it has not been reported in Epic since 2/16. -Creatinine 1.59 with normal BUN (1.06 in 2/16) -resolved with IV hydration  DM -hba1c is 5.9 -Cover with SSI ,  holding oral medications  Hypothyroidism -TSh stable -Continue Synthroid at current dose for now  HTN -Hold Lisinopril and follow.  Schizophrenia -Continue home meds: Cogentin, Depakote, Lexapro, Invega, Trazodone -Lives in a group home  DVT Prophylaxis:mechanical compression device.  Advance goals of care discussion: full code  Family Communication: no family was present at bedside, at the time of interview.    Disposition: back to group home after workup completed  Consultants: gastroenterology Procedures: IV IRON  Antibiotics: Anti-infectives    None        Subjective:  Feels ok,  completed prep  Objective: Physical Exam: Vitals:   09/26/16 1515 09/26/16 1525 09/26/16 2236 09/27/16 0557  BP: (!) 159/74 (!) 163/75 (!) 168/74 (!) 150/65  Pulse: 78 72 84 78  Resp: 17 15 17 16   Temp:   98.1 F (36.7 C) 98 F (36.7 C)    TempSrc:   Oral   SpO2: 97% 96% 97% 97%    Intake/Output Summary (Last 24 hours) at 09/27/16 1252 Last data filed at 09/27/16 0920  Gross per 24 hour  Intake              480 ml  Output                0 ml  Net              480 ml   There were no vitals filed for this visit.  General: AAOx3 no distress Eyes: PERRL, Conjunctiva normal ENT: Oral Mucosa clear moist. Neck: difficult to assess JVD, no Abnormal Mass Or lumps Cardiovascular: S1 and S2 Present, no Murmur, Respiratory: Bilateral Air entry equal and Decreased, no use of accessory muscle, Clear to Auscultation, no Crackles, no wheezes Abdomen: Bowel Sound present, Soft and no tenderness Skin: no redness, no Rash, no induration Extremities: no Pedal edema, no calf tenderness Neurologic: Grossly no focal neuro deficit. Bilaterally Equal motor strength  Data Reviewed: CBC:  Recent Labs Lab 09/24/16 1540 09/24/16 1711 09/25/16 0957 09/26/16 0416 09/27/16 0509  WBC 10.2  --  8.6 10.3 11.7*  NEUTROABS 7.5  --  6.3 7.0  --   HGB 3.4* 3.4* 8.2* 7.8* 8.3*  HCT 12.7* 12.7* 25.2* 24.1* 26.9*  MCV 66.1*  --  71.8* 71.9* 74.5*  PLT 674*  --  503* 468* [id number]*   Basic Metabolic Panel:  Recent Labs Lab 09/24/16 1540 09/25/16 0957 09/26/16 0416 09/27/16 0509  NA 135 135 137 138  K 4.8 4.3 4.3 3.9  CL 101 100* 104 105  CO2 20* 27 25 24   GLUCOSE 231* 130* 124* 128*  BUN 19 16 10  <5*  CREATININE 1.59* 1.27* 1.14* 1.05*  CALCIUM 9.5 9.5 9.2 9.2    Liver Function Tests:  Recent Labs Lab 09/24/16 1540 09/26/16 0416  AST 19 16  ALT 10* 11*  ALKPHOS 34* 37*  BILITOT 0.5 0.6  PROT 6.7 6.1*  ALBUMIN 3.4* 3.1*   No results for input(s): LIPASE, AMYLASE in the last 168 hours. No results for input(s): AMMONIA in the last 168 hours. Coagulation Profile:  Recent Labs Lab 09/24/16 1725 09/26/16 0416  INR 1.16 1.10   Cardiac Enzymes: No results for input(s): CKTOTAL, CKMB, CKMBINDEX, TROPONINI in the last 168  hours. BNP (last 3 results) No results for input(s): PROBNP in the last 8760 hours.  CBG:  Recent Labs Lab 09/26/16 1228 09/26/16 1625 09/26/16 2210 09/27/16 0806 09/27/16 1140  GLUCAP 148* 84 100* 125* 132*    Studies: No results found.   Scheduled Meds: . allopurinol  100 mg Oral BID  . atorvastatin  40 mg Oral QHS  . benztropine  2 mg Oral BID  . divalproex  1,000 mg Oral QHS  . escitalopram  20 mg Oral BH-q7a  . insulin aspart  0-15 Units Subcutaneous TID WC  . insulin aspart  0-5 Units Subcutaneous QHS  . latanoprost  1 drop Both Eyes QHS  . levothyroxine  88 mcg Oral QAC breakfast  . pantoprazole (PROTONIX) IV  40 mg Intravenous Q12H  . polyethylene glycol  17 g Oral Daily  . senna-docusate  1 tablet Oral BID  . traZODone  100 mg Oral QHS   Continuous Infusions:  PRN Meds: acetaminophen **OR** acetaminophen, ondansetron **OR** ondansetron (ZOFRAN) IV  Time spent: 30 minutes  Author: Domenic Polite, MD [facility] Pager: [phone] 09/27/2016 12:52 PM  If 7PM-7AM, please contact night-coverage at [email or web address], password [facility]

## 2016-09-27 NOTE — Op Note (Signed)
[facility] Patient Name: [name] Procedure Date : 09/27/2016 MRN: [id number] Attending MD: Carol Ada , MD Date of Birth: 06-01-1956 CSN: [id number] Age: 61 Admit Type: Inpatient Procedure:                Colonoscopy Indications:              Iron deficiency anemia Providers:                Carol Ada, MD, Elna Breslow, RN, William Dalton, Technician Referring MD:              Medicines:                Propofol per Anesthesia Complications:            No immediate complications. Estimated Blood Loss:     Estimated blood loss was minimal. Procedure:                Pre-Anesthesia Assessment:                           - Prior to the procedure, a History and Physical                            was performed, and patient medications and                            allergies were reviewed. The patient's tolerance of                            previous anesthesia was also reviewed. The risks                            and benefits of the procedure and the sedation                            options and risks were discussed with the patient.                            All questions were answered, and informed consent                            was obtained. Prior Anticoagulants: The patient has                            taken no previous anticoagulant or antiplatelet                            agents. ASA Grade Assessment: III - A patient with                            severe systemic disease. After reviewing the risks  and benefits, the patient was deemed in                            satisfactory condition to undergo the procedure.                           - Sedation was administered by an anesthesia                            professional. Deep sedation was attained.                           After obtaining informed consent, the colonoscope                            was passed under direct vision.  Throughout the                            procedure, the patient's blood pressure, pulse, and                            oxygen saturations were monitored continuously. The                            EC-[id number] scope was introduced through                            the anus and advanced to the the cecum, identified                            by appendiceal orifice and ileocecal valve. The                            colonoscopy was performed with difficulty due to                            significant looping and a tortuous colon.                            Successful completion of the procedure was aided by                            applying abdominal pressure. The patient tolerated                            the procedure well. The quality of the bowel                            preparation was good. The ileocecal valve,                            appendiceal orifice, and rectum were photographed. Scope In: 1:49:19 PM Scope Out: 2:17:08 PM Scope Withdrawal Time: 0 hours 16 minutes 4 seconds  Total Procedure Duration: 0  hours 27 minutes 49 seconds  Findings:      A 3 mm polyp was found in the ascending colon. The polyp was sessile.       The polyp was removed with a cold snare. Resection and retrieval were       complete.      Scattered small and large-mouthed diverticula were found in the sigmoid       colon and descending colon. Impression:               - One 3 mm polyp in the ascending colon, removed                            with a cold snare. Resected and retrieved. Moderate Sedation:      None Recommendation:           - Return patient to hospital ward for ongoing care.                           - NPO.                           - Continue present medications.                           - Await pathology results.                           - Repeat colonoscopy in 5-10 years for surveillance.                           - To visualize the small bowel, perform video                             capsule endoscopy. Procedure Code(s):        --- Professional ---                           [phone], Colonoscopy, flexible; with removal of                            tumor(s), polyp(s), or other lesion(s) by snare                            technique Diagnosis Code(s):        --- Professional ---                           D12.2, Benign neoplasm of ascending colon                           D50.9, Iron deficiency anemia, unspecified CPT copyright 2016 [employer]. All rights reserved. The codes documented in this report are preliminary and upon coder review may  be revised to meet current compliance requirements. Carol Ada, MD Carol Ada, MD 09/27/2016 3:14:41 PM This report has been signed electronically. Number of Addenda: 0

## 2016-09-27 NOTE — Transfer of Care (Signed)
Immediate Anesthesia Transfer of Care Note  Patient: [name]  Procedure(s) Performed: Procedure(s): COLONOSCOPY (N/A) GIVENS CAPSULE STUDY (N/A)  Patient Location: PACU  Anesthesia Type:MAC  Level of Consciousness: awake and alert   Airway & Oxygen Therapy: Patient Spontanous Breathing  Post-op Assessment: Report given to RN and Post -op Vital signs reviewed and stable  Post vital signs: Reviewed and stable  Last Vitals:  Vitals:   09/27/16 0557 09/27/16 1309  BP: (!) 150/65 (!) 163/65  Pulse: 78 77  Resp: 16 14  Temp: 36.7 C 37.2 C    Last Pain:  Vitals:   09/27/16 1309  TempSrc: Oral  PainSc:          Complications: No apparent anesthesia complications

## 2016-09-27 NOTE — Anesthesia Postprocedure Evaluation (Addendum)
Anesthesia Post Note  Patient: [name]  Procedure(s) Performed: Procedure(s) (LRB): COLONOSCOPY (N/A) GIVENS CAPSULE STUDY (N/A)  Patient location during evaluation: Endoscopy Anesthesia Type: MAC Level of consciousness: awake and alert Pain management: pain level controlled Vital Signs Assessment: post-procedure vital signs reviewed and stable Respiratory status: spontaneous breathing and respiratory function stable Cardiovascular status: stable Anesthetic complications: no       Last Vitals:  Vitals:   09/27/16 1423 09/27/16 1440  BP: (!) 125/59 135/71  Pulse: 85 79  Resp: 14 17  Temp: 37.2 C     Last Pain:  Vitals:   09/27/16 1423  TempSrc: Oral  PainSc:                  [name] DANIEL

## 2016-09-28 ENCOUNTER — Encounter (HOSPITAL_COMMUNITY): Payer: Self-pay | Admitting: Gastroenterology

## 2016-09-28 LAB — GLUCOSE, CAPILLARY
Glucose-Capillary: 153 mg/dL — ABNORMAL HIGH (ref 65–99)
Glucose-Capillary: 143 mg/dL — ABNORMAL HIGH (ref 65–99)

## 2016-09-28 LAB — CBC
HCT: 28.1 % — ABNORMAL LOW (ref 36.0–46.0)
Hemoglobin: 8.6 g/dL — ABNORMAL LOW (ref 12.0–15.0)
MCH: 23.5 pg — ABNORMAL LOW (ref 26.0–34.0)
MCHC: 30.6 g/dL (ref 30.0–36.0)
MCV: 76.8 fL — ABNORMAL LOW (ref 78.0–100.0)
Platelets: 462 10*3/uL — ABNORMAL HIGH (ref 150–400)
RBC: 3.66 MIL/uL — ABNORMAL LOW (ref 3.87–5.11)
RDW: 22.4 % — ABNORMAL HIGH (ref 11.5–15.5)
WBC: 12.3 10*3/uL — ABNORMAL HIGH (ref 4.0–10.5)

## 2016-09-28 MED ORDER — FERROUS GLUCONATE 324 (38 FE) MG PO TABS: 324.0000 mg | ORAL_TABLET | Freq: Two times a day (BID) | ORAL | 1 refills | Status: DC

## 2016-09-28 NOTE — Care Management Important Message (Signed)
Important Message  Patient Details  Name: [name] MRN: [id number] Date of Birth: 10/07/1955   Medicare Important Message Given:  Yes    Orbie Pyo 09/28/2016, 1:06 PM

## 2016-09-28 NOTE — Clinical Social Work Note (Signed)
Clinical Social Work Assessment  Patient Details  Name: [name] MRN: [id number] Date of Birth: 14-Nov-1955  Date of referral:  09/28/16               Reason for consult:  Facility Placement, Discharge Planning                Permission sought to share information with:  Family Supports Permission granted to share information::  No   Housing/Transportation Living arrangements for the past 2 months:  [place] of Information:  Patient Patient Interpreter Needed:  None Criminal Activity/Legal Involvement Pertinent to Current Situation/Hospitalization:  No - Comment as needed Significant Relationships:  None Lives with:  Self Do you feel safe going back to the place where you live?  Yes Need for family participation in patient care:  No (Coment)  Care giving concerns:  No care giving concerns identified.   Social Worker assessment / plan:  CSW met with pt to address consult as pt was [facility] CSW introduced herself and explained role of social work. CSW also explained the process of discharging and returning to facility. Pt is ready for discharge today. CSW spoke with facility and pt is able to return. Facility will provide transportation. Pt is aware and agreeable to discharge plan. Pt shared that she spoke with her sister to update her regarding discharge and therefore CSW does not need to call family. CSW is signing off as no further needs identified.   Employment status:  Disabled (Comment on whether or not currently receiving Disability) Insurance information:  Medicare PT Recommendations:  Not assessed at this time Information / Referral to community resources:  [place]  Patient/Family's Response to care:  Pt was appreciative of CSW support.   Patient/Family's Understanding of and Emotional Response to Diagnosis, Current Treatment, and Prognosis:  Pt understands that she will be returning home to the facility.    Emotional Assessment Appearance:  Appears stated age Attitude/Demeanor/Rapport:   (Appropriate) Affect (typically observed):  Accepting, Adaptable, Pleasant Orientation:  Oriented to Self, Oriented to Place, Oriented to  Time, Oriented to Situation Alcohol / Substance use:  Not Applicable Psych involvement (Current and /or in the community):  No (Comment)  Discharge Needs  Concerns to be addressed:  Adjustment to Illness Readmission within the last 30 days:  No Current discharge risk:  None Barriers to Discharge:  No Barriers Identified   Darden Dates, LCSW 09/28/2016, 1:57 PM

## 2016-09-28 NOTE — Discharge Summary (Signed)
Physician Discharge Summary  [name] [id number] DOB: 06-21-1956 DOA: 09/24/2016  PCP: Leonard Downing, MD  Admit date: 09/24/2016 Discharge date: 09/28/2016  Time spent: 35 minutes  Recommendations for Outpatient Follow-up:  1. Dr.Hung in 2weeks, please consider Capsule Endoscopy  Please FU Pathology/Biopsy from EGD and Colonoscopy 2. PCP Dr.Elkins in 1week, please monitor Hb periodically   Discharge Diagnoses:  Principal Problem:   Severe Iron deficiency anemia   Hypothyroidism   Schizophrenia ([facility])   HTN (hypertension)   Diabetes mellitus ([place])   AKI (acute kidney injury) [facility])   Discharge Condition: stable  Diet recommendation:Diabetic  Filed Weights   09/27/16 1309 09/27/16 1437  Weight: 106.6 kg (235 lb) 106.6 kg (235 lb)    History of present illness:  Pt. with PMH of Schizophrenia, hypothyroidism, HTN, type II DM; admitted on 09/24/2016, with complaint of fatigue and tiredness with near syncope, was found to have symptomatic anemia with hemoglobin of 3.2.  Hospital Course:  Severe iron deficiency anemia  -Iron 6, ferritin 2. Normal 0000000 and folic acid. -CT abdomen and pelvis without any evidence of retroperitoneal bleed or uterine mass. -hemoccult was positive, GI Dr.Hung consulted  EGD notable for gastric polyps only and colonoscopy with polyp and diverticuli -s/p IV iron, started on PO iron at discharge -s/p 4units PRBC, Hb stable since then -FU with Dr.Hung and consider Capsule endoscopy as outpatient  AKI -Uncertain baseline creatinine since it has not been reported in Epic since 2/16. -Creatinine 1.59 with normal BUN (1.06 in 2/16) -resolved with IV hydration  DM -hba1c is 5.9 -resume oral meds at discharge  Hypothyroidism -TSh stable -Continue Synthroid   HTN -resumed lisinopril  Schizophrenia -Continue home meds: Cogentin, Depakote, Lexapro, Invega, Trazodone -Lives in a group home  Procedures: EGD: Normal appearing, widely  patent esophagus and GEJ, A few small sessile gastric polyps along the GC-biopsied for pathology.  Colonoscopy: One 3 mm polyp in the ascending colon, removed                            with a cold snare. Resected and retrieved. Diverticula in sigmoid and descending colon noted  Consultations:  Gi Dr.Hung  Discharge Exam: Vitals:   09/28/16 0554 09/28/16 1012  BP: (!) 141/61 (!) 108/55  Pulse: 79 89  Resp: 17 18  Temp: 98.8 F (37.1 C) 99.1 F (37.3 C)    General:AAOx3 Cardiovascular: S1s2/RRR Respiratory: CTAB  Discharge Instructions   Discharge Instructions    Diet - low sodium heart healthy    Complete by:  As directed    Increase activity slowly    Complete by:  As directed      Current Discharge Medication List    START taking these medications   Details  ferrous gluconate (FERGON) 324 MG tablet Take 1 tablet (324 mg total) by mouth 2 (two) times daily with a meal. Qty: 60 tablet, Refills: 1      CONTINUE these medications which have NOT CHANGED   Details  allopurinol (ZYLOPRIM) 100 MG tablet Take 100 mg by mouth 2 (two) times daily.    atorvastatin (LIPITOR) 40 MG tablet Take 40 mg by mouth daily at 6 PM.    benztropine (COGENTIN) 2 MG tablet Take 2 mg by mouth 2 (two) times daily.    colchicine 0.6 MG tablet Take 0.6 mg by mouth daily.    divalproex (DEPAKOTE ER) 500 MG 24 hr tablet Take 1,000 mg by mouth at  bedtime.     escitalopram (LEXAPRO) 20 MG tablet Take 20 mg by mouth every morning.     glipiZIDE (GLUCOTROL) 10 MG tablet Take 10 mg by mouth 2 (two) times daily before a meal.    latanoprost (XALATAN) 0.005 % ophthalmic solution Place 1 drop into both eyes at bedtime.    levothyroxine (SYNTHROID, LEVOTHROID) 88 MCG tablet Take 88 mcg by mouth daily before breakfast.    lisinopril (PRINIVIL,ZESTRIL) 10 MG tablet Take 10 mg by mouth daily.    metFORMIN (GLUCOPHAGE) 1000 MG tablet Take 1,000 mg by mouth 2 (two) times daily with a meal.     paliperidone (INVEGA SUSTENNA) 234 MG/1.5ML SUSP injection Inject 234 mg into the muscle every 30 (thirty) days.    polyethylene glycol (MIRALAX / GLYCOLAX) packet Take 17 g by mouth daily.    traZODone (DESYREL) 100 MG tablet Take 100 mg by mouth at bedtime.       STOP taking these medications     meloxicam (MOBIC) 15 MG tablet        No Known Allergies Follow-up Information    HUNG,PATRICK D, MD Follow up in 2 week(s).   Specialty:  Gastroenterology Why:  May need evaluation for Capsule Endoscopy at Follow up Contact information: [address] [place] [address] [phone]            The results of significant diagnostics from this hospitalization (including imaging, microbiology, ancillary and laboratory) are listed below for reference.    Significant Diagnostic Studies: Ct Abdomen Pelvis Wo Contrast  Result Date: 09/25/2016 CLINICAL DATA:  Severe anemia. EXAM: CT ABDOMEN AND PELVIS WITHOUT CONTRAST TECHNIQUE: Multidetector CT imaging of the abdomen and pelvis was performed following the standard protocol without IV contrast. COMPARISON:  None. FINDINGS: Lower chest: Large hiatal hernia is noted. Visualized lung bases are unremarkable. Hepatobiliary: No focal liver abnormality is seen. Status post cholecystectomy. No biliary dilatation. Pancreas: Unremarkable. No pancreatic ductal dilatation or surrounding inflammatory changes. Spleen: Normal in size without focal abnormality. Adrenals/Urinary Tract: Adrenal glands are unremarkable. Bilateral nephrolithiasis is noted. No hydronephrosis or renal obstruction is noted. Urinary bladder is unremarkable. Stomach/Bowel: Large amount of stool is noted throughout the colon. There is no small bowel dilatation. Vascular/Lymphatic: Aortic atherosclerosis. No enlarged abdominal or pelvic lymph nodes. Reproductive: Uterus and bilateral adnexa are unremarkable. Other: No abdominal wall hernia or abnormality. No  abdominopelvic ascites. Musculoskeletal: No acute or significant osseous findings. IMPRESSION: Large hiatal hernia. Nonobstructive bilateral nephrolithiasis. Aortic atherosclerosis. No other abnormality seen in the abdomen or pelvis. Electronically Signed   By: Marijo Conception, M.D.   On: 09/25/2016 12:12    Microbiology: Recent Results (from the past 240 hour(s))  MRSA PCR Screening     Status: None   Collection Time: 09/24/16 11:25 PM  Result Value Ref Range Status   MRSA by PCR NEGATIVE NEGATIVE Final    Comment:        The GeneXpert MRSA Assay (FDA approved for NASAL specimens only), is one component of a comprehensive MRSA colonization surveillance program. It is not intended to diagnose MRSA infection nor to guide or monitor treatment for MRSA infections.      Labs: Basic Metabolic Panel:  Recent Labs Lab 09/24/16 1540 09/25/16 0957 09/26/16 0416 09/27/16 0509  NA 135 135 137 138  K 4.8 4.3 4.3 3.9  CL 101 100* 104 105  CO2 20* 27 25 24   GLUCOSE 231* 130* 124* 128*  BUN 19 16 10  <5*  CREATININE  1.59* 1.27* 1.14* 1.05*  CALCIUM 9.5 9.5 9.2 9.2   Liver Function Tests:  Recent Labs Lab 09/24/16 1540 09/26/16 0416  AST 19 16  ALT 10* 11*  ALKPHOS 34* 37*  BILITOT 0.5 0.6  PROT 6.7 6.1*  ALBUMIN 3.4* 3.1*   No results for input(s): LIPASE, AMYLASE in the last 168 hours. No results for input(s): AMMONIA in the last 168 hours. CBC:  Recent Labs Lab 09/24/16 1540 09/24/16 1711 09/25/16 0957 09/26/16 0416 09/27/16 0509 09/28/16 0856  WBC 10.2  --  8.6 10.3 11.7* 12.3*  NEUTROABS 7.5  --  6.3 7.0  --   --   HGB 3.4* 3.4* 8.2* 7.8* 8.3* 8.6*  HCT 12.7* 12.7* 25.2* 24.1* 26.9* 28.1*  MCV 66.1*  --  71.8* 71.9* 74.5* 76.8*  PLT 674*  --  503* 468* 452* 462*   Cardiac Enzymes: No results for input(s): CKTOTAL, CKMB, CKMBINDEX, TROPONINI in the last 168 hours. BNP: BNP (last 3 results) No results for input(s): BNP in the last 8760 hours.  ProBNP  (last 3 results) No results for input(s): PROBNP in the last 8760 hours.  CBG:  Recent Labs Lab 09/27/16 0806 09/27/16 1140 09/27/16 1724 09/27/16 2159 09/28/16 0748  GLUCAP 125* 132* 117* 201* 153*       SignedDomenic Polite MD.  [facility] 09/28/2016, 11:08 AM

## 2016-09-28 NOTE — NC FL2 (Signed)
[place] LEVEL OF CARE SCREENING TOOL     IDENTIFICATION  Patient Name: [name] Birthdate: 13-Mar-1956 Sex: female Admission Date (Current Location): 09/24/2016  [facility] and [place] Number:  Herbalist and Address:  [facility], [place] [address], [place], [place] [address]      Provider Number: [id number]  Attending Physician Name and Address:  Domenic Polite, MD  Relative Name and Phone Number:       Current Level of Care: Hospital Recommended Level of Care: [place] Prior Approval Number:    Date Approved/Denied:   PASRR Number:    Discharge Plan:      Current Diagnoses: Patient Active Problem List   Diagnosis Date Noted  . Symptomatic anemia 09/24/2016  . AKI (acute kidney injury) ([place]) 09/24/2016  . Schizophrenia, paranoid type ([place]) 05/05/2012  . Anemia 04/30/2012  . Hyponatremia 04/29/2012  . Chloride, decreased level 04/29/2012  . Passive suicidal ideations 04/29/2012  . Suicidal ideations 04/29/2012  . HTN (hypertension) 04/29/2012  . Diabetes mellitus ([place]) 04/29/2012  . GERD (gastroesophageal reflux disease)   . Hyperlipidemia   . Chronic back pain   . Hypothyroidism   . Schizophrenia ([place])     Orientation RESPIRATION BLADDER Height & Weight     Self, Time, Situation, Place  Normal Continent Weight: 235 lb (106.6 kg) Height:  5\' 5"  (165.1 cm)  BEHAVIORAL SYMPTOMS/MOOD NEUROLOGICAL BOWEL NUTRITION STATUS      Continent Diet (Heart Healthy)  AMBULATORY STATUS COMMUNICATION OF NEEDS Skin   Supervision Verbally Normal                       Personal Care Assistance Level of Assistance  Bathing, Dressing, Feeding Bathing Assistance: Limited assistance Feeding assistance: Independent Dressing Assistance: Limited assistance     Functional Limitations Info  Sight, Hearing, Speech Sight Info: Adequate Hearing Info: Adequate Speech Info: Adequate    SPECIAL CARE FACTORS  FREQUENCY                       Contractures Contractures Info: Not present    Additional Factors Info  Code Status, Allergies, Psychotropic Code Status Info: Full Code Allergies Info: No known allergies Psychotropic Info: Lexapro, Depakote, Cogentin         Discharge Medications: Please see discharge summary for a list of discharge medications.  Current Discharge Medication List        START taking these medications   Details  ferrous gluconate (FERGON) 324 MG tablet Take 1 tablet (324 mg total) by mouth 2 (two) times daily with a meal. Qty: 60 tablet, Refills: 1          CONTINUE these medications which have NOT CHANGED   Details  allopurinol (ZYLOPRIM) 100 MG tablet Take 100 mg by mouth 2 (two) times daily.    atorvastatin (LIPITOR) 40 MG tablet Take 40 mg by mouth daily at 6 PM.    benztropine (COGENTIN) 2 MG tablet Take 2 mg by mouth 2 (two) times daily.    colchicine 0.6 MG tablet Take 0.6 mg by mouth daily.    divalproex (DEPAKOTE ER) 500 MG 24 hr tablet Take 1,000 mg by mouth at bedtime.     escitalopram (LEXAPRO) 20 MG tablet Take 20 mg by mouth every morning.     glipiZIDE (GLUCOTROL) 10 MG tablet Take 10 mg by mouth 2 (two) times daily before a meal.  latanoprost (XALATAN) 0.005 % ophthalmic solution Place 1 drop into both eyes at bedtime.    levothyroxine (SYNTHROID, LEVOTHROID) 88 MCG tablet Take 88 mcg by mouth daily before breakfast.    lisinopril (PRINIVIL,ZESTRIL) 10 MG tablet Take 10 mg by mouth daily.    metFORMIN (GLUCOPHAGE) 1000 MG tablet Take 1,000 mg by mouth 2 (two) times daily with a meal.    paliperidone (INVEGA SUSTENNA) 234 MG/1.5ML SUSP injection Inject 234 mg into the muscle every 30 (thirty) days.    polyethylene glycol (MIRALAX / GLYCOLAX) packet Take 17 g by mouth daily.    traZODone (DESYREL) 100 MG tablet Take 100 mg by mouth at bedtime.          STOP taking these medications     meloxicam  (MOBIC) 15 MG tablet        Relevant Imaging Results:  Relevant Lab Results:   Additional Information    Darden Dates, LCSW

## 2016-11-28 ENCOUNTER — Ambulatory Visit: Payer: Medicare Other | Admitting: Podiatry

## 2017-01-02 ENCOUNTER — Ambulatory Visit (INDEPENDENT_AMBULATORY_CARE_PROVIDER_SITE_OTHER): Payer: Medicare Other | Admitting: Podiatry

## 2017-01-02 DIAGNOSIS — M79674 Pain in right toe(s): Secondary | ICD-10-CM

## 2017-01-02 DIAGNOSIS — M79675 Pain in left toe(s): Secondary | ICD-10-CM

## 2017-01-02 DIAGNOSIS — B351 Tinea unguium: Secondary | ICD-10-CM

## 2017-01-02 DIAGNOSIS — E119 Type 2 diabetes mellitus without complications: Secondary | ICD-10-CM

## 2017-01-02 NOTE — Progress Notes (Signed)
Patient ID: ADDILYN SATTERWHITE, female   DOB: November 09, 1955, 61 y.o.   MRN: [id number]    Subjective: This patient presents today for a scheduled visit of toenails that are uncomfortable walking wearing shoes and she is requesting toenail debridement. Patient has had a history of medial ankle pain associated with hyperpronation that has resolved with wearing athletic style shoes.  Patient has history of diabetes Patient has history of schizophrenia  Objective:  Patient appears responsive and orientated 3  Vascular: No calf edema or calf tenderness bilaterally DP and PT pulses 2/4 bilaterally Capillary reflex immediate bilaterally  Neurological: Sensation to 10 g monofilament wire intact 5/5 bilaterally Vibratory sensation reactive bilaterally Ankle reflex equal and reactive bilaterally  Dermatological: No open skin lesions bilaterally The toenails are hypertrophic, elongated, deformed 6-10  Musculoskeletal: Weightbearing hyperpronation left Forefoot abducted on the rear foot left Patient not able to heel off left Weekly able to heel off right There is no Palpable tenderness medial ankle proximal to the malleolar area over tendons  Assessment: Diabetic with satisfactory neurovascular status Hyperpronation left Symptomatic onychomycoses 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

## 2017-01-02 NOTE — Patient Instructions (Signed)

## 2017-01-18 ENCOUNTER — Other Ambulatory Visit: Payer: Self-pay | Admitting: Family Medicine

## 2017-01-18 DIAGNOSIS — Z1231 Encounter for screening mammogram for malignant neoplasm of breast: Secondary | ICD-10-CM

## 2017-02-08 ENCOUNTER — Ambulatory Visit: Payer: Self-pay

## 2017-02-16 NOTE — Addendum Note (Signed)
Addendum  created 02/16/17 0806 by Duane Boston, MD   Sign clinical note

## 2017-02-19 ENCOUNTER — Ambulatory Visit: Payer: Self-pay

## 2017-03-06 ENCOUNTER — Ambulatory Visit
Admission: RE | Admit: 2017-03-06 | Discharge: 2017-03-06 | Disposition: A | Discharge status: Home / Self Care | Payer: Medicare Other | Source: Ambulatory Visit | Attending: Family Medicine | Admitting: Family Medicine

## 2017-03-06 DIAGNOSIS — Z1231 Encounter for screening mammogram for malignant neoplasm of breast: Secondary | ICD-10-CM

## 2017-04-03 ENCOUNTER — Ambulatory Visit (INDEPENDENT_AMBULATORY_CARE_PROVIDER_SITE_OTHER): Payer: Medicare Other | Admitting: Podiatry

## 2017-04-03 ENCOUNTER — Encounter: Payer: Self-pay | Admitting: Podiatry

## 2017-04-03 DIAGNOSIS — M79675 Pain in left toe(s): Secondary | ICD-10-CM

## 2017-04-03 DIAGNOSIS — M79674 Pain in right toe(s): Secondary | ICD-10-CM

## 2017-04-03 DIAGNOSIS — B351 Tinea unguium: Secondary | ICD-10-CM

## 2017-04-03 DIAGNOSIS — E119 Type 2 diabetes mellitus without complications: Secondary | ICD-10-CM

## 2017-04-03 NOTE — Patient Instructions (Signed)

## 2017-04-03 NOTE — Progress Notes (Signed)
Patient ID: [name], female   DOB: 01/11/1956, 61 y.o.   MRN: [id number]    Subjective: This patient presents today for a scheduled visit of toenails that are uncomfortable walking wearing shoes and she is requesting toenail debridement. Patient has had a history of medial ankle pain associated with hyperpronation that has resolved with wearing athletic style shoes.  Patient has history of diabetes Patient has history of schizophrenia  Objective:  Patient appears responsive and orientated 3  Vascular: No calf edema or calf tenderness bilaterally DP and PT pulses 2/4 bilaterally Capillary reflex immediate bilaterally  Neurological: Sensation to 10 g monofilament wire intact 5/5 bilaterally Vibratory sensation reactive bilaterally Ankle reflex equal and reactive bilaterally  Dermatological: No open skin lesions bilaterally The toenails are hypertrophic, elongated, deformed 6-10  Musculoskeletal: Weightbearing hyperpronation left Forefoot abducted on the rear foot left Patient not able to heel off left Weakly able to heel off right There is no Palpable tenderness medial ankle proximal to the malleolar area over tendons  Assessment: Diabetic with satisfactory neurovascular status Hyperpronation left with associated flatfoot Symptomatic onychomycoses 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 

## 2017-07-08 ENCOUNTER — Ambulatory Visit (INDEPENDENT_AMBULATORY_CARE_PROVIDER_SITE_OTHER): Payer: Medicare Other | Admitting: Podiatry

## 2017-07-08 ENCOUNTER — Encounter: Payer: Self-pay | Admitting: Podiatry

## 2017-07-08 DIAGNOSIS — E119 Type 2 diabetes mellitus without complications: Secondary | ICD-10-CM

## 2017-07-08 DIAGNOSIS — B351 Tinea unguium: Secondary | ICD-10-CM

## 2017-07-08 DIAGNOSIS — M79675 Pain in left toe(s): Secondary | ICD-10-CM | POA: Diagnosis not present

## 2017-07-08 DIAGNOSIS — M79674 Pain in right toe(s): Secondary | ICD-10-CM | POA: Diagnosis not present

## 2017-07-08 NOTE — Patient Instructions (Signed)

## 2017-07-08 NOTE — Progress Notes (Signed)
Patient ID: [name], female   DOB: July 30, 1956, 61 y.o.   MRN: [id number]    Subjective: This patient presents today for a scheduled visit of toenails that are uncomfortable walking wearing shoes and she is requesting toenail debridement. Patient has had a history of medial ankle pain associated with hyperpronation that has resolved with wearing athletic style shoes.  Patient has history of diabetes Patient has history of schizophrenia  Objective:  Patient appears responsive and orientated 3  Vascular: No calf edema or calf tenderness bilaterally DP and PT pulses 2/4 bilaterally Capillary reflex immediate bilaterally  Neurological: Sensation to 10 g monofilament wire intact 5/5 bilaterally Vibratory sensation reactive bilaterally Ankle reflex equal and reactive bilaterally  Dermatological: No open skin lesions bilaterally The toenails are hypertrophic, elongated, deformed 6-10  Musculoskeletal: Weightbearing hyperpronation left Forefoot abducted on the rear foot left Patient not able to heel off left Weakly able to heel off right There is no Palpable tenderness medial ankle proximal to the malleolar area over tendons  Assessment: Diabetic with satisfactory neurovascular status Hyperpronation left with associated flatfoot Symptomatic onychomycoses 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

## 2017-10-08 ENCOUNTER — Ambulatory Visit: Payer: Medicare Other | Admitting: Podiatry

## 2017-10-09 ENCOUNTER — Ambulatory Visit (INDEPENDENT_AMBULATORY_CARE_PROVIDER_SITE_OTHER): Payer: Medicare Other | Admitting: Podiatry

## 2017-10-09 ENCOUNTER — Encounter: Payer: Self-pay | Admitting: Podiatry

## 2017-10-09 DIAGNOSIS — M79675 Pain in left toe(s): Secondary | ICD-10-CM | POA: Diagnosis not present

## 2017-10-09 DIAGNOSIS — M79674 Pain in right toe(s): Secondary | ICD-10-CM | POA: Diagnosis not present

## 2017-10-09 DIAGNOSIS — B351 Tinea unguium: Secondary | ICD-10-CM

## 2017-10-10 NOTE — Progress Notes (Signed)
Subjective:   Patient ID: [name], female   DOB: 62 y.o.   MRN: [id number]   HPI Patient presents with elongated nailbeds 1-5 both feet that are thick and painful   ROS      Objective:  Physical Exam  Patient has thickened yellow brittle nailbeds 1-5 both feet that were painful when I palpated     Assessment:  Mycotic nail infection with pain 1-5 both feet     Plan:  Debride painful nailbeds 1-5 both feet no iatrogenic bleeding noted

## 2017-10-16 ENCOUNTER — Emergency Department (HOSPITAL_COMMUNITY): Payer: Medicare Other

## 2017-10-16 ENCOUNTER — Other Ambulatory Visit: Payer: Self-pay

## 2017-10-16 ENCOUNTER — Encounter (HOSPITAL_COMMUNITY): Payer: Self-pay

## 2017-10-16 ENCOUNTER — Emergency Department (HOSPITAL_COMMUNITY)
Admission: EM | Admit: 2017-10-16 | Discharge: 2017-10-17 | Disposition: A | Discharge status: Home / Self Care | Payer: Medicare Other | Attending: Emergency Medicine | Admitting: Emergency Medicine

## 2017-10-16 DIAGNOSIS — Z79899 Other long term (current) drug therapy: Secondary | ICD-10-CM | POA: Diagnosis not present

## 2017-10-16 DIAGNOSIS — Z87891 Personal history of nicotine dependence: Secondary | ICD-10-CM | POA: Diagnosis not present

## 2017-10-16 DIAGNOSIS — E669 Obesity, unspecified: Secondary | ICD-10-CM

## 2017-10-16 DIAGNOSIS — E119 Type 2 diabetes mellitus without complications: Secondary | ICD-10-CM | POA: Diagnosis not present

## 2017-10-16 DIAGNOSIS — E785 Hyperlipidemia, unspecified: Secondary | ICD-10-CM | POA: Insufficient documentation

## 2017-10-16 DIAGNOSIS — Z7984 Long term (current) use of oral hypoglycemic drugs: Secondary | ICD-10-CM | POA: Insufficient documentation

## 2017-10-16 DIAGNOSIS — I10 Essential (primary) hypertension: Secondary | ICD-10-CM | POA: Insufficient documentation

## 2017-10-16 DIAGNOSIS — R072 Precordial pain: Secondary | ICD-10-CM

## 2017-10-16 DIAGNOSIS — R079 Chest pain, unspecified: Secondary | ICD-10-CM | POA: Diagnosis present

## 2017-10-16 DIAGNOSIS — E039 Hypothyroidism, unspecified: Secondary | ICD-10-CM | POA: Diagnosis not present

## 2017-10-16 LAB — CBC
HCT: 35 % — ABNORMAL LOW (ref 36.0–46.0)
Hemoglobin: 11.6 g/dL — ABNORMAL LOW (ref 12.0–15.0)
MCH: 31.2 pg (ref 26.0–34.0)
MCHC: 33.1 g/dL (ref 30.0–36.0)
MCV: 94.1 fL (ref 78.0–100.0)
Platelets: 340 10*3/uL (ref 150–400)
RBC: 3.72 MIL/uL — ABNORMAL LOW (ref 3.87–5.11)
RDW: 14.5 % (ref 11.5–15.5)
WBC: 7 10*3/uL (ref 4.0–10.5)

## 2017-10-16 LAB — BASIC METABOLIC PANEL
Anion gap: 12 (ref 5–15)
BUN: 8 mg/dL (ref 6–20)
CO2: 26 mmol/L (ref 22–32)
Calcium: 9.5 mg/dL (ref 8.9–10.3)
Chloride: 96 mmol/L — ABNORMAL LOW (ref 101–111)
Creatinine, Ser: 1.12 mg/dL — ABNORMAL HIGH (ref 0.44–1.00)
GFR calc Af Amer: >60 mL/min (ref >60)
GFR calc non Af Amer: 53 mL/min — ABNORMAL LOW (ref >60)
Glucose, Bld: 159 mg/dL — ABNORMAL HIGH (ref 65–99)
Potassium: 4.2 mmol/L (ref 3.5–5.1)
Sodium: 134 mmol/L — ABNORMAL LOW (ref 135–145)

## 2017-10-16 LAB — I-STAT TROPONIN, ED: Troponin i, poc: 0 ng/mL (ref 0.00–0.08)

## 2017-10-16 LAB — I-STAT BETA HCG BLOOD, ED (MC, WL, AP ONLY): I-stat hCG, quantitative: <5 m[IU]/mL (ref <5)

## 2017-10-16 NOTE — ED Triage Notes (Signed)
Pt comes via [facility] for CP that started about an hour and a half ago, central with radiation to neck and back, pt took an antacid and is now pain free.

## 2017-10-17 NOTE — Discharge Instructions (Signed)

## 2017-10-17 NOTE — ED Provider Notes (Signed)
[name]ERG[facility] DEPARTMENT Provider Note   CSN: [id number] Arrival date & time: 10/16/17  1945     History   Chief Complaint Chief Complaint  Patient presents with  . Chest Pain    HPI [name] is a 62 y.o. female.  The history is provided by the patient and a relative.  Chest Pain   This is a new problem. Episode onset: 7-8 hours prior to my evaluation. The problem occurs constantly. The problem has been resolved. The pain is associated with rest. The pain is mild. The quality of the pain is described as sharp. Radiates to: Radiated to right chest neck and back. Duration of episode(s) is 30 minutes. Pertinent negatives include no diaphoresis, no fever, no lower extremity edema, no nausea, no shortness of breath, no syncope and no vomiting. She has tried nothing for the symptoms.  Her past medical history is significant for diabetes and hypertension.  Pertinent negatives for past medical history include no CAD.   Patient with history of bipolar, history of diabetes, history of hypertension presents with chest pain.  She reports that approximately 4-4:30 PM on January 30 she had onset of sharp chest pain went from left chest into her right chest into her neck and back.  She had no other associated symptoms.  She took no medications and it resolved spontaneously.  She feels well at this time She reports this she gets this pain a couple times per month.  The pain is never exertional.  She has no pain with walking or going up stairs.  She denies any history of CAD. Past Medical History:  Diagnosis Date  . Anemia 09/25/2016  . Bipolar affective disorder ([place]) 04/29/2012  . Borderline personality disorder ([place])   . Chronic back pain   . Depression   . Diabetes mellitus ([place]) 04/29/2012  . GERD (gastroesophageal reflux disease)   . HTN (hypertension) 04/29/2012  . Hyperlipidemia   . Hypothyroidism   . Schizoaffective disorder ([place]) 04/29/2012  .  Schizophrenia [facility])     Patient Active Problem List   Diagnosis Date Noted  . Symptomatic anemia 09/24/2016  . AKI (acute kidney injury) ([place]) 09/24/2016  . Schizophrenia, paranoid type ([place]) 05/05/2012  . Anemia 04/30/2012  . Hyponatremia 04/29/2012  . Chloride, decreased level 04/29/2012  . Passive suicidal ideations 04/29/2012  . Suicidal ideations 04/29/2012  . HTN (hypertension) 04/29/2012  . Diabetes mellitus ([place]) 04/29/2012  . GERD (gastroesophageal reflux disease)   . Hyperlipidemia   . Chronic back pain   . Hypothyroidism   . Schizophrenia [facility])     Past Surgical History:  Procedure Laterality Date  . APPENDECTOMY    . CHOLECYSTECTOMY    . COLONOSCOPY N/A 09/27/2016   Procedure: COLONOSCOPY;  Surgeon: Carol Ada, MD;  Location: [facility] ENDOSCOPY;  Service: Endoscopy;  Laterality: N/A;  . ESOPHAGOGASTRODUODENOSCOPY N/A 09/26/2016   Procedure: ESOPHAGOGASTRODUODENOSCOPY (EGD);  Surgeon: Juanita Craver, MD;  Location: [facility] ENDOSCOPY;  Service: Endoscopy;  Laterality: N/A;  . GIVENS CAPSULE STUDY N/A 09/27/2016   Procedure: GIVENS CAPSULE STUDY;  Surgeon: Carol Ada, MD;  Location: [place];  Service: Endoscopy;  Laterality: N/A;  . KNEE SURGERY     right    OB History    No data available       Home Medications    Prior to Admission medications   Medication Sig Start Date End Date Taking? Authorizing Provider  allopurinol (ZYLOPRIM) 100 MG tablet Take 100 mg by mouth 2 (two)  times daily.    [provider]  atorvastatin (LIPITOR) 40 MG tablet Take 40 mg by mouth daily at 6 PM.    [provider]  benztropine (COGENTIN) 2 MG tablet Take 2 mg by mouth 2 (two) times daily.    [provider]  colchicine 0.6 MG tablet Take 0.6 mg by mouth daily.    [provider]  divalproex (DEPAKOTE ER) 500 MG 24 hr tablet Take 1,000 mg by mouth at bedtime.  08/28/13   [provider]  escitalopram (LEXAPRO) 20 MG tablet Take 20 mg by  mouth every morning.     [provider]  ferrous gluconate (FERGON) 324 MG tablet Take 1 tablet (324 mg total) by mouth 2 (two) times daily with a meal. 09/28/16   Domenic Polite, MD  glipiZIDE (GLUCOTROL) 10 MG tablet Take 10 mg by mouth 2 (two) times daily before a meal.    [provider]  latanoprost (XALATAN) 0.005 % ophthalmic solution Place 1 drop into both eyes at bedtime.    [provider]  levothyroxine (SYNTHROID, LEVOTHROID) 88 MCG tablet Take 88 mcg by mouth daily before breakfast.    [provider]  lisinopril (PRINIVIL,ZESTRIL) 10 MG tablet Take 10 mg by mouth daily.    [provider]  metFORMIN (GLUCOPHAGE) 1000 MG tablet Take 1,000 mg by mouth 2 (two) times daily with a meal.    [provider]  paliperidone (INVEGA SUSTENNA) 234 MG/1.5ML SUSP injection Inject 234 mg into the muscle every 30 (thirty) days.    [provider]  polyethylene glycol (MIRALAX / GLYCOLAX) packet Take 17 g by mouth daily.    [provider]  traZODone (DESYREL) 100 MG tablet Take 100 mg by mouth at bedtime.     [provider]    Family History Family History  Problem Relation Age of Onset  . CVA Mother 61  . Dementia Father 36  . CAD Maternal Grandmother   . Colon cancer Maternal Grandfather     Social History Social History   Tobacco Use  . Smoking status: Former Smoker    Last attempt to quit: 2008    Years since quitting: 11.0  . Smokeless tobacco: Never Used  Substance Use Topics  . Alcohol use: No    Comment: h/o use but none in years  . Drug use: No    Comment: remote h/o drug use - cocaine, marijuana, uppers, downers, qualudes     Allergies   Patient has no known allergies.   Review of Systems Review of Systems  Constitutional: Negative for diaphoresis and fever.  Respiratory: Negative for shortness of breath.   Cardiovascular: Positive for chest pain. Negative for syncope.    Gastrointestinal: Negative for nausea and vomiting.  Neurological: Negative for syncope.  All other systems reviewed and are negative.    Physical Exam Updated Vital Signs BP (!) 148/72   Pulse 78   Temp 98 F (36.7 C) (Oral)   Resp 19   Ht 1.626 m (5\' 4" )   Wt 106.6 kg (235 lb)   SpO2 97%   BMI 40.34 kg/m   Physical Exam CONSTITUTIONAL: Well developed/well nourished HEAD: Normocephalic/atraumatic EYES: EOMI/PERRL ENMT: Mucous membranes moist NECK: supple no meningeal signs SPINE/BACK:entire spine nontender CV: S1/S2 noted, no murmurs/rubs/gallops noted LUNGS: Lungs are clear to auscultation bilaterally, no apparent distress ABDOMEN: soft, nontender, no rebound or guarding, bowel sounds noted throughout abdomen GU:no cva tenderness NEURO: Pt is awake/alert/appropriate, moves all extremitiesx4.  No facial droop.   EXTREMITIES: pulses normal/equal, full ROM, no lower extremity edema or calf tenderness SKIN: warm, color normal PSYCH: Flat affect  ED Treatments / Results  Labs (all labs ordered are listed, but only abnormal results are displayed) Labs Reviewed  BASIC METABOLIC PANEL - Abnormal; Notable for the following components:      Result Value   Sodium 134 (*)    Chloride 96 (*)    Glucose, Bld 159 (*)    Creatinine, Ser 1.12 (*)    GFR calc non Af Amer 53 (*)    All other components within normal limits  CBC - Abnormal; Notable for the following components:   RBC 3.72 (*)    Hemoglobin 11.6 (*)    HCT 35.0 (*)    All other components within normal limits  I-STAT TROPONIN, ED  I-STAT BETA HCG BLOOD, ED ([facility], [facility], [facility] ONLY)    EKG  EKG Interpretation  Date/Time:  Wednesday October 16 2017 19:46:43 EST Ventricular Rate:  90 PR Interval:  178 QRS Duration: 80 QT Interval:  352 QTC Calculation: 430 R Axis:   33 Text Interpretation:  Sinus rhythm with frequent Premature ventricular complexes Otherwise normal ECG Confirmed by Ripley Fraise [phone]) on  10/16/2017 11:44:28 PM       Radiology Dg Chest 2 View  Result Date: 10/16/2017 CLINICAL DATA:  Left-sided chest pain for 1 day EXAM: CHEST  2 VIEW COMPARISON:  10/18/2014 FINDINGS: Cardiac shadow is stable. The lungs are well aerated bilaterally. No focal infiltrate or sizable effusion is seen. No acute bony abnormality is noted. IMPRESSION: No active cardiopulmonary disease. Electronically Signed   By: Inez Catalina M.D.   On: 10/16/2017 20:33    Procedures Procedures (including critical care time)  Medications Ordered in ED Medications - No data to display   Initial Impression / Assessment and Plan / ED Course  I have reviewed the triage vital signs and the nursing notes.  Pertinent labs & imaging results that were available during my care of the patient were reviewed by me and considered in my medical decision making (see chart for details).     Heart score equals 2 My suspicion for acute dissection/PE/ACS is low. Patient is well-appearing, resting comfortably and in no distress I did offer monitoring and repeat troponin.  Patient prefers to go home. I did advise follow-up with cardiology. EKG reveals PVC, but no other acute ST changes Patient and family feels comfortable with this plan Given the lack of associated symptoms, as well as sharp chest pain my suspicion for ACS is low.  She reports actually feels somewhat like reflux she had previously We discussed strict ER return precautions and patient is agreeable plan  Final Clinical Impressions(s) / ED Diagnoses   Final diagnoses:  Precordial pain    ED Discharge Orders    None       Ripley Fraise, MD 10/17/17 [phone]

## 2017-11-06 ENCOUNTER — Encounter: Payer: Self-pay | Admitting: Cardiology

## 2017-11-06 ENCOUNTER — Encounter (HOSPITAL_COMMUNITY): Payer: Self-pay | Admitting: Cardiology

## 2017-11-06 DIAGNOSIS — E669 Obesity, unspecified: Secondary | ICD-10-CM

## 2017-11-06 NOTE — Progress Notes (Signed)
[name]  Date of visit:  11/06/2017 DOB:  03/27/1956    Age:  62 yrs. Medical record number:  [id number]     Account number:  [id number] Primary Care Provider: Claris Gower ____________________________ CURRENT DIAGNOSES  1. Dyspnea  2. Chest pain  3. Essential hypertension  4. Hyperlipidemia  5. Hypothyroidism  6. Type 2 diabetes mellitus without complications  7. Obesity  8. Schizoaffective Disorder, Bipolar Type  9. Atherosclerosis of aorta ____________________________ ALLERGIES  No Known Drug Allergies ____________________________ MEDICATIONS  1. allopurinol 100 mg tablet, 1 p.o. daily  2. atorvastatin 40 mg tablet, 1 p.o. daily  3. benztropine 2 mg tablet, BID  4. colchicine 0.6 mg tablet, 1 p.o. daily PRN  5. divalproex ER 500 mg tablet,extended release 24 hr, 2 tablets QHS  6. escitalopram 20 mg tablet, 1 p.o. daily  7. ferrous sulfate 325 mg (65 mg iron) tablet, TID  8. glipizide 10 mg tablet, BID  9. hydroxyzine HCl 25 mg tablet, TID PRN  10. Invega Sustenna 234 mg/1.5 mL intramuscular syringe, Q 4 wks  11. latanoprost 0.005 % eye drops, Take as directed  12. levothyroxine 100 mcg tablet, 1 p.o. daily  13. metformin 1,000 mg tablet, BID  14. paliperidone ER 3 mg tablet,extended release 24 hr, QHS  15. ranitidine 300 mg tablet, 1 p.o. daily  16. trazodone 100 mg tablet, QHS ____________________________ HISTORY OF PRESENT ILLNESS This 62 year old female is seen for evaluation of recent chest pain. She has a history of schizoaffective disorder and currently lives in a group home. She has had several behavioral health admissions over the years. She has a history of diabetes mellitus, hypertension, and hyperlipidemia and has previous aortic atherosclerosis noted on a CT scan. She quit smoking a number of years ago. She presented to emergency room with sharp chest pain was described as pressure-like in the setting of marked fluctuations in blood pressure. EKG showed  PVCs but was otherwise normal and a chest x-ray was normal. She was sent home and she has not had chest pain since she was seen in the emergency room. She denies pressure-like chest pain consistent with angina. She denies PND, orthopnea or significant edema. She complains of some pain involving the left leg at times. She has severe chronic back pain and exercise to pain and walk with a cane. She was evaluated a year ago for severe iron deficiency anemia. She wound up having endoscopy as well as a colonoscopy with findings of polyps. ____________________________ PAST HISTORY  Past Medical Illnesses:  hypertension, DM-non-insulin dependent, hyperlipidemia, hypothyroidism, obesity, bipolar disorder, schizophrenia, history of iron deficiency anemia, gout;  Cardiovascular Illnesses:  atherosclerosis of aorta;  Infectious Diseases:  no previous history of significant infectious diseases;  Surgical Procedures:  appendectomy, arthroscopic rt knee surgery, cholecystectomy (lap);  Trauma History:  no previous history of significant trauma;  NYHA Classification:  I;  Cardiology Procedures-Invasive:  no previous interventional or invasive cardiology procedures;  Cardiology Procedures-Noninvasive:  no previous non-invasive cardiovascular testing;  Peripheral Vascular Procedures:  no previous invasive peripheral vascular procedures.;  LVEF not documented,   ____________________________ CARDIO-PULMONARY TEST DATES EKG Date:  11/06/2017;  Chest Xray Date: 10/16/2017;   ____________________________ FAMILY HISTORY Brother -- Alive and well Father -- Father dead, Stroke Mother -- Mother dead, Dementia/Alzheimers Sister -- Comptroller and well Sister -- Sister alive with problem, Dementia/Alzheimers Sister -- Sister alive and well ____________________________ SOCIAL HISTORY Alcohol Use:  does not use alcohol;  Smoking:  used to smoke  but quit 2008;  Diet:  regular diet;  Lifestyle:  single;  Exercise:  exercise is limited  due to physical disability;  Occupation:  retired;  Residence:  lives in a group home;   ____________________________ REVIEW OF SYSTEMS General:  weight loss of approximately 5 lbs  Integumentary:no rashes or new skin lesions. Eyes: wears eye glasses/contact lenses, glaucoma Ears, Nose, Throat, Mouth:  denies any hearing loss, epistaxis, hoarseness or difficulty speaking. Respiratory: denies dyspnea, cough, wheezing or hemoptysis. Cardiovascular:  please review HPI Abdominal: dyspepsia and history of GERDGenitourinary-Female: no dysuria, urgency, frequency, UTIs, or stress incontinence Musculoskeletal:  chronic low back pain Neurological:  denies headaches, stroke, or TIA Psychiatric:  depression Hematological/Immunologic:  denies any food allergies, bleeding disorders. ____________________________ PHYSICAL EXAMINATION VITAL SIGNS  Blood Pressure:  114/70 Sitting, Left arm, large cuff  , 124/76 Standing, Left arm and large cuff   Pulse:  86/min. Weight:  237.00 lbs. Height:  65"BMI: 39  Constitutional:  pleasant white female, in no acute distress, severely obese Skin:  tattoos on both inner arms Head:  normocephalic, normal hair pattern, no masses or tenderness Eyes:  EOMS Intact, PERRLA, C and S clear, Funduscopic exam not done. ENT:  ears, nose and throat reveal no gross abnormalities.  Dentition good. Neck:  supple, without massess. No JVD, thyromegaly or carotid bruits. Carotid upstroke normal. Chest:  normal symmetry, clear to auscultation. Cardiac:  regular rhythm, normal S1 and S2, No S3 or S4, no murmurs, gallops or rubs detected. Abdomen:  abdomen soft,non-tender, no masses, no hepatospenomegaly, or aneurysm noted Peripheral Pulses:  the femoral,dorsalis pedis, and posterior tibial pulses are full and equal bilaterally with no bruits auscultated. Extremities & Back:  no deformities, clubbing, cyanosis, erythema or edema observed. Normal muscle strength and tone. Neurological:  no gross  motor or sensory deficits noted, affect appropriate, oriented x3. ___________________________ IMPRESSIONS/PLAN  1. Cheat pain is atypical for myocardial ischemia although she has multiple risk factors for CAD and has atherosclerosis of her aorta on a prior CT scan.  2. Diabetes mellitus 3. Hypertension 4. HYperlipidemia 5. Severe obesity with inablity to exercise 6. Aortic atherosclerosis 7. Schizoaffective disorder 8. Assymptomatic PVC's  Recommendations:  12 lead EKG personally reviewed is normal except for PVC's.  SHe is not a good candidate for stress testing due to her gait and myocardial perfusion scan will be diffucult due to he large body habitus and PVC's that may affect gating.  Since she is not having chest pain currently I would continue exentsive risk factor modification with weight loss, BP, and diabetic control as well as control of lipids. I would like for her to have an ECHO to evaluate LV thickness and function. IF she has recurrent chest pain in the future, the best way to evaluate this may be a coronary CTA.   Greater than 60 minutes spent including greater than 50% of time spent face to face including counseling, carecoordination concerning chest pain, cardiac eval and multiple risk factors as well as conditions noted above. Extensive review of old records done.  ____________________________ TODAYS ORDERS  1. 2D, color flow, doppler: First Available  2. 12 Lead EKG: Today  3. Return with ECHO:                        ____________________________ Cardiology Physician:  Kerry Hough MD [facility]

## 2017-12-30 ENCOUNTER — Other Ambulatory Visit: Payer: Self-pay | Admitting: Nurse Practitioner

## 2017-12-30 DIAGNOSIS — R4182 Altered mental status, unspecified: Secondary | ICD-10-CM

## 2018-01-01 ENCOUNTER — Ambulatory Visit
Admission: RE | Admit: 2018-01-01 | Discharge: 2018-01-01 | Disposition: A | Discharge status: Home / Self Care | Payer: Medicare Other | Source: Ambulatory Visit | Attending: Nurse Practitioner | Admitting: Nurse Practitioner

## 2018-01-01 DIAGNOSIS — R4182 Altered mental status, unspecified: Secondary | ICD-10-CM

## 2018-01-01 MED ORDER — IOPAMIDOL (ISOVUE-300) INJECTION 61%
75.0000 mL | Freq: Once | INTRAVENOUS | Status: AC | PRN
  Administered 2018-01-01: 75 mL via INTRAVENOUS

## 2018-01-07 ENCOUNTER — Ambulatory Visit: Payer: Medicare Other

## 2018-01-08 ENCOUNTER — Ambulatory Visit (INDEPENDENT_AMBULATORY_CARE_PROVIDER_SITE_OTHER): Payer: Medicare Other | Admitting: Podiatry

## 2018-01-08 DIAGNOSIS — B351 Tinea unguium: Secondary | ICD-10-CM | POA: Diagnosis not present

## 2018-01-08 DIAGNOSIS — M79674 Pain in right toe(s): Secondary | ICD-10-CM | POA: Diagnosis not present

## 2018-01-08 DIAGNOSIS — E0843 Diabetes mellitus due to underlying condition with diabetic autonomic (poly)neuropathy: Secondary | ICD-10-CM

## 2018-01-08 DIAGNOSIS — M79675 Pain in left toe(s): Secondary | ICD-10-CM | POA: Diagnosis not present

## 2018-01-12 NOTE — Progress Notes (Signed)
   SUBJECTIVE Patient with a history of diabetes mellitus presents to office today complaining of elongated, thickened nails that cause pain while ambulating in shoes. She is unable to trim her own nails. Patient is here for further evaluation and treatment.   Past Medical History:  Diagnosis Date  . Anemia 09/25/2016  . Bipolar affective disorder ([place]) 04/29/2012  . Borderline personality disorder ([place])   . Chronic back pain   . Depression   . Diabetes mellitus ([place]) 04/29/2012  . GERD (gastroesophageal reflux disease)   . HTN (hypertension) 04/29/2012  . Hyperlipidemia   . Hypothyroidism   . Schizoaffective disorder ([place]) 04/29/2012  . Schizophrenia ([place])     OBJECTIVE General Patient is awake, alert, and oriented x 3 and in no acute distress. Derm Skin is dry and supple bilateral. Negative open lesions or macerations. Remaining integument unremarkable. Nails are tender, long, thickened and dystrophic with subungual debris, consistent with onychomycosis, 1-5 bilateral. No signs of infection noted. Vasc  DP and PT pedal pulses palpable bilaterally. Temperature gradient within normal limits.  Neuro Epicritic and protective threshold sensation diminished bilaterally.  Musculoskeletal Exam No symptomatic pedal deformities noted bilateral. Muscular strength within normal limits.  ASSESSMENT 1. Diabetes Mellitus w/ peripheral neuropathy 2. Onychomycosis of nail due to dermatophyte bilateral 3. Pain in foot bilateral  PLAN OF CARE 1. Patient evaluated today. 2. Instructed to maintain good pedal hygiene and foot care. Stressed importance of controlling blood sugar.  3. Mechanical debridement of nails 1-5 bilaterally performed using a nail nipper. Filed with dremel without incident.  4. Return to clinic in 3 mos.     Edrick Kins, DPM [facility]  Dr. Edrick Kins, [address]                                        [place], [place] [address]                 Office [phone]  Fax [phone]

## 2018-03-10 ENCOUNTER — Ambulatory Visit (INDEPENDENT_AMBULATORY_CARE_PROVIDER_SITE_OTHER): Payer: Medicare Other | Admitting: Neurology

## 2018-03-10 ENCOUNTER — Encounter: Payer: Self-pay | Admitting: Neurology

## 2018-03-10 ENCOUNTER — Telehealth: Payer: Self-pay | Admitting: Neurology

## 2018-03-10 ENCOUNTER — Encounter

## 2018-03-10 VITALS — BP 116/72 | HR 83 | Ht 64.0 in | Wt 227.0 lb

## 2018-03-10 DIAGNOSIS — R413 Other amnesia: Secondary | ICD-10-CM | POA: Diagnosis not present

## 2018-03-10 DIAGNOSIS — R930 Abnormal findings on diagnostic imaging of skull and head, not elsewhere classified: Secondary | ICD-10-CM | POA: Insufficient documentation

## 2018-03-10 MED ORDER — DONEPEZIL HCL 10 MG PO TABS: 10.0000 mg | ORAL_TABLET | Freq: Every day | ORAL | 11 refills | Status: DC

## 2018-03-10 NOTE — Telephone Encounter (Signed)
Medicare/medicaid order sent to [facility]. They will reach out to the pt to schedule.  °

## 2018-03-10 NOTE — Progress Notes (Signed)
PATIENT: [name] DOB: July 09, 1956  Chief Complaint  Patient presents with  . Altered Mental Status/Gait Problems    MMSE 29/30 - 6 animals.  She is here with her caregiver, [name].  The patient resides at [facility]. She is here to have her worsening memory and episodes of confusion further evaluated (started on donepezil 5mg  daily, two months ago).  She also notes more diffculty with walking (feeling as if she is falling forward).  She is using a cane to assist with ambulation.  Marland Kitchen PCP    Leonard Downing, MD     HISTORICAL  [name] is a 62 years old female, seen in refer by her primary care physician Dr. Arelia Sneddon, Curt Jews for evaluation of altered mental status, gait abnormality, initial evaluation was on March 11, 2018.  She is accompanied by her caregiver [name] at today's visit  She had a past medical history of bipolar disorder, personality disorder, also had a history of hypertension, diabetes, hyperlipidemia, she has lived in current group home for 12 years, [name] had close interaction with her over the past 3 years,  In April 2019, she had a sudden change from her baseline, she could not not dial her phone, she does not know how to get to her room, stop in the middle of the sentence, she was noticed to be more confused, more unsteady gait, this is a change from her baseline, when she can dress herself, does her own laundry, her symptoms last about 2 weeks, now she is back to her baseline, she was started on Aricept 5 mg daily, she has some GI side effect initially, not tolerating it better, she stated medicine has been very helpful  CT head with without contrast on January 01, 2018, when she was symptomatic showed mild to moderate atrophy, prominent ventriculomegaly, mild progression in 2013,  Her father had suffered Alzheimer's disease died at age 26  REVIEW OF SYSTEMS: Full 14 system review of systems performed and  notable only for depression anxiety not enough sleep, decreased energy  ALLERGIES: Allergies  Allergen Reactions  . Codeine     agitation    HOME MEDICATIONS: Current Outpatient Medications  Medication Sig Dispense Refill  . allopurinol (ZYLOPRIM) 100 MG tablet Take 100 mg by mouth 2 (two) times daily.    Marland Kitchen atorvastatin (LIPITOR) 40 MG tablet Take 40 mg by mouth daily at 6 PM.    . benztropine (COGENTIN) 2 MG tablet Take 2 mg by mouth 2 (two) times daily.    . divalproex (DEPAKOTE ER) 500 MG 24 hr tablet Take 1,000 mg by mouth at bedtime.     . donepezil (ARICEPT) 5 MG tablet Take 5 mg by mouth daily.    Marland Kitchen escitalopram (LEXAPRO) 20 MG tablet Take 20 mg by mouth every morning.     . ferrous gluconate (FERGON) 324 MG tablet Take 1 tablet (324 mg total) by mouth 2 (two) times daily with a meal. (Patient taking differently: Take 324 mg by mouth daily with breakfast. ) 60 tablet 1  . glipiZIDE (GLUCOTROL) 10 MG tablet Take 10 mg by mouth 2 (two) times daily before a meal.    . latanoprost (XALATAN) 0.005 % ophthalmic solution Place 1 drop into both eyes at bedtime.    Marland Kitchen levothyroxine (SYNTHROID, LEVOTHROID) 88 MCG tablet Take 88 mcg by mouth daily before breakfast.    . lisinopril (PRINIVIL,ZESTRIL) 10 MG tablet Take 10 mg by mouth daily.    Marland Kitchen  metFORMIN (GLUCOPHAGE) 1000 MG tablet Take 1,000 mg by mouth 2 (two) times daily with a meal.    . paliperidone (INVEGA SUSTENNA) 234 MG/1.5ML SUSP injection Inject 234 mg into the muscle every 30 (thirty) days.    . polyethylene glycol (MIRALAX / GLYCOLAX) packet Take 17 g by mouth daily.    . traZODone (DESYREL) 100 MG tablet Take 100 mg by mouth at bedtime.      No current facility-administered medications for this visit.     PAST MEDICAL HISTORY: Past Medical History:  Diagnosis Date  . Anemia 09/25/2016  . Bipolar affective disorder ([place]) 04/29/2012  . Borderline personality disorder ([place])   . Chronic back pain   . Depression   . Diabetes  mellitus ([place]) 04/29/2012  . Gait difficulty   . GERD (gastroesophageal reflux disease)   . HTN (hypertension) 04/29/2012  . Hyperlipidemia   . Hypothyroidism   . Memory loss   . Schizoaffective disorder ([place]) 04/29/2012  . Schizophrenia ([place])     PAST SURGICAL HISTORY: Past Surgical History:  Procedure Laterality Date  . APPENDECTOMY    . CHOLECYSTECTOMY    . COLONOSCOPY N/A 09/27/2016   Procedure: COLONOSCOPY;  Surgeon: Carol Ada, MD;  Location: [facility] ENDOSCOPY;  Service: Endoscopy;  Laterality: N/A;  . ESOPHAGOGASTRODUODENOSCOPY N/A 09/26/2016   Procedure: ESOPHAGOGASTRODUODENOSCOPY (EGD);  Surgeon: Juanita Craver, MD;  Location: [facility] ENDOSCOPY;  Service: Endoscopy;  Laterality: N/A;  . GIVENS CAPSULE STUDY N/A 09/27/2016   Procedure: GIVENS CAPSULE STUDY;  Surgeon: Carol Ada, MD;  Location: [facility];  Service: Endoscopy;  Laterality: N/A;  . KNEE SURGERY     right    FAMILY HISTORY: Family History  Problem Relation Age of Onset  . CVA Mother 105  . Dementia Father 54  . CAD Maternal Grandmother   . Colon cancer Maternal Grandfather     SOCIAL HISTORY:  Social History   Socioeconomic History  . Marital status: Single    Spouse name: Not on file  . Number of children: 0  . Years of education: one year college  . Highest education level: Not on file  Occupational History  . Occupation: disabled  Social Needs  . Financial resource strain: Not on file  . Food insecurity:    Worry: Not on file    Inability: Not on file  . Transportation needs:    Medical: Not on file    Non-medical: Not on file  Tobacco Use  . Smoking status: Former Smoker    Last attempt to quit: 2008    Years since quitting: 11.4  . Smokeless tobacco: Never Used  Substance and Sexual Activity  . Alcohol use: No    Comment: h/o use but none in years  . Drug use: No    Comment: remote h/o drug use - cocaine, marijuana, uppers, downers, qualudes  . Sexual activity: Never  Lifestyle  .  Physical activity:    Days per week: Not on file    Minutes per session: Not on file  . Stress: Not on file  Relationships  . Social connections:    Talks on phone: Not on file    Gets together: Not on file    Attends religious service: Not on file    Active member of club or organization: Not on file    Attends meetings of clubs or organizations: Not on file    Relationship status: Not on file  . Intimate partner violence:    Fear of current or ex partner:  Not on file    Emotionally abused: Not on file    Physically abused: Not on file    Forced sexual activity: Not on file  Other Topics Concern  . Not on file  Social History Narrative   Lives in [facility] with five other ladies and staff.   Right-handed.   Occasional caffeine use.     PHYSICAL EXAM   Vitals:   03/10/18 1401  BP: 116/72  Pulse: 83  Weight: 227 lb (103 kg)  Height: 5\' 4"  (1.626 m)    Not recorded      Body mass index is 38.96 kg/m.  PHYSICAL EXAMNIATION:  Gen: NAD, conversant, well nourised, obese, well groomed                     Cardiovascular: Regular rate rhythm, no peripheral edema, warm, nontender. Eyes: Conjunctivae clear without exudates or hemorrhage Neck: Supple, no carotid bruits. Pulmonary: Clear to auscultation bilaterally   NEUROLOGICAL EXAM:  MENTAL STATUS: MMSE - Mini Mental State Exam 03/10/2018  Orientation to time 5  Orientation to Place 5  Registration 3  Attention/ Calculation 5  Recall 2  Language- name 2 objects 2  Language- repeat 1  Language- follow 3 step command 3  Language- read & follow direction 1  Write a sentence 1  Copy design 1  Total score 29  Animal naming 6   CRANIAL NERVES: CN II: Visual fields are full to confrontation. Fundoscopic exam is normal with sharp discs and no vascular changes. Pupils are round equal and briskly reactive to light. CN III, IV, VI: extraocular movement are normal. No ptosis. CN V: Facial sensation  is intact to pinprick in all 3 divisions bilaterally. Corneal responses are intact.  CN VII: Face is symmetric with normal eye closure and smile. CN VIII: Hearing is normal to rubbing fingers CN IX, X: Palate elevates symmetrically. Phonation is normal. CN XI: Head turning and shoulder shrug are intact CN XII: Tongue is midline with normal movements and no atrophy.  MOTOR: There is no pronator drift of out-stretched arms. Muscle bulk and tone are normal. Muscle strength is normal.  REFLEXES: Reflexes are 2+ and symmetric at the biceps, triceps, knees, and ankles. Plantar responses are flexor.  SENSORY: Intact to light touch, pinprick, positional sensation and vibratory sensation are intact in fingers and toes.  COORDINATION: Rapid alternating movements and fine finger movements are intact. There is no dysmetria on finger-to-nose and heel-knee-shin.    GAIT/STANCE: She needs pushed up to get up from seated position, rely on her walker, unsteady Romberg is absent.   DIAGNOSTIC DATA (LABS, IMAGING, TESTING) - I reviewed patient records, labs, notes, testing and imaging myself where available.   ASSESSMENT AND PLAN  ROSALEAH PERSON is a 62 y.o. female   Evidence of generalized atrophy, ventriculomegaly,  Repeat MRI of the brain without contrast  Mild cognitive impairment  Family history of Alzheimer's disease, she has been treated with polypharmacy for mood disorder for many years,  Laboratory evaluation to rule out treatable etiology  Increase Aricept to 10 mg daily   Marcial Pacas, M.D. Ph.D.  [facility] [address] [place], [place] [address] Ph: [phone] Fax: [phone]  CC:  Leonard Downing, MD

## 2018-03-11 ENCOUNTER — Telehealth: Payer: Self-pay | Admitting: Neurology

## 2018-03-11 LAB — COMPREHENSIVE METABOLIC PANEL
ALT: 7 IU/L (ref 0–32)
AST: 11 IU/L (ref 0–40)
Albumin/Globulin Ratio: 1.7 (ref 1.2–2.2)
Albumin: 4.2 g/dL (ref 3.6–4.8)
Alkaline Phosphatase: 44 IU/L (ref 39–117)
BUN/Creatinine Ratio: 10 — ABNORMAL LOW (ref 12–28)
BUN: 10 mg/dL (ref 8–27)
Bilirubin Total: <0.2 mg/dL (ref 0.0–1.2)
CO2: 24 mmol/L (ref 20–29)
Calcium: 9.8 mg/dL (ref 8.7–10.3)
Chloride: 102 mmol/L (ref 96–106)
Creatinine, Ser: 0.96 mg/dL (ref 0.57–1.00)
GFR calc Af Amer: 74 mL/min/{1.73_m2} (ref >59)
GFR calc non Af Amer: 64 mL/min/{1.73_m2} (ref >59)
Globulin, Total: 2.5 g/dL (ref 1.5–4.5)
Glucose: 100 mg/dL — ABNORMAL HIGH (ref 65–99)
Potassium: 4.9 mmol/L (ref 3.5–5.2)
Sodium: 140 mmol/L (ref 134–144)
Total Protein: 6.7 g/dL (ref 6.0–8.5)

## 2018-03-11 LAB — FOLATE: Folate: 15.2 ng/mL (ref >3.0)

## 2018-03-11 LAB — HIV ANTIBODY (ROUTINE TESTING W REFLEX): HIV Screen 4th Generation wRfx: NONREACTIVE

## 2018-03-11 LAB — CBC
Hematocrit: 34 % (ref 34.0–46.6)
Hemoglobin: 11.5 g/dL (ref 11.1–15.9)
MCH: 29.3 pg (ref 26.6–33.0)
MCHC: 33.8 g/dL (ref 31.5–35.7)
MCV: 87 fL (ref 79–97)
Platelets: 343 10*3/uL (ref 150–450)
RBC: 3.92 x10E6/uL (ref 3.77–5.28)
RDW: 16.3 % — ABNORMAL HIGH (ref 12.3–15.4)
WBC: 7 10*3/uL (ref 3.4–10.8)

## 2018-03-11 LAB — RPR: RPR Ser Ql: NONREACTIVE

## 2018-03-11 LAB — VALPROIC ACID LEVEL: Valproic Acid Lvl: 57 ug/mL (ref 50–100)

## 2018-03-11 LAB — TSH: TSH: 3.1 u[IU]/mL (ref 0.450–4.500)

## 2018-03-11 LAB — C-REACTIVE PROTEIN: CRP: <1 mg/L (ref 0–10)

## 2018-03-11 LAB — HGB A1C W/O EAG: Hgb A1c MFr Bld: 6.8 % — ABNORMAL HIGH (ref 4.8–5.6)

## 2018-03-11 LAB — VITAMIN B12: Vitamin B-12: 582 pg/mL (ref 232–1245)

## 2018-03-11 NOTE — Telephone Encounter (Signed)
Please call patient, laboratory evaluation showed A1c 6.8, rest of the laboratory evaluation showed no significant abnormality.

## 2018-03-11 NOTE — Telephone Encounter (Addendum)
The patient resides at [facility].  I spoke to Asencion Noble who is on the patient's DPR and provided the lab results.  She verbalized understanding.  The call was followed by the lab results being faxed and confirmed to her facility at [phone].

## 2018-03-18 ENCOUNTER — Ambulatory Visit
Admission: RE | Admit: 2018-03-18 | Discharge: 2018-03-18 | Disposition: A | Discharge status: Home / Self Care | Payer: Medicare Other | Source: Ambulatory Visit | Attending: Neurology | Admitting: Neurology

## 2018-03-18 DIAGNOSIS — R413 Other amnesia: Secondary | ICD-10-CM | POA: Diagnosis not present

## 2018-03-24 ENCOUNTER — Telehealth: Payer: Self-pay | Admitting: Neurology

## 2018-03-24 NOTE — Telephone Encounter (Signed)
Please call patient, MRI of the brain showed mild atrophy, enlarged ventricle, no acute abnormality,  IMPRESSION:   MRI brain (without) demonstrating: 1. Mild perisylvian and mesial temporal atrophy. Mild ventriculomegaly on ex vacuo basis. 2. No acute findings.

## 2018-03-24 NOTE — Telephone Encounter (Signed)
Patient resides at [facility].  I have provided her MRI results to her caregiver on DPR Ruby Cola).  She verbalized understanding.  The patient will keep her pending follow up for further review.

## 2018-04-09 ENCOUNTER — Ambulatory Visit (INDEPENDENT_AMBULATORY_CARE_PROVIDER_SITE_OTHER): Payer: Medicare Other | Admitting: Podiatry

## 2018-04-09 DIAGNOSIS — B351 Tinea unguium: Secondary | ICD-10-CM | POA: Diagnosis not present

## 2018-04-09 DIAGNOSIS — M79675 Pain in left toe(s): Secondary | ICD-10-CM | POA: Diagnosis not present

## 2018-04-09 DIAGNOSIS — M79674 Pain in right toe(s): Secondary | ICD-10-CM

## 2018-04-11 NOTE — Progress Notes (Signed)
   SUBJECTIVE Patient with a history of diabetes mellitus presents to office today complaining of elongated, thickened nails that cause pain while ambulating in shoes.  She is unable to trim her own nails. Patient is here for further evaluation and treatment.   Past Medical History:  Diagnosis Date  . Anemia 09/25/2016  . Bipolar affective disorder ([facility]) 04/29/2012  . Borderline personality disorder ([facility])   . Chronic back pain   . Depression   . Diabetes mellitus ([facility]) 04/29/2012  . Gait difficulty   . GERD (gastroesophageal reflux disease)   . HTN (hypertension) 04/29/2012  . Hyperlipidemia   . Hypothyroidism   . Memory loss   . Schizoaffective disorder ([facility]) 04/29/2012  . Schizophrenia ([facility])     OBJECTIVE General Patient is awake, alert, and oriented x 3 and in no acute distress. Derm Skin is dry and supple bilateral. Negative open lesions or macerations. Remaining integument unremarkable. Nails are tender, long, thickened and dystrophic with subungual debris, consistent with onychomycosis, 1-5 bilateral. No signs of infection noted. Vasc  DP and PT pedal pulses palpable bilaterally. Temperature gradient within normal limits.  Neuro Epicritic and protective threshold sensation diminished bilaterally.  Musculoskeletal Exam No symptomatic pedal deformities noted bilateral. Muscular strength within normal limits.  ASSESSMENT 1. Diabetes Mellitus w/ peripheral neuropathy 2. Onychomycosis of nail due to dermatophyte bilateral 3. Pain in foot bilateral  PLAN OF CARE 1. Patient evaluated today. 2. Instructed to maintain good pedal hygiene and foot care. Stressed importance of controlling blood sugar.  3. Mechanical debridement of nails 1-5 bilaterally performed using a nail nipper. Filed with dremel without incident.  4. Return to clinic in 3 mos.     Brent M. Evans, DPM [facility]  Dr. Brent M. Evans, DPM    [address]                                         [place], [place] [address]                Office [phone]  Fax [phone]      

## 2018-06-25 ENCOUNTER — Ambulatory Visit: Payer: Medicare Other | Admitting: Neurology

## 2018-06-25 ENCOUNTER — Telehealth: Payer: Self-pay | Admitting: *Deleted
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
## 2018-07-10 ENCOUNTER — Ambulatory Visit (INDEPENDENT_AMBULATORY_CARE_PROVIDER_SITE_OTHER): Payer: Medicare Other | Admitting: Podiatry

## 2018-07-10 ENCOUNTER — Encounter: Payer: Self-pay | Admitting: Podiatry

## 2018-07-10 DIAGNOSIS — B351 Tinea unguium: Secondary | ICD-10-CM

## 2018-07-10 DIAGNOSIS — M79674 Pain in right toe(s): Secondary | ICD-10-CM

## 2018-07-10 DIAGNOSIS — M79675 Pain in left toe(s): Secondary | ICD-10-CM

## 2018-07-10 DIAGNOSIS — E1142 Type 2 diabetes mellitus with diabetic polyneuropathy: Secondary | ICD-10-CM

## 2018-07-24 NOTE — Progress Notes (Signed)
Subjective: [name] presents today with history of diabetic  neuropathy and cc of painful, mycotic toenails.  Pain is aggravated when wearing enclosed shoe gear and relieved with periodic professional debridement.  Pt voices no new problems on today's visit  Objective:  Vascular Examination: Capillary refill time <3 seconds Dorsalis pedis and Posterior tibial pulses were palpable b/l Digital hair absent x 10 digits Skin temperature gradient WNL b/l  Dermatological Examination: Skin with normal turgor, texture and tone b/l Toenails 1-5 b/l discolored, thick, dystrophic with subungual debris and pain with palpation to nailbeds due to thickness of nails.  Musculoskeletal: Muscle strength 5/5 b/l  Neurological: Sensation with 10 gram monofilament diminished Vibratory sensation diminished.  Assessment: 1. Painful onychomycosis toenails 1-5 b/l 2. NIDDM with neuropathy  Plan: 1. Toenails 1-5 b/l were debrided in length and girth without iatrogenic bleeding. 2. Patient to continue soft, supportive shoe gear 3. Patient to report any pedal injuries to medical professional  4. Follow up 3 months. Patient/POA to call should there be a concern in the interim.

## 2018-09-11 ENCOUNTER — Ambulatory Visit (INDEPENDENT_AMBULATORY_CARE_PROVIDER_SITE_OTHER): Payer: Medicare Other | Admitting: Neurology

## 2018-09-11 ENCOUNTER — Encounter: Payer: Self-pay | Admitting: Neurology

## 2018-09-11 ENCOUNTER — Telehealth: Payer: Self-pay | Admitting: *Deleted

## 2018-09-11 VITALS — BP 126/72 | HR 68 | Ht 64.0 in | Wt 224.0 lb

## 2018-09-11 DIAGNOSIS — R413 Other amnesia: Secondary | ICD-10-CM

## 2018-09-11 DIAGNOSIS — R930 Abnormal findings on diagnostic imaging of skull and head, not elsewhere classified: Secondary | ICD-10-CM

## 2018-09-11 DIAGNOSIS — R269 Unspecified abnormalities of gait and mobility: Secondary | ICD-10-CM | POA: Insufficient documentation

## 2018-09-11 NOTE — Progress Notes (Signed)
PATIENT: [name] DOB: [birth year]  Chief Complaint  Patient presents with  . Memory Loss    MMSE 30/30 - 8 animals.  She resides at [facility].  She is here with her sister, Faythe Dingwall.  States she has good and bad days with her memory.  Overall, feels it is stable.     HISTORICAL  [name] is a 62 years old female, seen in refer by her primary care physician Dr. Arelia Sneddon, Curt Jews for evaluation of altered mental status, gait abnormality, initial evaluation was on March 11, 2018.  She is accompanied by her caregiver Ruby Cola at today's visit  She had a past medical history of bipolar disorder, personality disorder, also had a history of hypertension, diabetes, hyperlipidemia, she has lived in current group home for 12 years, Gertie Fey had close interaction with her over the past 3 years,  In April 2019, she had a sudden change from her baseline, she could not not dial her phone, she does not know how to get to her room, stop in the middle of the sentence, she was noticed to be more confused, more unsteady gait, this is a change from her baseline, when she can dress herself, does her own laundry, her symptoms last about 2 weeks, now she is back to her baseline, she was started on Aricept 5 mg daily, she has some GI side effect initially, not tolerating it better, she stated medicine has been very helpful  CT head with without contrast on January 01, 2018, when she was symptomatic showed mild to moderate atrophy, prominent ventriculomegaly, mild progression in 2013,  Her father had suffered Alzheimer's disease died at age 34  UPDATE 10-09-18: She is with her sister at today's visit, she was started on aricept since last visit in July 2019, sister noticed some improvement, she continues to have slow thinking, mild unsteady but no significant worsening,  We personally reviewed MRI of brain in July 2019: Mild perisylvian and mesial temporal lobe atrophy,  enlarged ventricle, no acute abnormality.  Laboratory evaluation showed mild elevated A1c 6.8, CBC showed mild elevated RDW of 16.3, normal CMP, negative HIV, RPR, normal B12, C-reactive protein, TSH, folic acid  REVIEW OF SYSTEMS: Full 14 system review of systems performed and notable only for as above  All rest review of the system were negative.  ALLERGIES: Allergies  Allergen Reactions  . Codeine     agitation    HOME MEDICATIONS: Current Outpatient Medications  Medication Sig Dispense Refill  . allopurinol (ZYLOPRIM) 100 MG tablet Take 100 mg by mouth 2 (two) times daily.    Marland Kitchen atorvastatin (LIPITOR) 40 MG tablet Take 40 mg by mouth daily at 6 PM.    . benztropine (COGENTIN) 2 MG tablet Take 2 mg by mouth 2 (two) times daily.    . divalproex (DEPAKOTE ER) 500 MG 24 hr tablet Take 1,000 mg by mouth at bedtime.     . donepezil (ARICEPT) 10 MG tablet Take 1 tablet (10 mg total) by mouth daily. 30 tablet 11  . escitalopram (LEXAPRO) 20 MG tablet Take 20 mg by mouth every morning.     . ferrous gluconate (FERGON) 324 MG tablet Take 1 tablet (324 mg total) by mouth 2 (two) times daily with a meal. (Patient taking differently: Take 324 mg by mouth daily with breakfast. ) 60 tablet 1  . glipiZIDE (GLUCOTROL) 10 MG tablet Take 10 mg by mouth 2 (two) times daily before  a meal.    . latanoprost (XALATAN) 0.005 % ophthalmic solution Place 1 drop into both eyes at bedtime.    Marland Kitchen levothyroxine (SYNTHROID, LEVOTHROID) 88 MCG tablet Take 88 mcg by mouth daily before breakfast.    . lisinopril (PRINIVIL,ZESTRIL) 10 MG tablet Take 10 mg by mouth daily.    . metFORMIN (GLUCOPHAGE) 1000 MG tablet Take 1,000 mg by mouth 2 (two) times daily with a meal.    . paliperidone (INVEGA SUSTENNA) 234 MG/1.5ML SUSP injection Inject 234 mg into the muscle every 30 (thirty) days.    . polyethylene glycol (MIRALAX / GLYCOLAX) packet Take 17 g by mouth daily.    . traZODone (DESYREL) 100 MG tablet Take 100 mg by  mouth at bedtime.      No current facility-administered medications for this visit.     PAST MEDICAL HISTORY: Past Medical History:  Diagnosis Date  . Anemia 09/25/2016  . Bipolar affective disorder ([place]) 04/29/2012  . Borderline personality disorder ([place])   . Chronic back pain   . Depression   . Diabetes mellitus ([place]) 04/29/2012  . Gait difficulty   . GERD (gastroesophageal reflux disease)   . HTN (hypertension) 04/29/2012  . Hyperlipidemia   . Hypothyroidism   . Memory loss   . Schizoaffective disorder ([place]) 04/29/2012  . Schizophrenia ([place])     PAST SURGICAL HISTORY: Past Surgical History:  Procedure Laterality Date  . APPENDECTOMY    . CHOLECYSTECTOMY    . COLONOSCOPY N/A 09/27/2016   Procedure: COLONOSCOPY;  Surgeon: Carol Ada, MD;  Location: [facility] ENDOSCOPY;  Service: Endoscopy;  Laterality: N/A;  . ESOPHAGOGASTRODUODENOSCOPY N/A 09/26/2016   Procedure: ESOPHAGOGASTRODUODENOSCOPY (EGD);  Surgeon: Juanita Craver, MD;  Location: [facility] ENDOSCOPY;  Service: Endoscopy;  Laterality: N/A;  . GIVENS CAPSULE STUDY N/A 09/27/2016   Procedure: GIVENS CAPSULE STUDY;  Surgeon: Carol Ada, MD;  Location: [place];  Service: Endoscopy;  Laterality: N/A;  . KNEE SURGERY     right    FAMILY HISTORY: Family History  Problem Relation Age of Onset  . CVA Mother 52  . Dementia Father 39  . CAD Maternal Grandmother   . Colon cancer Maternal Grandfather     SOCIAL HISTORY:  Social History   Socioeconomic History  . Marital status: Single    Spouse name: Not on file  . Number of children: 0  . Years of education: one year college  . Highest education level: Not on file  Occupational History  . Occupation: disabled  Social Needs  . Financial resource strain: Not on file  . Food insecurity:    Worry: Not on file    Inability: Not on file  . Transportation needs:    Medical: Not on file    Non-medical: Not on file  Tobacco Use  . Smoking status: Former Smoker    Last  attempt to quit: 2008    Years since quitting: 11.9  . Smokeless tobacco: Never Used  Substance and Sexual Activity  . Alcohol use: No    Comment: h/o use but none in years  . Drug use: No    Comment: remote h/o drug use - cocaine, marijuana, uppers, downers, qualudes  . Sexual activity: Never  Lifestyle  . Physical activity:    Days per week: Not on file    Minutes per session: Not on file  . Stress: Not on file  Relationships  . Social connections:    Talks on phone: Not on file    Gets together: Not  on file    Attends religious service: Not on file    Active member of club or organization: Not on file    Attends meetings of clubs or organizations: Not on file    Relationship status: Not on file  . Intimate partner violence:    Fear of current or ex partner: Not on file    Emotionally abused: Not on file    Physically abused: Not on file    Forced sexual activity: Not on file  Other Topics Concern  . Not on file  Social History Narrative   Lives in [facility] with five other ladies and staff.   Right-handed.   Occasional caffeine use.     PHYSICAL EXAM   Vitals:   09/11/18 1340  BP: 126/72  Pulse: 68  Weight: 224 lb (101.6 kg)  Height: 5\' 4"  (1.626 m)    Not recorded      Body mass index is 38.45 kg/m.  PHYSICAL EXAMNIATION:  Gen: NAD, conversant, well nourised, obese, well groomed                     Cardiovascular: Regular rate rhythm, no peripheral edema, warm, nontender. Eyes: Conjunctivae clear without exudates or hemorrhage Neck: Supple, no carotid bruits. Pulmonary: Clear to auscultation bilaterally   NEUROLOGICAL EXAM:  MENTAL STATUS: MMSE - Mini Mental State Exam 09/11/2018 09/11/2018 03/10/2018  Orientation to time 5 5 5   Orientation to Place 5 5 5   Registration 3 3 3   Attention/ Calculation 5 5 5   Recall 3 2 2   Language- name 2 objects 2 2 2   Language- repeat 1 1 1   Language- follow 3 step command 3 3 3   Language-  read & follow direction 1 1 1   Write a sentence 1 1 1   Copy design 1 1 1   Total score 30 29 29   Animal naming 7   CRANIAL NERVES: CN II: Visual fields are full to confrontation.Pupils are round equal and briskly reactive to light. CN III, IV, VI: extraocular movement are normal. No ptosis. CN V: Facial sensation is intact to pinprick in all 3 divisions bilaterally. Corneal responses are intact.  CN VII: Face is symmetric with normal eye closure and smile. CN VIII: Hearing is normal to rubbing fingers CN IX, X: Palate elevates symmetrically. Phonation is normal. CN XI: Head turning and shoulder shrug are intact CN XII: Tongue is midline with normal movements and no atrophy.  MOTOR: There is no pronator drift of out-stretched arms. Muscle bulk and tone are normal. Muscle strength is normal.  REFLEXES: Reflexes are 2+ and symmetric at the biceps, triceps, knees, and ankles. Plantar responses are flexor.  SENSORY: Intact to light touch, pinprick, positional sensation and vibratory sensation are intact in fingers and toes.  COORDINATION: Rapid alternating movements and fine finger movements are intact. There is no dysmetria on finger-to-nose and heel-knee-shin.    GAIT/STANCE: She needs pushed up to get up from seated position, rely on her walker, unsteady Romberg is absent.   DIAGNOSTIC DATA (LABS, IMAGING, TESTING) - I reviewed patient records, labs, notes, testing and imaging myself where available.   ASSESSMENT AND PLAN  [name] is a 62 y.o. female    Mild cognitive impairment  Family history of Alzheimer's disease, she has been treated with polypharmacy for mood disorder for many years,  Continue Aricept 10 mg daily  For memory loss likely a combination of mood disorder, polypharmacy, generalized brain atrophy with underlying  central nervous system degenerative process,  Only return to clinic for new issues, also encouraged her moderate exercise  .  Marcial Pacas,  M.D. Ph.D.  [facility] [address], [place], [place] [address] Ph: [phone] Fax: [phone]  CC:  Leonard Downing, MD

## 2018-09-11 NOTE — Telephone Encounter (Signed)
[facility] called back and stated patient will be able to keep her appt today.

## 2018-09-11 NOTE — Telephone Encounter (Signed)
Received call to cancel follow up due to sickness.

## 2018-10-09 ENCOUNTER — Other Ambulatory Visit: Payer: Self-pay | Admitting: Family Medicine

## 2018-10-09 ENCOUNTER — Ambulatory Visit (INDEPENDENT_AMBULATORY_CARE_PROVIDER_SITE_OTHER): Payer: Medicare Other | Admitting: Podiatry

## 2018-10-09 DIAGNOSIS — M79675 Pain in left toe(s): Secondary | ICD-10-CM

## 2018-10-09 DIAGNOSIS — M79674 Pain in right toe(s): Secondary | ICD-10-CM

## 2018-10-09 DIAGNOSIS — Z1231 Encounter for screening mammogram for malignant neoplasm of breast: Secondary | ICD-10-CM

## 2018-10-09 DIAGNOSIS — B351 Tinea unguium: Secondary | ICD-10-CM | POA: Diagnosis not present

## 2018-10-09 NOTE — Patient Instructions (Addendum)
Diabetes Mellitus and Foot Care Foot care is an important part of your health, especially when you have diabetes. Diabetes may cause you to have problems because of poor blood flow (circulation) to your feet and legs, which can cause your skin to:  Become thinner and drier.  Break more easily.  Heal more slowly.  Peel and crack. You may also have nerve damage (neuropathy) in your legs and feet, causing decreased feeling in them. This means that you may not notice minor injuries to your feet that could lead to more serious problems. Noticing and addressing any potential problems early is the best way to prevent future foot problems. How to care for your feet Foot hygiene  Wash your feet daily with warm water and mild soap. Do not use hot water. Then, pat your feet and the areas between your toes until they are completely dry. Do not soak your feet as this can dry your skin.  Trim your toenails straight across. Do not dig under them or around the cuticle. File the edges of your nails with an emery board or nail file.  Apply a moisturizing lotion or petroleum jelly to the skin on your feet and to dry, brittle toenails. Use lotion that does not contain alcohol and is unscented. Do not apply lotion between your toes. Shoes and socks  Wear clean socks or stockings every day. Make sure they are not too tight. Do not wear knee-high stockings since they may decrease blood flow to your legs.  Wear shoes that fit properly and have enough cushioning. Always look in your shoes before you put them on to be sure there are no objects inside.  To break in new shoes, wear them for just a few hours a day. This prevents injuries on your feet. Wounds, scrapes, corns, and calluses  Check your feet daily for blisters, cuts, bruises, sores, and redness. If you cannot see the bottom of your feet, use a mirror or ask someone for help.  Do not cut corns or calluses or try to remove them with medicine.  If you  find a minor scrape, cut, or break in the skin on your feet, keep it and the skin around it clean and dry. You may clean these areas with mild soap and water. Do not clean the area with peroxide, alcohol, or iodine.  If you have a wound, scrape, corn, or callus on your foot, look at it several times a day to make sure it is healing and not infected. Check for: ? Redness, swelling, or pain. ? Fluid or blood. ? Warmth. ? Pus or a bad smell. General instructions  Do not cross your legs. This may decrease blood flow to your feet.  Do not use heating pads or hot water bottles on your feet. They may burn your skin. If you have lost feeling in your feet or legs, you may not know this is happening until it is too late.  Protect your feet from hot and cold by wearing shoes, such as at the beach or on hot pavement.  Schedule a complete foot exam at least once a year (annually) or more often if you have foot problems. If you have foot problems, report any cuts, sores, or bruises to your health care provider immediately. Contact a health care provider if:  You have a medical condition that increases your risk of infection and you have any cuts, sores, or bruises on your feet.  You have an injury that is not   healing.  You have redness on your legs or feet.  You feel burning or tingling in your legs or feet.  You have pain or cramps in your legs and feet.  Your legs or feet are numb.  Your feet always feel cold.  You have pain around a toenail. Get help right away if:  You have a wound, scrape, corn, or callus on your foot and: ? You have pain, swelling, or redness that gets worse. ? You have fluid or blood coming from the wound, scrape, corn, or callus. ? Your wound, scrape, corn, or callus feels warm to the touch. ? You have pus or a bad smell coming from the wound, scrape, corn, or callus. ? You have a fever. ? You have a red line going up your leg. Summary  Check your feet every day  for cuts, sores, red spots, swelling, and blisters.  Moisturize feet and legs daily.  Wear shoes that fit properly and have enough cushioning.  If you have foot problems, report any cuts, sores, or bruises to your health care provider immediately.  Schedule a complete foot exam at least once a year (annually) or more often if you have foot problems. This information is not intended to replace advice given to you by your health care provider. Make sure you discuss any questions you have with your health care provider. Document Released: 08/31/2000 Document Revised: 10/16/2017 Document Reviewed: 10/05/2016 Elsevier Interactive Patient Education  2019 Elsevier Inc.  Onychomycosis/Fungal Toenails  WHAT IS IT? An infection that lies within the keratin of your nail plate that is caused by a fungus.  WHY ME? Fungal infections affect all ages, sexes, races, and creeds.  There may be many factors that predispose you to a fungal infection such as age, coexisting medical conditions such as diabetes, or an autoimmune disease; stress, medications, fatigue, genetics, etc.  Bottom line: fungus thrives in a warm, moist environment and your shoes offer such a location.  IS IT CONTAGIOUS? Theoretically, yes.  You do not want to share shoes, nail clippers or files with someone who has fungal toenails.  Walking around barefoot in the same room or sleeping in the same bed is unlikely to transfer the organism.  It is important to realize, however, that fungus can spread easily from one nail to the next on the same foot.  HOW DO WE TREAT THIS?  There are several ways to treat this condition.  Treatment may depend on many factors such as age, medications, pregnancy, liver and kidney conditions, etc.  It is best to ask your doctor which options are available to you.  1. No treatment.   Unlike many other medical concerns, you can live with this condition.  However for many people this can be a painful condition and  may lead to ingrown toenails or a bacterial infection.  It is recommended that you keep the nails cut short to help reduce the amount of fungal nail. 2. Topical treatment.  These range from herbal remedies to prescription strength nail lacquers.  About 40-50% effective, topicals require twice daily application for approximately 9 to 12 months or until an entirely new nail has grown out.  The most effective topicals are medical grade medications available through physicians offices. 3. Oral antifungal medications.  With an 80-90% cure rate, the most common oral medication requires 3 to 4 months of therapy and stays in your system for a year as the new nail grows out.  Oral antifungal medications do require   blood work to make sure it is a safe drug for you.  A liver function panel will be performed prior to starting the medication and after the first month of treatment.  It is important to have the blood work performed to avoid any harmful side effects.  In general, this medication safe but blood work is required. 4. Laser Therapy.  This treatment is performed by applying a specialized laser to the affected nail plate.  This therapy is noninvasive, fast, and non-painful.  It is not covered by insurance and is therefore, out of pocket.  The results have been very good with a 80-95% cure rate.  The [facility] is the only practice in the area to offer this therapy. 5. Permanent Nail Avulsion.  Removing the entire nail so that a new nail will not grow back. 

## 2018-10-21 ENCOUNTER — Encounter: Payer: Self-pay | Admitting: Podiatry

## 2018-10-21 NOTE — Progress Notes (Signed)
Subjective: [name] is accompanied by her caregiver. Shepresents today with painful, thick toenails 1-5 b/l that she cannot cut and which interfere with daily activities.  Pain is aggravated when wearing enclosed shoe gear.  She is a resident at [facility]. She nor the caregiver voice any new concerns on today's visit.  Leonard Downing, MD is her PCP.   Current Outpatient Medications:  .  ACCU-CHEK FASTCLIX LANCETS MISC, , Disp: , Rfl:  .  ACCU-CHEK GUIDE test strip, , Disp: , Rfl:  .  allopurinol (ZYLOPRIM) 100 MG tablet, Take 100 mg by mouth 2 (two) times daily., Disp: , Rfl:  .  atorvastatin (LIPITOR) 40 MG tablet, Take 40 mg by mouth daily at 6 PM., Disp: , Rfl:  .  benztropine (COGENTIN) 2 MG tablet, Take 2 mg by mouth 2 (two) times daily., Disp: , Rfl:  .  Blood Glucose Monitoring Suppl (ACCU-CHEK GUIDE) w/Device KIT, , Disp: , Rfl:  .  divalproex (DEPAKOTE ER) 500 MG 24 hr tablet, Take 1,000 mg by mouth at bedtime. , Disp: , Rfl:  .  donepezil (ARICEPT) 10 MG tablet, Take 1 tablet (10 mg total) by mouth daily., Disp: 30 tablet, Rfl: 11 .  escitalopram (LEXAPRO) 20 MG tablet, Take 20 mg by mouth every morning. , Disp: , Rfl:  .  famotidine (PEPCID) 40 MG tablet, , Disp: , Rfl:  .  ferrous gluconate (FERGON) 324 MG tablet, Take 1 tablet (324 mg total) by mouth 2 (two) times daily with a meal. (Patient taking differently: Take 324 mg by mouth daily with breakfast. ), Disp: 60 tablet, Rfl: 1 .  glipiZIDE (GLUCOTROL) 10 MG tablet, Take 10 mg by mouth 2 (two) times daily before a meal., Disp: , Rfl:  .  latanoprost (XALATAN) 0.005 % ophthalmic solution, Place 1 drop into both eyes at bedtime., Disp: , Rfl:  .  levothyroxine (SYNTHROID, LEVOTHROID) 100 MCG tablet, , Disp: , Rfl:  .  levothyroxine (SYNTHROID, LEVOTHROID) 88 MCG tablet, Take 88 mcg by mouth daily before breakfast., Disp: , Rfl:  .  lisinopril (PRINIVIL,ZESTRIL) 10 MG tablet, Take 10 mg by mouth  daily., Disp: , Rfl:  .  lisinopril (PRINIVIL,ZESTRIL) 5 MG tablet, , Disp: , Rfl:  .  metFORMIN (GLUCOPHAGE) 1000 MG tablet, Take 1,000 mg by mouth 2 (two) times daily with a meal., Disp: , Rfl:  .  ONGLYZA 5 MG TABS tablet, , Disp: , Rfl:  .  paliperidone (INVEGA SUSTENNA) 234 MG/1.5ML SUSP injection, Inject 234 mg into the muscle every 30 (thirty) days., Disp: , Rfl:  .  Paliperidone 1.5 MG TB24, , Disp: , Rfl:  .  polyethylene glycol (MIRALAX / GLYCOLAX) packet, Take 17 g by mouth daily., Disp: , Rfl:  .  propranolol ER (INDERAL LA) 80 MG 24 hr capsule, , Disp: , Rfl:  .  ranitidine (ZANTAC) 300 MG tablet, , Disp: , Rfl:  .  traZODone (DESYREL) 100 MG tablet, Take 100 mg by mouth at bedtime. , Disp: , Rfl:   Allergies  Allergen Reactions  . Codeine     agitation    Objective:  Vascular Examination: Capillary refill time <3 seconds x 10 digits  Dorsalis pedis and Posterior tibial pulses palpable b/l  Digital hair absent x 10 digits  Skin temperature gradient WNL b/l  Dermatological Examination: Skin with normal turgor, texture and tone b/l  Toenails 1-5 b/l discolored, thick, dystrophic with subungual debris and pain with palpation to nailbeds due to  thickness of nails.  Musculoskeletal: Muscle strength 5/5 to all LE muscle groups  No gross bony deformities b/l.  No pain, crepitus or joint limitation noted with ROM.   Neurological: Sensation diminished with 10 gram monofilament. Vibratory sensation diminished  Assessment: Painful onychomycosis toenails 1-5 b/l   Plan: 1. Toenails 1-5 b/l were debrided in length and girth without iatrogenic bleeding. 2. Patient to continue soft, supportive shoe gear 3. Patient to report any pedal injuries to medical professional immediately. 4. Follow up 3 months.  5. Patient/POA to call should there be a concern in the interim.

## 2018-10-22 ENCOUNTER — Other Ambulatory Visit (HOSPITAL_BASED_OUTPATIENT_CLINIC_OR_DEPARTMENT_OTHER): Payer: Self-pay

## 2018-10-22 DIAGNOSIS — R5383 Other fatigue: Secondary | ICD-10-CM

## 2018-10-22 DIAGNOSIS — G4730 Sleep apnea, unspecified: Secondary | ICD-10-CM

## 2018-10-22 DIAGNOSIS — G4710 Hypersomnia, unspecified: Secondary | ICD-10-CM

## 2018-10-22 DIAGNOSIS — R0683 Snoring: Secondary | ICD-10-CM

## 2018-11-12 ENCOUNTER — Ambulatory Visit
Admission: RE | Admit: 2018-11-12 | Discharge: 2018-11-12 | Disposition: A | Discharge status: Home / Self Care | Payer: Medicaid Other | Source: Ambulatory Visit | Attending: Family Medicine | Admitting: Family Medicine

## 2018-11-12 DIAGNOSIS — Z1231 Encounter for screening mammogram for malignant neoplasm of breast: Secondary | ICD-10-CM

## 2018-12-10 ENCOUNTER — Encounter (HOSPITAL_BASED_OUTPATIENT_CLINIC_OR_DEPARTMENT_OTHER): Payer: Self-pay

## 2018-12-16 ENCOUNTER — Ambulatory Visit: Payer: Medicare Other | Admitting: Neurology

## 2019-01-08 ENCOUNTER — Ambulatory Visit: Payer: Medicare Other | Admitting: Podiatry

## 2019-01-12 ENCOUNTER — Ambulatory Visit: Payer: Medicare Other | Admitting: Podiatry

## 2019-01-21 ENCOUNTER — Other Ambulatory Visit: Payer: Self-pay | Admitting: Neurology

## 2019-01-26 ENCOUNTER — Other Ambulatory Visit: Payer: Self-pay

## 2019-01-26 ENCOUNTER — Ambulatory Visit (INDEPENDENT_AMBULATORY_CARE_PROVIDER_SITE_OTHER): Payer: Medicare Other | Admitting: Podiatry

## 2019-01-26 ENCOUNTER — Encounter: Payer: Self-pay | Admitting: Podiatry

## 2019-01-26 DIAGNOSIS — B351 Tinea unguium: Secondary | ICD-10-CM | POA: Diagnosis not present

## 2019-01-26 DIAGNOSIS — M79675 Pain in left toe(s): Secondary | ICD-10-CM

## 2019-01-26 DIAGNOSIS — M79674 Pain in right toe(s): Secondary | ICD-10-CM

## 2019-01-26 DIAGNOSIS — E0843 Diabetes mellitus due to underlying condition with diabetic autonomic (poly)neuropathy: Secondary | ICD-10-CM | POA: Diagnosis not present

## 2019-01-26 NOTE — Progress Notes (Signed)
   SUBJECTIVE Patient with a history of diabetes mellitus presents to office today complaining of elongated, thickened nails that cause pain while ambulating in shoes.  She is unable to trim her own nails. Patient is here for further evaluation and treatment.   Past Medical History:  Diagnosis Date  . Anemia 09/25/2016  . Bipolar affective disorder ([place]) 04/29/2012  . Borderline personality disorder ([place])   . Chronic back pain   . Depression   . Diabetes mellitus ([place]) 04/29/2012  . Gait difficulty   . GERD (gastroesophageal reflux disease)   . HTN (hypertension) 04/29/2012  . Hyperlipidemia   . Hypothyroidism   . Memory loss   . Schizoaffective disorder ([place]) 04/29/2012  . Schizophrenia ([place])     OBJECTIVE General Patient is awake, alert, and oriented x 3 and in no acute distress. Derm Skin is dry and supple bilateral. Negative open lesions or macerations. Remaining integument unremarkable. Nails are tender, long, thickened and dystrophic with subungual debris, consistent with onychomycosis, 1-5 bilateral. No signs of infection noted. Vasc  DP and PT pedal pulses palpable bilaterally. Temperature gradient within normal limits.  Neuro Epicritic and protective threshold sensation diminished bilaterally.  Musculoskeletal Exam No symptomatic pedal deformities noted bilateral. Muscular strength within normal limits.  ASSESSMENT 1. Diabetes Mellitus w/ peripheral neuropathy 2. Onychomycosis of nail due to dermatophyte bilateral 3. Pain in foot bilateral  PLAN OF CARE 1. Patient evaluated today. 2. Instructed to maintain good pedal hygiene and foot care. Stressed importance of controlling blood sugar.  3. Mechanical debridement of nails 1-5 bilaterally performed using a nail nipper. Filed with dremel without incident.  4. Return to clinic in 3 mos.     Edrick Kins, DPM [facility]  Dr. Edrick Kins, [place] [address]                                         [place], [place] [address]                Office [phone]  Fax [phone]

## 2019-03-24 ENCOUNTER — Other Ambulatory Visit: Payer: Self-pay | Admitting: Nurse Practitioner

## 2019-03-24 ENCOUNTER — Ambulatory Visit
Admission: RE | Admit: 2019-03-24 | Discharge: 2019-03-24 | Disposition: A | Discharge status: Home / Self Care | Payer: Medicare Other | Source: Ambulatory Visit | Attending: Nurse Practitioner | Admitting: Nurse Practitioner

## 2019-03-24 DIAGNOSIS — M25572 Pain in left ankle and joints of left foot: Secondary | ICD-10-CM

## 2019-03-24 DIAGNOSIS — M79672 Pain in left foot: Secondary | ICD-10-CM

## 2019-03-26 ENCOUNTER — Other Ambulatory Visit: Payer: Self-pay | Admitting: Neurology

## 2019-04-29 ENCOUNTER — Ambulatory Visit: Payer: Medicare Other | Admitting: Podiatry

## 2019-06-01 ENCOUNTER — Other Ambulatory Visit: Payer: Self-pay

## 2019-06-01 ENCOUNTER — Encounter: Payer: Self-pay | Admitting: Podiatry

## 2019-06-01 ENCOUNTER — Ambulatory Visit (INDEPENDENT_AMBULATORY_CARE_PROVIDER_SITE_OTHER): Payer: Medicare Other | Admitting: Podiatry

## 2019-06-01 DIAGNOSIS — B351 Tinea unguium: Secondary | ICD-10-CM

## 2019-06-01 DIAGNOSIS — E1142 Type 2 diabetes mellitus with diabetic polyneuropathy: Secondary | ICD-10-CM | POA: Diagnosis not present

## 2019-06-01 DIAGNOSIS — M79674 Pain in right toe(s): Secondary | ICD-10-CM | POA: Diagnosis not present

## 2019-06-01 DIAGNOSIS — M79675 Pain in left toe(s): Secondary | ICD-10-CM | POA: Diagnosis not present

## 2019-06-01 NOTE — Progress Notes (Signed)
Subjective:  [name] presents to clinic today with h/o diabetic neuropathy and  cc of  painful, thick, discolored, elongated toenails 1-5 b/l that become tender and cannot cut because of thickness. Pain is aggravated when wearing enclosed shoe gear and relieved with periodic professional debridement.  She voices no new problems on today's visit.  Leonard Downing, MD is her PCP. Last visit 11/12/2018.   Current Outpatient Medications:  .  ACCU-CHEK FASTCLIX LANCETS MISC, , Disp: , Rfl:  .  ACCU-CHEK GUIDE test strip, , Disp: , Rfl:  .  allopurinol (ZYLOPRIM) 100 MG tablet, Take 100 mg by mouth 2 (two) times daily., Disp: , Rfl:  .  atorvastatin (LIPITOR) 40 MG tablet, Take 40 mg by mouth daily at 6 PM., Disp: , Rfl:  .  benztropine (COGENTIN) 2 MG tablet, Take 2 mg by mouth 2 (two) times daily., Disp: , Rfl:  .  Blood Glucose Monitoring Suppl (ACCU-CHEK GUIDE) w/Device KIT, , Disp: , Rfl:  .  [employer] ANTACID PLUS ANTIGAS 400-400-40 MG/5ML suspension, TAKE 30 ML BY MOUTH EVERY 6 HOURS AS NEEDED FOR GAS PAIN, Disp: , Rfl:  .  divalproex (DEPAKOTE ER) 500 MG 24 hr tablet, Take 1,000 mg by mouth at bedtime. , Disp: , Rfl:  .  donepezil (ARICEPT) 10 MG tablet, Take 1 tablet (10 mg total) by mouth daily. Future refills my be managed by PCP., Disp: 30 tablet, Rfl: 5 .  escitalopram (LEXAPRO) 20 MG tablet, Take 20 mg by mouth every morning. , Disp: , Rfl:  .  famotidine (PEPCID) 20 MG tablet, , Disp: , Rfl:  .  famotidine (PEPCID) 40 MG tablet, , Disp: , Rfl:  .  ferrous gluconate (FERGON) 324 MG tablet, Take 1 tablet (324 mg total) by mouth 2 (two) times daily with a meal. (Patient taking differently: Take 324 mg by mouth daily with breakfast. ), Disp: 60 tablet, Rfl: 1 .  glipiZIDE (GLUCOTROL) 10 MG tablet, Take 10 mg by mouth 2 (two) times daily before a meal., Disp: , Rfl:  .  INVOKANA 100 MG TABS tablet, , Disp: , Rfl:  .  JANUVIA 100 MG tablet, , Disp: , Rfl:  .  latanoprost (XALATAN)  0.005 % ophthalmic solution, Place 1 drop into both eyes at bedtime., Disp: , Rfl:  .  levothyroxine (SYNTHROID, LEVOTHROID) 100 MCG tablet, , Disp: , Rfl:  .  levothyroxine (SYNTHROID, LEVOTHROID) 88 MCG tablet, Take 88 mcg by mouth daily before breakfast., Disp: , Rfl:  .  lisinopril (PRINIVIL,ZESTRIL) 10 MG tablet, Take 10 mg by mouth daily., Disp: , Rfl:  .  lisinopril (PRINIVIL,ZESTRIL) 5 MG tablet, , Disp: , Rfl:  .  metFORMIN (GLUCOPHAGE) 1000 MG tablet, Take 1,000 mg by mouth 2 (two) times daily with a meal., Disp: , Rfl:  .  ONGLYZA 5 MG TABS tablet, , Disp: , Rfl:  .  paliperidone (INVEGA SUSTENNA) 234 MG/1.5ML SUSP injection, Inject 234 mg into the muscle every 30 (thirty) days., Disp: , Rfl:  .  Paliperidone 1.5 MG TB24, , Disp: , Rfl:  .  polyethylene glycol (MIRALAX / GLYCOLAX) packet, Take 17 g by mouth daily., Disp: , Rfl:  .  prednisoLONE acetate (PRED FORTE) 1 % ophthalmic suspension, , Disp: , Rfl:  .  propranolol ER (INDERAL LA) 80 MG 24 hr capsule, , Disp: , Rfl:  .  ranitidine (ZANTAC) 300 MG tablet, , Disp: , Rfl:  .  traZODone (DESYREL) 100 MG tablet, Take 100 mg by  mouth at bedtime. , Disp: , Rfl:    Allergies  Allergen Reactions  . Codeine     agitation     Objective:  Physical Examination:  Vascular Examination: Capillary refill time immediate x 10 digits.  Palpable DP/PT pulses b/l.  Digital hair present b/l.  No edema noted b/l.  Skin temperature gradient WNL b/l.  Dermatological Examination: Skin with normal turgor, texture and tone b/l.  No open wounds b/l.  No interdigital macerations noted b/l.  Elongated, thick, discolored brittle toenails with subungual debris and pain on dorsal palpation of nailbeds 1-5 b/l.  Musculoskeletal Examination: Muscle strength 5/5 to all muscle groups b/l.  No pain, crepitus or joint discomfort with active/passive ROM.  Neurological Examination: Sensation diminished b/l with 10 gram  monofilament.  Assessment: Mycotic nail infection with pain 1-5 b/l NIDDM with neuropathy  Plan: 1. Toenails 1-5 b/l were debrided in length and girth without iatrogenic laceration. 2.  Continue soft, supportive shoe gear daily. 3.  Report any pedal injuries to medical professional. 4.  Follow up 3 months. 5.  Patient/POA to call should there be a question/concern in there interim.

## 2019-06-01 NOTE — Patient Instructions (Signed)

## 2019-07-01 ENCOUNTER — Ambulatory Visit: Payer: Medicare Other | Admitting: Podiatry

## 2019-08-24 ENCOUNTER — Other Ambulatory Visit: Payer: Self-pay | Admitting: Neurology

## 2019-09-02 ENCOUNTER — Other Ambulatory Visit: Payer: Self-pay

## 2019-09-02 ENCOUNTER — Ambulatory Visit (INDEPENDENT_AMBULATORY_CARE_PROVIDER_SITE_OTHER): Payer: Medicare Other | Admitting: Podiatry

## 2019-09-02 DIAGNOSIS — B351 Tinea unguium: Secondary | ICD-10-CM | POA: Diagnosis not present

## 2019-09-02 DIAGNOSIS — E119 Type 2 diabetes mellitus without complications: Secondary | ICD-10-CM | POA: Diagnosis not present

## 2019-09-02 DIAGNOSIS — M79674 Pain in right toe(s): Secondary | ICD-10-CM

## 2019-09-02 DIAGNOSIS — M79675 Pain in left toe(s): Secondary | ICD-10-CM | POA: Diagnosis not present

## 2019-09-02 NOTE — Patient Instructions (Signed)
Diabetes Mellitus and Foot Care Foot care is an important part of your health, especially when you have diabetes. Diabetes may cause you to have problems because of poor blood flow (circulation) to your feet and legs, which can cause your skin to:  Become thinner and drier.  Break more easily.  Heal more slowly.  Peel and crack. You may also have nerve damage (neuropathy) in your legs and feet, causing decreased feeling in them. This means that you may not notice minor injuries to your feet that could lead to more serious problems. Noticing and addressing any potential problems early is the best way to prevent future foot problems. How to care for your feet Foot hygiene  Wash your feet daily with warm water and mild soap. Do not use hot water. Then, pat your feet and the areas between your toes until they are completely dry. Do not soak your feet as this can dry your skin.  Trim your toenails straight across. Do not dig under them or around the cuticle. File the edges of your nails with an emery board or nail file.  Apply a moisturizing lotion or petroleum jelly to the skin on your feet and to dry, brittle toenails. Use lotion that does not contain alcohol and is unscented. Do not apply lotion between your toes. Shoes and socks  Wear clean socks or stockings every day. Make sure they are not too tight. Do not wear knee-high stockings since they may decrease blood flow to your legs.  Wear shoes that fit properly and have enough cushioning. Always look in your shoes before you put them on to be sure there are no objects inside.  To break in new shoes, wear them for just a few hours a day. This prevents injuries on your feet. Wounds, scrapes, corns, and calluses  Check your feet daily for blisters, cuts, bruises, sores, and redness. If you cannot see the bottom of your feet, use a mirror or ask someone for help.  Do not cut corns or calluses or try to remove them with medicine.  If you  find a minor scrape, cut, or break in the skin on your feet, keep it and the skin around it clean and dry. You may clean these areas with mild soap and water. Do not clean the area with peroxide, alcohol, or iodine.  If you have a wound, scrape, corn, or callus on your foot, look at it several times a day to make sure it is healing and not infected. Check for: ? Redness, swelling, or pain. ? Fluid or blood. ? Warmth. ? Pus or a bad smell. General instructions  Do not cross your legs. This may decrease blood flow to your feet.  Do not use heating pads or hot water bottles on your feet. They may burn your skin. If you have lost feeling in your feet or legs, you may not know this is happening until it is too late.  Protect your feet from hot and cold by wearing shoes, such as at the beach or on hot pavement.  Schedule a complete foot exam at least once a year (annually) or more often if you have foot problems. If you have foot problems, report any cuts, sores, or bruises to your health care provider immediately. Contact a health care provider if:  You have a medical condition that increases your risk of infection and you have any cuts, sores, or bruises on your feet.  You have an injury that is not   healing.  You have redness on your legs or feet.  You feel burning or tingling in your legs or feet.  You have pain or cramps in your legs and feet.  Your legs or feet are numb.  Your feet always feel cold.  You have pain around a toenail. Get help right away if:  You have a wound, scrape, corn, or callus on your foot and: ? You have pain, swelling, or redness that gets worse. ? You have fluid or blood coming from the wound, scrape, corn, or callus. ? Your wound, scrape, corn, or callus feels warm to the touch. ? You have pus or a bad smell coming from the wound, scrape, corn, or callus. ? You have a fever. ? You have a red line going up your leg. Summary  Check your feet every day  for cuts, sores, red spots, swelling, and blisters.  Moisturize feet and legs daily.  Wear shoes that fit properly and have enough cushioning.  If you have foot problems, report any cuts, sores, or bruises to your health care provider immediately.  Schedule a complete foot exam at least once a year (annually) or more often if you have foot problems. This information is not intended to replace advice given to you by your health care provider. Make sure you discuss any questions you have with your health care provider. Document Released: 08/31/2000 Document Revised: 10/16/2017 Document Reviewed: 10/05/2016 Elsevier Patient Education  2020 [employer]vier Inc.  

## 2019-09-05 ENCOUNTER — Encounter: Payer: Self-pay | Admitting: Podiatry

## 2019-09-05 NOTE — Progress Notes (Signed)
Subjective: [name] presents today for annual diabetic foot exam.  Patient denies any history of foot wounds.  Today, she is routinely seen for painful, discolored, thick toenails which interfere with daily activities.  Pain is aggravated when wearing enclosed shoe gear.   Past Medical History:  Diagnosis Date  . Anemia 09/25/2016  . Bipolar affective disorder ([place]) 04/29/2012  . Borderline personality disorder ([place])   . Chronic back pain   . Depression   . Diabetes mellitus ([place]) 04/29/2012  . Gait difficulty   . GERD (gastroesophageal reflux disease)   . HTN (hypertension) 04/29/2012  . Hyperlipidemia   . Hypothyroidism   . Memory loss   . Schizoaffective disorder ([place]) 04/29/2012  . Schizophrenia [facility])     Patient Active Problem List   Diagnosis Date Noted  . Gait abnormality 09/11/2018  . Memory loss 03/10/2018  . Abnormal CT of the head 03/10/2018  . Obesity (BMI 30-39.9) 11/06/2017  . Schizophrenia, paranoid type ([place]) 05/05/2012  . Anemia 04/30/2012  . Hyponatremia 04/29/2012  . Passive suicidal ideations 04/29/2012  . Suicidal ideations 04/29/2012  . HTN (hypertension) 04/29/2012  . Diabetes mellitus ([place]) 04/29/2012  . GERD (gastroesophageal reflux disease)   . Hyperlipidemia   . Chronic back pain   . Hypothyroidism     Past Surgical History:  Procedure Laterality Date  . APPENDECTOMY    . CHOLECYSTECTOMY    . COLONOSCOPY N/A 09/27/2016   Procedure: COLONOSCOPY;  Surgeon: Carol Ada, MD;  Location: [facility] ENDOSCOPY;  Service: Endoscopy;  Laterality: N/A;  . ESOPHAGOGASTRODUODENOSCOPY N/A 09/26/2016   Procedure: ESOPHAGOGASTRODUODENOSCOPY (EGD);  Surgeon: Juanita Craver, MD;  Location[facility] ENDOSCOPY;  Service: Endoscopy;  Laterality: N/A;  . GIVENS CAPSULE STUDY N/A 09/27/2016   Procedure: GIVENS CAPSULE STUDY;  Surgeon: Carol Ada, MD;  Location: [place];  Service: Endoscopy;  Laterality: N/A;  . KNEE SURGERY     right    Current Outpatient  Medications on File Prior to Visit  Medication Sig Dispense Refill  . ACCU-CHEK FASTCLIX LANCETS MISC     . ACCU-CHEK GUIDE test strip     . acetaminophen (TYLENOL) 325 MG tablet Take 650 mg by mouth 3 (three) times daily.    . ALLERGY RELIEF 10 MG tablet Take 10 mg by mouth daily.    Marland Kitchen allopurinol (ZYLOPRIM) 100 MG tablet Take 100 mg by mouth 2 (two) times daily.    Marland Kitchen atorvastatin (LIPITOR) 40 MG tablet Take 40 mg by mouth daily at 6 PM.    . benztropine (COGENTIN) 2 MG tablet Take 2 mg by mouth 2 (two) times daily.    . Blood Glucose Monitoring Suppl (ACCU-CHEK GUIDE) w/Device KIT     . [employer] ANTACID PLUS ANTIGAS 400-400-40 MG/5ML suspension TAKE 30 ML BY MOUTH EVERY 6 HOURS AS NEEDED FOR GAS PAIN    . divalproex (DEPAKOTE ER) 500 MG 24 hr tablet Take 1,000 mg by mouth at bedtime.     . donepezil (ARICEPT) 10 MG tablet Take 1 tablet (10 mg total) by mouth daily. Future refills my be managed by PCP. 30 tablet 5  . escitalopram (LEXAPRO) 20 MG tablet Take 20 mg by mouth every morning.     . famotidine (PEPCID) 20 MG tablet     . famotidine (PEPCID) 40 MG tablet     . ferrous gluconate (FERGON) 324 MG tablet Take 1 tablet (324 mg total) by mouth 2 (two) times daily with a meal. (Patient taking differently: Take 324 mg by mouth  daily with breakfast. ) 60 tablet 1  . glipiZIDE (GLUCOTROL) 10 MG tablet Take 10 mg by mouth 2 (two) times daily before a meal.    . INVOKANA 100 MG TABS tablet     . JANUVIA 100 MG tablet     . latanoprost (XALATAN) 0.005 % ophthalmic solution Place 1 drop into both eyes at bedtime.    Marland Kitchen levothyroxine (SYNTHROID, LEVOTHROID) 100 MCG tablet     . levothyroxine (SYNTHROID, LEVOTHROID) 88 MCG tablet Take 88 mcg by mouth daily before breakfast.    . lisinopril (PRINIVIL,ZESTRIL) 10 MG tablet Take 10 mg by mouth daily.    Marland Kitchen lisinopril (PRINIVIL,ZESTRIL) 5 MG tablet     . metFORMIN (GLUCOPHAGE) 1000 MG tablet Take 1,000 mg by mouth 2 (two) times daily with a meal.    .  moxifloxacin (VIGAMOX) 0.5 % ophthalmic solution INSTILL 1 DROP INTO LEFTOEYE FOUR TIMES DAILY STARTING 2 DAYS PRIOR TO SURGERY AND 2 DROPS MORNING OF SURGERY.    . ONGLYZA 5 MG TABS tablet     . paliperidone (INVEGA SUSTENNA) 234 MG/1.5ML SUSP injection Inject 234 mg into the muscle every 30 (thirty) days.    . Paliperidone 1.5 MG TB24     . polyethylene glycol (MIRALAX / GLYCOLAX) packet Take 17 g by mouth daily.    . prednisoLONE acetate (PRED FORTE) 1 % ophthalmic suspension     . propranolol ER (INDERAL LA) 80 MG 24 hr capsule     . ranitidine (ZANTAC) 300 MG tablet     . traZODone (DESYREL) 100 MG tablet Take 100 mg by mouth at bedtime.     . Vitamin D, Ergocalciferol, (DRISDOL) 1.25 MG (50000 UT) CAPS capsule Take 50,000 Units by mouth once a week.     No current facility-administered medications on file prior to visit.     Allergies  Allergen Reactions  . Codeine     agitation    Social History   Occupational History  . Occupation: disabled  Tobacco Use  . Smoking status: Former Smoker    Quit date: 2008    Years since quitting: 12.9  . Smokeless tobacco: Never Used  Substance and Sexual Activity  . Alcohol use: No    Comment: h/o use but none in years  . Drug use: No    Comment: remote h/o drug use - cocaine, marijuana, uppers, downers, qualudes  . Sexual activity: Never    Family History  Problem Relation Age of Onset  . CVA Mother 82  . Dementia Father 18  . CAD Maternal Grandmother   . Colon cancer Maternal Grandfather     Immunization History  Administered Date(s) Administered  . Influenza-Unspecified 05/18/2014   Review of systems: Positive Findings in bold print.  Constitutional:  chills, fatigue, fever, sweats, weight change Communication: Optometrist, sign Ecologist, hand writing, iPad/Android device Head: headaches, head injury Eyes: changes in vision, eye pain, glaucoma, cataracts, macular degeneration, diplopia, glare,  light  sensitivity, eyeglasses or contacts, blindness Ears nose mouth throat: hearing impaired, hearing aids,  ringing in ears, deaf, sign language,  vertigo, nosebleeds,  rhinitis,  cold sores, snoring, swollen glands Cardiovascular: HTN, edema, arrhythmia, pacemaker in place, defibrillator in place, chest pain/tightness, chronic anticoagulation, blood clot, heart failure, MI Peripheral Vascular: leg cramps, varicose veins, blood clots, lymphedema, varicosities Respiratory:  difficulty breathing, denies congestion, SOB, wheezing, cough, emphysema Gastrointestinal: change in appetite or weight, abdominal pain, constipation, diarrhea, nausea, vomiting, vomiting blood, change in bowel habits, abdominal pain, jaundice,  rectal bleeding, hemorrhoids, GERD Genitourinary:  nocturia,  pain on urination, polyuria,  blood in urine, Foley catheter, urinary urgency, ESRD on hemodialysis Musculoskeletal: amputation, cramping, stiff joints, painful joints, decreased joint motion, fractures, OA, gout, hemiplegia, paraplegia, uses cane, wheelchair bound, uses walker, uses rollator Skin: +changes in toenails, color change, dryness, itching, mole changes,  rash, wound(s) Neurological: headaches, numbness in feet, paresthesias in feet, burning in feet, fainting,  seizures, change in speech,  headaches, memory problems/poor historian, cerebral palsy, weakness, paralysis, CVA, TIA Endocrine: diabetes, hypothyroidism, hyperthyroidism,  goiter, dry mouth, flushing, heat intolerance,  cold intolerance,  excessive thirst, denies polyuria,  nocturia Hematological:  easy bleeding, excessive bleeding, easy bruising, enlarged lymph nodes, on long term blood thinner, history of past transusions Allergy/immunological:  hives, eczema, frequent infections, multiple drug allergies, seasonal allergies, transplant recipient, multiple food allergies Psychiatric:  anxiety, depression, mood disorder, suicidal ideations, hallucinations,  insomnia  Objective: There were no vitals filed for this visit. Vascular Examination: Capillary refill time immediate x 10 digits  Dorsalis pedis pulses palpable b/l.  Posterior tibial pulses palpable b/l.  Digital hair sparse b/l.   Skin temperature gradient WNL b/l.  Dermatological Examination: Skin thin, shiny and atrophic b/l.  Toenails 1-5 b/l discolored, thick, dystrophic with subungual debris and pain with palpation to nailbeds due to thickness of nails.  Musculoskeletal: Muscle strength 5/5 to all LE muscle groups b/l.  Pes planus b/l.   Neurological: Sensation intact 5/5 b/l with 10 gram monofilament.  Vibratory sensation intact b/l.   Last A1C: 6.8%  Assessment: 1. Painful onychomycosis toenails 1-5 b/l  2. NIDDM 3. Encounter for diabetic foot examination  Plan: 1. Annual foot examination performed today.  2. Discussed diabetic foot care principles. Literature dispensed on today. 3. Toenails 1-5 b/l were debrided in length and girth without iatrogenic bleeding. 4. Patient to continue soft, supportive shoe gear 5. Patient to report any pedal injuries to medical professional immediately. 6. Follow up 3 months.  7. Patient/POA to call should there be a concern in the interim.

## 2019-09-25 ENCOUNTER — Other Ambulatory Visit: Payer: Self-pay | Admitting: Neurology

## 2019-10-02 ENCOUNTER — Other Ambulatory Visit: Payer: Self-pay | Admitting: Family Medicine

## 2019-10-02 DIAGNOSIS — Z1231 Encounter for screening mammogram for malignant neoplasm of breast: Secondary | ICD-10-CM

## 2019-11-16 ENCOUNTER — Ambulatory Visit: Payer: Medicare Other

## 2019-12-01 ENCOUNTER — Other Ambulatory Visit: Payer: Self-pay

## 2019-12-01 ENCOUNTER — Encounter: Payer: Self-pay | Admitting: Podiatry

## 2019-12-01 ENCOUNTER — Ambulatory Visit (INDEPENDENT_AMBULATORY_CARE_PROVIDER_SITE_OTHER): Payer: Medicare Other | Admitting: Podiatry

## 2019-12-01 VITALS — Temp 97.0°F

## 2019-12-01 DIAGNOSIS — B351 Tinea unguium: Secondary | ICD-10-CM

## 2019-12-01 DIAGNOSIS — E119 Type 2 diabetes mellitus without complications: Secondary | ICD-10-CM

## 2019-12-01 DIAGNOSIS — M79674 Pain in right toe(s): Secondary | ICD-10-CM | POA: Diagnosis not present

## 2019-12-01 DIAGNOSIS — M79675 Pain in left toe(s): Secondary | ICD-10-CM | POA: Diagnosis not present

## 2019-12-01 NOTE — Patient Instructions (Signed)
Diabetes Mellitus and Foot Care Foot care is an important part of your health, especially when you have diabetes. Diabetes may cause you to have problems because of poor blood flow (circulation) to your feet and legs, which can cause your skin to:  Become thinner and drier.  Break more easily.  Heal more slowly.  Peel and crack. You may also have nerve damage (neuropathy) in your legs and feet, causing decreased feeling in them. This means that you may not notice minor injuries to your feet that could lead to more serious problems. Noticing and addressing any potential problems early is the best way to prevent future foot problems. How to care for your feet Foot hygiene  Wash your feet daily with warm water and mild soap. Do not use hot water. Then, pat your feet and the areas between your toes until they are completely dry. Do not soak your feet as this can dry your skin.  Trim your toenails straight across. Do not dig under them or around the cuticle. File the edges of your nails with an emery board or nail file.  Apply a moisturizing lotion or petroleum jelly to the skin on your feet and to dry, brittle toenails. Use lotion that does not contain alcohol and is unscented. Do not apply lotion between your toes. Shoes and socks  Wear clean socks or stockings every day. Make sure they are not too tight. Do not wear knee-high stockings since they may decrease blood flow to your legs.  Wear shoes that fit properly and have enough cushioning. Always look in your shoes before you put them on to be sure there are no objects inside.  To break in new shoes, wear them for just a few hours a day. This prevents injuries on your feet. Wounds, scrapes, corns, and calluses  Check your feet daily for blisters, cuts, bruises, sores, and redness. If you cannot see the bottom of your feet, use a mirror or ask someone for help.  Do not cut corns or calluses or try to remove them with medicine.  If you  find a minor scrape, cut, or break in the skin on your feet, keep it and the skin around it clean and dry. You may clean these areas with mild soap and water. Do not clean the area with peroxide, alcohol, or iodine.  If you have a wound, scrape, corn, or callus on your foot, look at it several times a day to make sure it is healing and not infected. Check for: ? Redness, swelling, or pain. ? Fluid or blood. ? Warmth. ? Pus or a bad smell. General instructions  Do not cross your legs. This may decrease blood flow to your feet.  Do not use heating pads or hot water bottles on your feet. They may burn your skin. If you have lost feeling in your feet or legs, you may not know this is happening until it is too late.  Protect your feet from hot and cold by wearing shoes, such as at the beach or on hot pavement.  Schedule a complete foot exam at least once a year (annually) or more often if you have foot problems. If you have foot problems, report any cuts, sores, or bruises to your health care provider immediately. Contact a health care provider if:  You have a medical condition that increases your risk of infection and you have any cuts, sores, or bruises on your feet.  You have an injury that is not   healing.  You have redness on your legs or feet.  You feel burning or tingling in your legs or feet.  You have pain or cramps in your legs and feet.  Your legs or feet are numb.  Your feet always feel cold.  You have pain around a toenail. Get help right away if:  You have a wound, scrape, corn, or callus on your foot and: ? You have pain, swelling, or redness that gets worse. ? You have fluid or blood coming from the wound, scrape, corn, or callus. ? Your wound, scrape, corn, or callus feels warm to the touch. ? You have pus or a bad smell coming from the wound, scrape, corn, or callus. ? You have a fever. ? You have a red line going up your leg. Summary  Check your feet every day  for cuts, sores, red spots, swelling, and blisters.  Moisturize feet and legs daily.  Wear shoes that fit properly and have enough cushioning.  If you have foot problems, report any cuts, sores, or bruises to your health care provider immediately.  Schedule a complete foot exam at least once a year (annually) or more often if you have foot problems. This information is not intended to replace advice given to you by your health care provider. Make sure you discuss any questions you have with your health care provider. Document Revised: 05/27/2019 Document Reviewed: 10/05/2016 Elsevier Patient Education  2020 [employer] Inc.  

## 2019-12-01 NOTE — Progress Notes (Signed)
Subjective: [name] presents today for follow up of preventative diabetic foot care and painful mycotic nails b/l that are difficult to trim. Pain interferes with ambulation. Aggravating factors include wearing enclosed shoe gear. Pain is relieved with periodic professional debridement.   She voices no new pedal concerns on today's visit.  Allergies  Allergen Reactions  . Codeine     agitation     Objective: Vitals:   12/01/19 1552  Temp: (!) 38 F (36.1 C)    Pt 64 y.o. year old Caucasian female  in NAD. AAO x 3.   Vascular Examination:  Capillary refill time to digits immediate b/l. Palpable DP pulses b/l. Palpable PT pulses b/l. Pedal hair sparse b/l. Skin temperature gradient within normal limits b/l.  Dermatological Examination: Pedal skin is thin shiny, atrophic bilaterally. No open wounds bilaterally. No interdigital macerations bilaterally. Toenails 1-5 b/l elongated, dystrophic, thickened, crumbly with subungual debris and tenderness to dorsal palpation.  Musculoskeletal: Normal muscle strength 5/5 to all lower extremity muscle groups bilaterally, no pain crepitus or joint limitation noted with ROM b/l, pes planus deformity noted and utilizes cane for ambulation assistance  Neurological: Protective sensation intact 5/5 intact bilaterally with 10g monofilament b/l Vibratory sensation intact b/l Proprioception intact bilaterally  Assessment: 1. Pain due to onychomycosis of toenails of both feet   2. Controlled type 2 diabetes mellitus without complication, without long-term current use of insulin ([place])    Plan: -Continue diabetic foot care principles. Literature dispensed on today.  -Toenails 1-5 b/l were debrided in length and girth with sterile nail nippers and dremel without iatrogenic bleeding.  -Patient to continue soft, supportive shoe gear daily. -Patient to report any pedal injuries to medical professional immediately. -Patient/POA to call should there  be question/concern in the interim.  Return in about 3 months (around 03/02/2020).

## 2020-01-08 ENCOUNTER — Ambulatory Visit
Admission: RE | Admit: 2020-01-08 | Discharge: 2020-01-08 | Disposition: A | Discharge status: Home / Self Care | Payer: Medicare Other | Source: Ambulatory Visit | Attending: Family Medicine | Admitting: Family Medicine

## 2020-01-08 ENCOUNTER — Other Ambulatory Visit: Payer: Self-pay

## 2020-01-08 DIAGNOSIS — Z1231 Encounter for screening mammogram for malignant neoplasm of breast: Secondary | ICD-10-CM

## 2020-03-08 ENCOUNTER — Ambulatory Visit: Payer: Medicare Other | Admitting: Podiatry

## 2020-03-11 ENCOUNTER — Encounter: Payer: Self-pay | Admitting: Podiatry

## 2020-03-11 ENCOUNTER — Ambulatory Visit (INDEPENDENT_AMBULATORY_CARE_PROVIDER_SITE_OTHER): Payer: Medicare Other | Admitting: Podiatry

## 2020-03-11 ENCOUNTER — Other Ambulatory Visit: Payer: Self-pay

## 2020-03-11 VITALS — Temp 97.0°F

## 2020-03-11 DIAGNOSIS — M79675 Pain in left toe(s): Secondary | ICD-10-CM | POA: Diagnosis not present

## 2020-03-11 DIAGNOSIS — E119 Type 2 diabetes mellitus without complications: Secondary | ICD-10-CM | POA: Diagnosis not present

## 2020-03-11 DIAGNOSIS — B351 Tinea unguium: Secondary | ICD-10-CM

## 2020-03-11 DIAGNOSIS — M79674 Pain in right toe(s): Secondary | ICD-10-CM | POA: Diagnosis not present

## 2020-03-11 NOTE — Patient Instructions (Signed)
Diabetes Mellitus and Foot Care Foot care is an important part of your health, especially when you have diabetes. Diabetes may cause you to have problems because of poor blood flow (circulation) to your feet and legs, which can cause your skin to:  Become thinner and drier.  Break more easily.  Heal more slowly.  Peel and crack. You may also have nerve damage (neuropathy) in your legs and feet, causing decreased feeling in them. This means that you may not notice minor injuries to your feet that could lead to more serious problems. Noticing and addressing any potential problems early is the best way to prevent future foot problems. How to care for your feet Foot hygiene  Wash your feet daily with warm water and mild soap. Do not use hot water. Then, pat your feet and the areas between your toes until they are completely dry. Do not soak your feet as this can dry your skin.  Trim your toenails straight across. Do not dig under them or around the cuticle. File the edges of your nails with an emery board or nail file.  Apply a moisturizing lotion or petroleum jelly to the skin on your feet and to dry, brittle toenails. Use lotion that does not contain alcohol and is unscented. Do not apply lotion between your toes. Shoes and socks  Wear clean socks or stockings every day. Make sure they are not too tight. Do not wear knee-high stockings since they may decrease blood flow to your legs.  Wear shoes that fit properly and have enough cushioning. Always look in your shoes before you put them on to be sure there are no objects inside.  To break in new shoes, wear them for just a few hours a day. This prevents injuries on your feet. Wounds, scrapes, corns, and calluses  Check your feet daily for blisters, cuts, bruises, sores, and redness. If you cannot see the bottom of your feet, use a mirror or ask someone for help.  Do not cut corns or calluses or try to remove them with medicine.  If you  find a minor scrape, cut, or break in the skin on your feet, keep it and the skin around it clean and dry. You may clean these areas with mild soap and water. Do not clean the area with peroxide, alcohol, or iodine.  If you have a wound, scrape, corn, or callus on your foot, look at it several times a day to make sure it is healing and not infected. Check for: ? Redness, swelling, or pain. ? Fluid or blood. ? Warmth. ? Pus or a bad smell. General instructions  Do not cross your legs. This may decrease blood flow to your feet.  Do not use heating pads or hot water bottles on your feet. They may burn your skin. If you have lost feeling in your feet or legs, you may not know this is happening until it is too late.  Protect your feet from hot and cold by wearing shoes, such as at the beach or on hot pavement.  Schedule a complete foot exam at least once a year (annually) or more often if you have foot problems. If you have foot problems, report any cuts, sores, or bruises to your health care provider immediately. Contact a health care provider if:  You have a medical condition that increases your risk of infection and you have any cuts, sores, or bruises on your feet.  You have an injury that is not   healing.  You have redness on your legs or feet.  You feel burning or tingling in your legs or feet.  You have pain or cramps in your legs and feet.  Your legs or feet are numb.  Your feet always feel cold.  You have pain around a toenail. Get help right away if:  You have a wound, scrape, corn, or callus on your foot and: ? You have pain, swelling, or redness that gets worse. ? You have fluid or blood coming from the wound, scrape, corn, or callus. ? Your wound, scrape, corn, or callus feels warm to the touch. ? You have pus or a bad smell coming from the wound, scrape, corn, or callus. ? You have a fever. ? You have a red line going up your leg. Summary  Check your feet every day  for cuts, sores, red spots, swelling, and blisters.  Moisturize feet and legs daily.  Wear shoes that fit properly and have enough cushioning.  If you have foot problems, report any cuts, sores, or bruises to your health care provider immediately.  Schedule a complete foot exam at least once a year (annually) or more often if you have foot problems. This information is not intended to replace advice given to you by your health care provider. Make sure you discuss any questions you have with your health care provider. Document Revised: 05/27/2019 Document Reviewed: 10/05/2016 Elsevier Patient Education  2020 [employer] Inc.  

## 2020-03-16 NOTE — Progress Notes (Signed)
Subjective: [name] is a 64 y.o. female patient seen today preventative diabetic foot care and painful mycotic nails b/l that are difficult to trim. Pain interferes with ambulation. Aggravating factors include wearing enclosed shoe gear. Pain is relieved with periodic professional debridement.  Past Medical History:  Diagnosis Date  . Anemia 09/25/2016  . Bipolar affective disorder ([place]) 04/29/2012  . Borderline personality disorder ([place])   . Chronic back pain   . Depression   . Diabetes mellitus ([place]) 04/29/2012  . Gait difficulty   . GERD (gastroesophageal reflux disease)   . HTN (hypertension) 04/29/2012  . Hyperlipidemia   . Hypothyroidism   . Memory loss   . Schizoaffective disorder ([place]) 04/29/2012  . Schizophrenia [facility])     Patient Active Problem List   Diagnosis Date Noted  . Gait abnormality 09/11/2018  . Memory loss 03/10/2018  . Abnormal CT of the head 03/10/2018  . Obesity (BMI 30-39.9) 11/06/2017  . Schizophrenia, paranoid type ([place]) 05/05/2012  . Anemia 04/30/2012  . Hyponatremia 04/29/2012  . Passive suicidal ideations 04/29/2012  . Suicidal ideations 04/29/2012  . HTN (hypertension) 04/29/2012  . Diabetes mellitus ([place]) 04/29/2012  . GERD (gastroesophageal reflux disease)   . Hyperlipidemia   . Chronic back pain   . Hypothyroidism     Current Outpatient Medications on File Prior to Visit  Medication Sig Dispense Refill  . ACCU-CHEK FASTCLIX LANCETS MISC     . ACCU-CHEK GUIDE test strip     . acetaminophen (TYLENOL) 325 MG tablet Take 650 mg by mouth 3 (three) times daily.    . ALLERGY RELIEF 10 MG tablet Take 10 mg by mouth daily.    Marland Kitchen allopurinol (ZYLOPRIM) 100 MG tablet Take 100 mg by mouth 2 (two) times daily.    Marland Kitchen atorvastatin (LIPITOR) 40 MG tablet Take 40 mg by mouth daily at 6 PM.    . benztropine (COGENTIN) 2 MG tablet Take 2 mg by mouth 2 (two) times daily.    . Blood Glucose Monitoring Suppl (ACCU-CHEK GUIDE) w/Device KIT     . [facility]  ANTACID PLUS ANTIGAS 400-400-40 MG/5ML suspension TAKE 30 ML BY MOUTH EVERY 6 HOURS AS NEEDED FOR GAS PAIN    . divalproex (DEPAKOTE ER) 500 MG 24 hr tablet Take 1,000 mg by mouth at bedtime.     . donepezil (ARICEPT) 10 MG tablet Take 1 tablet (10 mg total) by mouth daily. Future refills my be managed by PCP. 30 tablet 5  . escitalopram (LEXAPRO) 20 MG tablet Take 20 mg by mouth every morning.     . famotidine (PEPCID) 20 MG tablet     . FARXIGA 5 MG TABS tablet Take 5 mg by mouth daily.    . ferrous gluconate (FERGON) 324 MG tablet Take 1 tablet (324 mg total) by mouth 2 (two) times daily with a meal. (Patient taking differently: Take 324 mg by mouth daily with breakfast. ) 60 tablet 1  . GAVILAX 17 GM/SCOOP powder SMARTSIG:1 Capful(s) By Mouth Daily PRN    . glipiZIDE (GLUCOTROL) 10 MG tablet Take 10 mg by mouth 2 (two) times daily before a meal.    . INVOKANA 100 MG TABS tablet     . JANUVIA 100 MG tablet     . latanoprost (XALATAN) 0.005 % ophthalmic solution Place 1 drop into both eyes at bedtime.    Marland Kitchen levothyroxine (SYNTHROID, LEVOTHROID) 100 MCG tablet     . lisinopril (PRINIVIL,ZESTRIL) 5 MG tablet     .  metFORMIN (GLUCOPHAGE) 1000 MG tablet Take 1,000 mg by mouth 2 (two) times daily with a meal.    . moxifloxacin (VIGAMOX) 0.5 % ophthalmic solution INSTILL 1 DROP INTO LEFTOEYE FOUR TIMES DAILY STARTING 2 DAYS PRIOR TO SURGERY AND 2 DROPS MORNING OF SURGERY.    . ONGLYZA 5 MG TABS tablet     . paliperidone (INVEGA SUSTENNA) 234 MG/1.5ML SUSP injection Inject 234 mg into the muscle every 30 (thirty) days.    . Paliperidone 1.5 MG TB24     . prednisoLONE acetate (PRED FORTE) 1 % ophthalmic suspension     . propranolol ER (INDERAL LA) 80 MG 24 hr capsule     . traZODone (DESYREL) 100 MG tablet Take 100 mg by mouth at bedtime.     . Vitamin D, Ergocalciferol, (DRISDOL) 1.25 MG (50000 UT) CAPS capsule Take 50,000 Units by mouth once a week.     No current facility-administered medications  on file prior to visit.    Allergies  Allergen Reactions  . Codeine     agitation    Objective: Physical Exam  General: Patient is a pleasant 64 y.o. Caucasian female obese in NAD. AAO x 3.   Neurovascular Examination: Neurovascular status unchanged b/l lower extremities. Capillary refill time to digits immediate b/l. Palpable pedal pulses b/l LE. Pedal hair sparse. Lower extremity skin temperature gradient within normal limits.   Protective sensation intact 5/5 intact bilaterally with 10g monofilament b/l. Vibratory sensation intact b/l. Proprioception intact bilaterally. Babinski reflex negative b/l. Clonus negative b/l.  Dermatological:  Pedal skin with normal turgor, texture and tone bilaterally. No open wounds bilaterally. No interdigital macerations bilaterally. Toenails 1-5 b/l elongated, discolored, dystrophic, thickened, crumbly with subungual debris and tenderness to dorsal palpation.  Musculoskeletal:  Normal muscle strength 5/5 to all lower extremity muscle groups bilaterally. No pain crepitus or joint limitation noted with ROM b/l. No gross bony deformities bilaterally. Pes planus deformity noted b/l.  Utilizes cane for ambulation assistance.  Assessment and Plan:  1. Pain due to onychomycosis of toenails of both feet   2. Controlled type 2 diabetes mellitus without complication, without long-term current use of insulin ([place])    -Examined patient. -No new findings. No new orders. -Continue diabetic foot care principles. -Toenails 1-5 b/l were debrided in length and girth with sterile nail nippers and dremel without iatrogenic bleeding.  -Patient to report any pedal injuries to medical professional immediately. -Patient to continue soft, supportive shoe gear daily. -Patient/POA to call should there be question/concern in the interim.  Return in about 3 months (around 06/11/2020).  Marzetta Board, DPM

## 2020-05-27 ENCOUNTER — Ambulatory Visit (INDEPENDENT_AMBULATORY_CARE_PROVIDER_SITE_OTHER): Payer: Medicare Other

## 2020-05-27 ENCOUNTER — Other Ambulatory Visit: Payer: Self-pay

## 2020-05-27 ENCOUNTER — Encounter: Payer: Self-pay | Admitting: Pulmonary Disease

## 2020-05-27 ENCOUNTER — Ambulatory Visit (INDEPENDENT_AMBULATORY_CARE_PROVIDER_SITE_OTHER): Payer: Medicare Other | Admitting: Pulmonary Disease

## 2020-05-27 VITALS — BP 120/80 | HR 72 | Temp 97.8°F | Ht 62.0 in | Wt 173.4 lb

## 2020-05-27 DIAGNOSIS — R05 Cough: Secondary | ICD-10-CM

## 2020-05-27 DIAGNOSIS — R053 Chronic cough: Secondary | ICD-10-CM

## 2020-05-27 MED ORDER — LANSOPRAZOLE 15 MG PO CPDR: 15.0000 mg | DELAYED_RELEASE_CAPSULE | Freq: Every day | ORAL | 2 refills | Status: AC

## 2020-05-27 NOTE — Progress Notes (Signed)
[name]    [id number]    07-18-1956  Primary Care Physician:Elkins, Curt Jews, MD  Referring Physician: Ferd Hibbs, NP [place],  [place] [address]  Chief complaint:   Patient with a history of chronic cough about 7 years duration  HPI:  Chronic cough about 7 years duration Admits to sinus fullness and congestion Admits to allergies Admits to reflux symptoms  Denies wheezing This is a dry cough  Denies any recent acute illness  Quit smoking in 2008  No significant occupational predisposition to lung disease No family history of lung disease known to patient  No weight loss No hemoptysis Denies any chest pains or chest discomfort  Outpatient Encounter Medications as of 05/27/2020  Medication Sig  . ACCU-CHEK FASTCLIX LANCETS MISC   . ACCU-CHEK GUIDE test strip   . acetaminophen (TYLENOL) 325 MG tablet Take 650 mg by mouth 3 (three) times daily.  . ALLERGY RELIEF 10 MG tablet Take 10 mg by mouth daily.  Marland Kitchen allopurinol (ZYLOPRIM) 100 MG tablet Take 100 mg by mouth 2 (two) times daily.  Marland Kitchen atorvastatin (LIPITOR) 40 MG tablet Take 40 mg by mouth daily at 6 PM.  . Blood Glucose Monitoring Suppl (ACCU-CHEK GUIDE) w/Device KIT   . [facility] ANTACID PLUS ANTIGAS 400-400-40 MG/5ML suspension TAKE 30 ML BY MOUTH EVERY 6 HOURS AS NEEDED FOR GAS PAIN  . divalproex (DEPAKOTE ER) 500 MG 24 hr tablet Take 1,000 mg by mouth at bedtime.   . donepezil (ARICEPT) 10 MG tablet Take 1 tablet (10 mg total) by mouth daily. Future refills my be managed by PCP.  Marland Kitchen escitalopram (LEXAPRO) 20 MG tablet Take 20 mg by mouth every morning.   Marland Kitchen FARXIGA 5 MG TABS tablet Take 5 mg by mouth daily.  Marland Kitchen GAVILAX 17 GM/SCOOP powder SMARTSIG:1 Capful(s) By Mouth Daily PRN  . glipiZIDE (GLUCOTROL) 10 MG tablet Take 10 mg by mouth 2 (two) times daily before a meal.  . INVOKANA 100 MG TABS tablet   . latanoprost (XALATAN) 0.005 % ophthalmic solution Place 1 drop into both eyes  at bedtime.  Marland Kitchen levothyroxine (SYNTHROID, LEVOTHROID) 100 MCG tablet   . lisinopril (PRINIVIL,ZESTRIL) 5 MG tablet   . moxifloxacin (VIGAMOX) 0.5 % ophthalmic solution INSTILL 1 DROP INTO LEFTOEYE FOUR TIMES DAILY STARTING 2 DAYS PRIOR TO SURGERY AND 2 DROPS MORNING OF SURGERY.  . paliperidone (INVEGA SUSTENNA) 234 MG/1.5ML SUSP injection Inject 234 mg into the muscle every 30 (thirty) days. evry 2 -3 months  . traZODone (DESYREL) 100 MG tablet Take 100 mg by mouth at bedtime.   . Vitamin D, Ergocalciferol, (DRISDOL) 1.25 MG (50000 UT) CAPS capsule Take 50,000 Units by mouth once a week.  . [DISCONTINUED] benztropine (COGENTIN) 2 MG tablet Take 2 mg by mouth 2 (two) times daily.  . [DISCONTINUED] famotidine (PEPCID) 20 MG tablet   . [DISCONTINUED] ferrous gluconate (FERGON) 324 MG tablet Take 1 tablet (324 mg total) by mouth 2 (two) times daily with a meal. (Patient taking differently: Take 324 mg by mouth daily with breakfast. )  . [DISCONTINUED] JANUVIA 100 MG tablet   . [DISCONTINUED] metFORMIN (GLUCOPHAGE) 1000 MG tablet Take 1,000 mg by mouth 2 (two) times daily with a meal.  . [DISCONTINUED] ONGLYZA 5 MG TABS tablet   . [DISCONTINUED] Paliperidone 1.5 MG TB24   . [DISCONTINUED] prednisoLONE acetate (PRED FORTE) 1 % ophthalmic suspension   . [DISCONTINUED] propranolol ER (INDERAL LA) 80 MG  24 hr capsule    No facility-administered encounter medications on file as of 05/27/2020.    Allergies as of 05/27/2020 - Review Complete 05/27/2020  Allergen Reaction Noted  . Codeine  03/10/2018    Past Medical History:  Diagnosis Date  . Anemia 09/25/2016  . Bipolar affective disorder ([place]) 04/29/2012  . Borderline personality disorder ([place])   . Chronic back pain   . Depression   . Diabetes mellitus ([place]) 04/29/2012  . Gait difficulty   . GERD (gastroesophageal reflux disease)   . HTN (hypertension) 04/29/2012  . Hyperlipidemia   . Hypothyroidism   . Memory loss   . Schizoaffective  disorder ([place]) 04/29/2012  . Schizophrenia [facility])     Past Surgical History:  Procedure Laterality Date  . APPENDECTOMY    . CHOLECYSTECTOMY    . COLONOSCOPY N/A 09/27/2016   Procedure: COLONOSCOPY;  Surgeon: Carol Ada, MD;  Location: [facility] ENDOSCOPY;  Service: Endoscopy;  Laterality: N/A;  . ESOPHAGOGASTRODUODENOSCOPY N/A 09/26/2016   Procedure: ESOPHAGOGASTRODUODENOSCOPY (EGD);  Surgeon: Juanita Craver, MD;  Location: [facility] ENDOSCOPY;  Service: Endoscopy;  Laterality: N/A;  . GIVENS CAPSULE STUDY N/A 09/27/2016   Procedure: GIVENS CAPSULE STUDY;  Surgeon: Carol Ada, MD;  Location: [place];  Service: Endoscopy;  Laterality: N/A;  . KNEE SURGERY     right    Family History  Problem Relation Age of Onset  . CVA Mother 23  . Dementia Father 8  . CAD Maternal Grandmother   . Colon cancer Maternal Grandfather     Social History   Socioeconomic History  . Marital status: Single    Spouse name: Not on file  . Number of children: 0  . Years of education: one year college  . Highest education level: Not on file  Occupational History  . Occupation: disabled  Tobacco Use  . Smoking status: Former Smoker    Quit date: 2008    Years since quitting: 13.7  . Smokeless tobacco: Never Used  Vaping Use  . Vaping Use: Never used  Substance and Sexual Activity  . Alcohol use: No    Comment: h/o use but none in years  . Drug use: No    Comment: remote h/o drug use - cocaine, marijuana, uppers, downers, qualudes  . Sexual activity: Never  Other Topics Concern  . Not on file  Social History Narrative   L[place] in [facility] with five other ladies and staff.   Right-handed.   Occasional caffeine use.   Social Determinants of Health   Financial Resource Strain:   . Difficulty of Paying Living Expenses: Not on file  Food Insecurity:   . Worried About Charity fundraiser in the Last Year: Not on file  . Ran Out of Food in the Last Year: Not on file  Transportation  Needs:   . Lack of Transportation (Medical): Not on file  . Lack of Transportation (Non-Medical): Not on file  Physical Activity:   . Days of Exercise per Week: Not on file  . Minutes of Exercise per Session: Not on file  Stress:   . Feeling of Stress : Not on file  Social Connections:   . Frequency of Communication with Friends and Family: Not on file  . Frequency of Social Gatherings with Friends and Family: Not on file  . Attends Religious Services: Not on file  . Active Member of Clubs or Organizations: Not on file  . Attends Archivist Meetings: Not on file  .  Marital Status: Not on file  Intimate Partner Violence:   . Fear of Current or Ex-Partner: Not on file  . Emotionally Abused: Not on file  . Physically Abused: Not on file  . Sexually Abused: Not on file    Review of Systems  Constitutional: Negative.   HENT: Negative.   Respiratory: Positive for cough. Negative for wheezing.   All other systems reviewed and are negative.   Vitals:   05/27/20 1439  BP: 120/80  Pulse: 72  Temp: 97.8 F (36.6 C)  SpO2: 96%     Physical Exam Constitutional:      Appearance: She is obese.  HENT:     Head: Normocephalic.     Mouth/Throat:     Pharynx: No oropharyngeal exudate or posterior oropharyngeal erythema.  Cardiovascular:     Rate and Rhythm: Normal rate and regular rhythm.     Heart sounds: No murmur heard.  No friction rub.  Pulmonary:     Effort: Pulmonary effort is normal. No respiratory distress.     Breath sounds: Normal breath sounds. No stridor. No wheezing or rhonchi.  Neurological:     Mental Status: She is alert.  Psychiatric:        Mood and Affect: Mood normal.    Data Reviewed: Chest x-ray 05/27/2020 reviewed by myself showing prominent interstitial markings, no acute infiltrate  Assessment:  Chronic cough -Reason for cough may be multifactorial including secondary to reflux, secondary to rhinitis -Empiric treatment is appropriate at  present with nasal steroid and reflux medications  Plan/Recommendations: We will schedule patient for pulmonary function test  Chest x-ray ordered for this visit as reviewed above  If bronchodilators are needed then will initiate bronchodilators following reviewing pulmonary function tests  Follow-up appointment in 6 weeks Sherrilyn Rist MD [facility] 05/27/2020, 2:57 PM  CC: Ferd Hibbs, NP

## 2020-05-27 NOTE — Patient Instructions (Signed)
Chronic cough over 7 years  May be related to reflux May be related to allergies/sinus fullness  Prevacid 15 mg p.o. daily-prescription sent to pharmacy Flonase 2 sprays each nostril daily-this is over-the-counter  Obtain pulmonary function test-history of smoking Obtain chest x-ray  Follow-up in 6 weeks

## 2020-06-10 ENCOUNTER — Telehealth: Payer: Self-pay | Admitting: Pulmonary Disease

## 2020-06-10 MED ORDER — FLUTICASONE PROPIONATE 50 MCG/ACT NA SUSP: 2.0000 | Freq: Every day | NASAL | 11 refills | Status: AC

## 2020-06-10 NOTE — Telephone Encounter (Signed)
Pt advised to get Flonase so I have sent this into pharm  Nothing further needed

## 2020-06-20 ENCOUNTER — Encounter: Payer: Self-pay | Admitting: Podiatry

## 2020-06-20 ENCOUNTER — Ambulatory Visit (INDEPENDENT_AMBULATORY_CARE_PROVIDER_SITE_OTHER): Payer: Medicare Other | Admitting: Podiatry

## 2020-06-20 ENCOUNTER — Other Ambulatory Visit: Payer: Self-pay

## 2020-06-20 DIAGNOSIS — M79675 Pain in left toe(s): Secondary | ICD-10-CM

## 2020-06-20 DIAGNOSIS — M79674 Pain in right toe(s): Secondary | ICD-10-CM

## 2020-06-20 DIAGNOSIS — E119 Type 2 diabetes mellitus without complications: Secondary | ICD-10-CM | POA: Diagnosis not present

## 2020-06-20 DIAGNOSIS — B351 Tinea unguium: Secondary | ICD-10-CM | POA: Diagnosis not present

## 2020-06-23 NOTE — Progress Notes (Signed)
Subjective: [name] is a 64 y.o. female patient seen today preventative diabetic foot care and painful mycotic nails b/l that are difficult to trim. Pain interferes with ambulation. Aggravating factors include wearing enclosed shoe gear. Pain is relieved with periodic professional debridement.   She states her blood sugar was 101 mg/dl this morning.  She voices no new pedal problems on today's visit.  Past Medical History:  Diagnosis Date  . Anemia 09/25/2016  . Bipolar affective disorder ([place]) 04/29/2012  . Borderline personality disorder ([place])   . Chronic back pain   . Depression   . Diabetes mellitus ([place]) 04/29/2012  . Gait difficulty   . GERD (gastroesophageal reflux disease)   . HTN (hypertension) 04/29/2012  . Hyperlipidemia   . Hypothyroidism   . Memory loss   . Schizoaffective disorder ([place]) 04/29/2012  . Schizophrenia [facility])     Patient Active Problem List   Diagnosis Date Noted  . Gait abnormality 09/11/2018  . Memory loss 03/10/2018  . Abnormal CT of the head 03/10/2018  . Obesity (BMI 30-39.9) 11/06/2017  . Schizophrenia, paranoid type ([place]) 05/05/2012  . Anemia 04/30/2012  . Hyponatremia 04/29/2012  . Passive suicidal ideations 04/29/2012  . Suicidal ideations 04/29/2012  . HTN (hypertension) 04/29/2012  . Diabetes mellitus ([place]) 04/29/2012  . GERD (gastroesophageal reflux disease)   . Hyperlipidemia   . Chronic back pain   . Hypothyroidism     Current Outpatient Medications on File Prior to Visit  Medication Sig Dispense Refill  . ACCU-CHEK FASTCLIX LANCETS MISC     . ACCU-CHEK GUIDE test strip     . acetaminophen (TYLENOL) 325 MG tablet Take 650 mg by mouth 3 (three) times daily.    . ALLERGY RELIEF 10 MG tablet Take 10 mg by mouth daily.    Marland Kitchen allopurinol (ZYLOPRIM) 100 MG tablet Take 100 mg by mouth 2 (two) times daily.    Marland Kitchen atorvastatin (LIPITOR) 40 MG tablet Take 40 mg by mouth daily at 6 PM.    . Blood Glucose Monitoring Suppl (ACCU-CHEK  GUIDE) w/Device KIT     . [facility] ANTACID PLUS ANTIGAS 400-400-40 MG/5ML suspension TAKE 30 ML BY MOUTH EVERY 6 HOURS AS NEEDED FOR GAS PAIN    . divalproex (DEPAKOTE ER) 500 MG 24 hr tablet Take 1,000 mg by mouth at bedtime.     . donepezil (ARICEPT) 10 MG tablet Take 1 tablet (10 mg total) by mouth daily. Future refills my be managed by PCP. 30 tablet 5  . escitalopram (LEXAPRO) 20 MG tablet Take 20 mg by mouth every morning.     Marland Kitchen FARXIGA 5 MG TABS tablet Take 5 mg by mouth daily.    . fluticasone (FLONASE) 50 MCG/ACT nasal spray Place 2 sprays into both nostrils daily. 16 g 11  . GAVILAX 17 GM/SCOOP powder SMARTSIG:1 Capful(s) By Mouth Daily PRN    . glipiZIDE (GLUCOTROL) 10 MG tablet Take 10 mg by mouth 2 (two) times daily before a meal.    . INVOKANA 100 MG TABS tablet     . lansoprazole (PREVACID) 15 MG capsule Take 1 capsule (15 mg total) by mouth daily at 12 noon. 30 capsule 2  . latanoprost (XALATAN) 0.005 % ophthalmic solution Place 1 drop into both eyes at bedtime.    Marland Kitchen levothyroxine (SYNTHROID, LEVOTHROID) 100 MCG tablet     . lisinopril (PRINIVIL,ZESTRIL) 5 MG tablet     . moxifloxacin (VIGAMOX) 0.5 % ophthalmic solution INSTILL 1 DROP INTO LEFTOEYE FOUR TIMES  DAILY STARTING 2 DAYS PRIOR TO SURGERY AND 2 DROPS MORNING OF SURGERY.    . paliperidone (INVEGA SUSTENNA) 234 MG/1.5ML SUSP injection Inject 234 mg into the muscle every 30 (thirty) days. evry 2 -3 months    . traZODone (DESYREL) 100 MG tablet Take 100 mg by mouth at bedtime.     . Vitamin D, Ergocalciferol, (DRISDOL) 1.25 MG (50000 UT) CAPS capsule Take 50,000 Units by mouth once a week.     No current facility-administered medications on file prior to visit.    Allergies  Allergen Reactions  . Codeine     agitation    Objective: Physical Exam  General: Patient is a pleasant 64 y.o. Caucasian female obese in NAD. AAO x 3.   Neurovascular Examination: Neurovascular status unchanged b/l lower extremities. Capillary  refill time to digits immediate b/l. Palpable pedal pulses b/l LE. Pedal hair sparse. Lower extremity skin temperature gradient within normal limits.   Protective sensation intact 5/5 intact bilaterally with 10g monofilament b/l. Vibratory sensation intact b/l. Proprioception intact bilaterally. Babinski reflex negative b/l. Clonus negative b/l.  Dermatological:  Pedal skin with normal turgor, texture and tone bilaterally. No open wounds bilaterally. No interdigital macerations bilaterally. Toenails 1-5 b/l elongated, discolored, dystrophic, thickened, crumbly with subungual debris and tenderness to dorsal palpation.  Musculoskeletal:  Normal muscle strength 5/5 to all lower extremity muscle groups bilaterally. No pain crepitus or joint limitation noted with ROM b/l. No gross bony deformities bilaterally. Pes planus deformity noted b/l.  Utilizes cane for ambulation assistance.  Assessment and Plan:  1. Pain due to onychomycosis of toenails of both feet   2. Controlled type 2 diabetes mellitus without complication, without long-term current use of insulin ([place])    -Examined patient. -No new findings. No new orders. -Continue diabetic foot care principles. -Toenails 1-5 b/l were debrided in length and girth with sterile nail nippers and dremel without iatrogenic bleeding.  -Patient to report any pedal injuries to medical professional immediately. -Patient to continue soft, supportive shoe gear daily. -Patient/POA to call should there be question/concern in the interim.  Return in about 3 months (around 09/20/2020).  Marzetta Board, DPM

## 2020-07-04 ENCOUNTER — Ambulatory Visit (INDEPENDENT_AMBULATORY_CARE_PROVIDER_SITE_OTHER): Payer: Medicare Other | Admitting: Pulmonary Disease

## 2020-07-04 ENCOUNTER — Encounter: Payer: Self-pay | Admitting: Pulmonary Disease

## 2020-07-04 ENCOUNTER — Other Ambulatory Visit: Payer: Self-pay

## 2020-07-04 VITALS — BP 128/66 | HR 65 | Temp 97.6°F | Ht 62.0 in | Wt 204.0 lb

## 2020-07-04 DIAGNOSIS — R053 Chronic cough: Secondary | ICD-10-CM | POA: Diagnosis not present

## 2020-07-04 LAB — PULMONARY FUNCTION TEST
DL/VA % pred: 138 %
DL/VA: 5.9 ml/min/mmHg/L
DLCO cor % pred: 119 %
DLCO cor: 22.38 ml/min/mmHg
DLCO unc % pred: 119 %
DLCO unc: 22.38 ml/min/mmHg
FEF 25-75 Post: 3.44 L/sec
FEF 25-75 Pre: 3.46 L/sec
FEF2575-%Change-Post: 0 %
FEF2575-%Pred-Post: 160 %
FEF2575-%Pred-Pre: 161 %
FEV1-%Change-Post: 2 %
FEV1-%Pred-Post: 111 %
FEV1-%Pred-Pre: 108 %
FEV1-Post: 2.57 L
FEV1-Pre: 2.5 L
FEV1FVC-%Change-Post: 0 %
FEV1FVC-%Pred-Pre: 115 %
FEV6-%Change-Post: 2 %
FEV6-%Pred-Post: 98 %
FEV6-%Pred-Pre: 96 %
FEV6-Post: 2.85 L
FEV6-Pre: 2.79 L
FEV6FVC-%Pred-Post: 103 %
FEV6FVC-%Pred-Pre: 103 %
FVC-%Change-Post: 2 %
FVC-%Pred-Post: 94 %
FVC-%Pred-Pre: 92 %
FVC-Post: 2.85 L
FVC-Pre: 2.79 L
Post FEV1/FVC ratio: 90 %
Post FEV6/FVC ratio: 100 %
Pre FEV1/FVC ratio: 90 %
Pre FEV6/FVC Ratio: 100 %
RV % pred: 136 %
RV: 2.64 L
TLC % pred: 107 %
TLC: 5.09 L

## 2020-07-04 NOTE — Progress Notes (Signed)
Full PFT performed today. °

## 2020-07-04 NOTE — Patient Instructions (Signed)
Your breathing study is unrevealing  We will obtain CT scan of the chest without contrast  We will not start any inhalers at present  I will see you back in about 6 weeks  Call with any significant concerns

## 2020-07-04 NOTE — Progress Notes (Signed)
[name]    [id number]    01-Dec-1955  Primary Care Physician:Elkins, Curt Jews, MD  Referring Physician: Leonard Downing, MD [address] [place],  [place] [address]  Chief complaint:   Patient with a history of chronic cough about 7 years duration  HPI: No significant changes since last visit Still has a cough States it may not be as bad as what he was  Admits to sinus fullness and congestion, runs all the time Admits to allergies Admits to reflux symptoms  This is a dry cough, rare wheezing  Denies any recent acute illness  Quit smoking in 2008  No significant occupational predisposition to lung disease No family history of lung disease known to patient  No weight loss No hemoptysis Denies any chest pains or chest discomfort  Outpatient Encounter Medications as of 07/04/2020  Medication Sig  . ACCU-CHEK FASTCLIX LANCETS MISC   . ACCU-CHEK GUIDE test strip   . acetaminophen (TYLENOL) 325 MG tablet Take 650 mg by mouth 3 (three) times daily.  . ALLERGY RELIEF 10 MG tablet Take 10 mg by mouth daily.  Marland Kitchen allopurinol (ZYLOPRIM) 100 MG tablet Take 100 mg by mouth 2 (two) times daily.  Marland Kitchen atorvastatin (LIPITOR) 40 MG tablet Take 40 mg by mouth daily at 6 PM.  . Blood Glucose Monitoring Suppl (ACCU-CHEK GUIDE) w/Device KIT   . [facility] ANTACID PLUS ANTIGAS 400-400-40 MG/5ML suspension TAKE 30 ML BY MOUTH EVERY 6 HOURS AS NEEDED FOR GAS PAIN  . divalproex (DEPAKOTE ER) 500 MG 24 hr tablet Take 1,000 mg by mouth at bedtime.   . donepezil (ARICEPT) 10 MG tablet Take 1 tablet (10 mg total) by mouth daily. Future refills my be managed by PCP.  Marland Kitchen escitalopram (LEXAPRO) 20 MG tablet Take 20 mg by mouth every morning.   Marland Kitchen FARXIGA 5 MG TABS tablet Take 5 mg by mouth daily.  . fluticasone (FLONASE) 50 MCG/ACT nasal spray Place 2 sprays into both nostrils daily.  Marland Kitchen GAVILAX 17 GM/SCOOP powder SMARTSIG:1 Capful(s) By Mouth Daily PRN  . glipiZIDE (GLUCOTROL)  10 MG tablet Take 10 mg by mouth 2 (two) times daily before a meal.  . INVOKANA 100 MG TABS tablet   . JANUVIA 50 MG tablet Take 50 mg by mouth daily.  . lansoprazole (PREVACID) 15 MG capsule Take 1 capsule (15 mg total) by mouth daily at 12 noon.  Marland Kitchen LANTUS SOLOSTAR 100 UNIT/ML Solostar Pen Inject 10 Units into the skin at bedtime.  Marland Kitchen latanoprost (XALATAN) 0.005 % ophthalmic solution Place 1 drop into both eyes at bedtime.  Marland Kitchen levothyroxine (SYNTHROID, LEVOTHROID) 100 MCG tablet   . lisinopril (PRINIVIL,ZESTRIL) 5 MG tablet   . moxifloxacin (VIGAMOX) 0.5 % ophthalmic solution INSTILL 1 DROP INTO LEFTOEYE FOUR TIMES DAILY STARTING 2 DAYS PRIOR TO SURGERY AND 2 DROPS MORNING OF SURGERY.  . paliperidone (INVEGA SUSTENNA) 234 MG/1.5ML SUSP injection Inject 234 mg into the muscle every 30 (thirty) days. evry 2 -3 months  . pantoprazole (PROTONIX) 20 MG tablet Take 20 mg by mouth daily.  . predniSONE (DELTASONE) 20 MG tablet Take by mouth.  . traZODone (DESYREL) 100 MG tablet Take 100 mg by mouth at bedtime.   . Vitamin D, Ergocalciferol, (DRISDOL) 1.25 MG (50000 UT) CAPS capsule Take 50,000 Units by mouth once a week.   No facility-administered encounter medications on file as of 07/04/2020.    Allergies as of 07/04/2020 - Review Complete 07/04/2020  Allergen Reaction Noted  . Codeine  03/10/2018    Past Medical History:  Diagnosis Date  . Anemia 09/25/2016  . Bipolar affective disorder ([place]) 04/29/2012  . Borderline personality disorder ([place])   . Chronic back pain   . Depression   . Diabetes mellitus ([place]) 04/29/2012  . Gait difficulty   . GERD (gastroesophageal reflux disease)   . HTN (hypertension) 04/29/2012  . Hyperlipidemia   . Hypothyroidism   . Memory loss   . Schizoaffective disorder ([place]) 04/29/2012  . Schizophrenia [facility])     Past Surgical History:  Procedure Laterality Date  . APPENDECTOMY    . CHOLECYSTECTOMY    . COLONOSCOPY N/A 09/27/2016   Procedure: COLONOSCOPY;   Surgeon: Carol Ada, MD;  Location: [facility] ENDOSCOPY;  Service: Endoscopy;  Laterality: N/A;  . ESOPHAGOGASTRODUODENOSCOPY N/A 09/26/2016   Procedure: ESOPHAGOGASTRODUODENOSCOPY (EGD);  Surgeon: Juanita Craver, MD;  Location: [facility] ENDOSCOPY;  Service: Endoscopy;  Laterality: N/A;  . GIVENS CAPSULE STUDY N/A 09/27/2016   Procedure: GIVENS CAPSULE STUDY;  Surgeon: Carol Ada, MD;  Location: [place];  Service: Endoscopy;  Laterality: N/A;  . KNEE SURGERY     right    Family History  Problem Relation Age of Onset  . CVA Mother 58  . Dementia Father 5  . CAD Maternal Grandmother   . Colon cancer Maternal Grandfather     Social History   Socioeconomic History  . Marital status: Single    Spouse name: Not on file  . Number of children: 0  . Years of education: one year college  . Highest education level: Not on file  Occupational History  . Occupation: disabled  Tobacco Use  . Smoking status: Former Smoker    Quit date: 2008    Years since quitting: 13.8  . Smokeless tobacco: Never Used  Vaping Use  . Vaping Use: Never used  Substance and Sexual Activity  . Alcohol use: No    Comment: h/o use but none in years  . Drug use: No    Comment: remote h/o drug use - cocaine, marijuana, uppers, downers, qualudes  . Sexual activity: Never  Other Topics Concern  . Not on file  Social History Narrative   Lives in [facility] with five other ladies and staff.   Right-handed.   Occasional caffeine use.   Social Determinants of Health   Financial Resource Strain:   . Difficulty of Paying Living Expenses: Not on file  Food Insecurity:   . Worried About Charity fundraiser in the Last Year: Not on file  . Ran Out of Food in the Last Year: Not on file  Transportation Needs:   . Lack of Transportation (Medical): Not on file  . Lack of Transportation (Non-Medical): Not on file  Physical Activity:   . Days of Exercise per Week: Not on file  . Minutes of Exercise per  Session: Not on file  Stress:   . Feeling of Stress : Not on file  Social Connections:   . Frequency of Communication with Friends and Family: Not on file  . Frequency of Social Gatherings with Friends and Family: Not on file  . Attends Religious Services: Not on file  . Active Member of Clubs or Organizations: Not on file  . Attends Archivist Meetings: Not on file  . Marital Status: Not on file  Intimate Partner Violence:   . Fear of Current or Ex-Partner: Not on file  . Emotionally Abused: Not on file  .  Physically Abused: Not on file  . Sexually Abused: Not on file    Review of Systems  Constitutional: Negative.   HENT: Negative.   Respiratory: Positive for cough. Negative for wheezing.   All other systems reviewed and are negative.   Vitals:   07/04/20 1500  BP: 128/66  Pulse: 65  SpO2: 98%     Physical Exam Constitutional:      Appearance: She is obese.  HENT:     Head: Normocephalic.     Mouth/Throat:     Pharynx: No oropharyngeal exudate or posterior oropharyngeal erythema.  Cardiovascular:     Rate and Rhythm: Normal rate and regular rhythm.     Heart sounds: No murmur heard.  No friction rub.  Pulmonary:     Effort: Pulmonary effort is normal. No respiratory distress.     Breath sounds: Normal breath sounds. No stridor. No wheezing or rhonchi.  Neurological:     Mental Status: She is alert.  Psychiatric:        Mood and Affect: Mood normal.    Data Reviewed: Chest x-ray 05/27/2020 reviewed by myself showing prominent interstitial markings, no acute infiltrate Most recent chest x-ray 05/27/2020 with prominent interstitial markings PFT reviewed showing no obstruction, no restriction, normal diffusing capacity  Assessment:  Chronic cough -Likely multifactorial -Continue empiric treatment with nasal steroid, reflux medications  With persistence of symptoms and prominent interstitial markings -Will order CT scan of the  chest  Plan/Recommendations: CT scan of the chest without contrast to follow-up on interstitial process  Graded exercise as tolerated  Follow-up appointment in 6 weeks  Sherrilyn Rist MD [facility] 07/04/2020, 3:04 PM  CC: Leonard Downing, *

## 2020-07-14 ENCOUNTER — Other Ambulatory Visit: Payer: Medicare Other

## 2020-08-15 ENCOUNTER — Ambulatory Visit: Payer: Medicare Other | Admitting: Pulmonary Disease

## 2020-08-18 ENCOUNTER — Ambulatory Visit
Admission: RE | Admit: 2020-08-18 | Discharge: 2020-08-18 | Disposition: A | Discharge status: Home / Self Care | Payer: Medicare Other | Source: Ambulatory Visit | Attending: Pulmonary Disease | Admitting: Pulmonary Disease

## 2020-08-18 DIAGNOSIS — R053 Chronic cough: Secondary | ICD-10-CM

## 2020-08-23 ENCOUNTER — Encounter: Payer: Self-pay | Admitting: Pulmonary Disease

## 2020-08-23 ENCOUNTER — Other Ambulatory Visit: Payer: Self-pay

## 2020-08-23 ENCOUNTER — Ambulatory Visit (INDEPENDENT_AMBULATORY_CARE_PROVIDER_SITE_OTHER): Payer: Medicare Other | Admitting: Pulmonary Disease

## 2020-08-23 VITALS — BP 126/70 | HR 65 | Temp 97.8°F | Ht 65.0 in | Wt 201.6 lb

## 2020-08-23 DIAGNOSIS — R059 Cough, unspecified: Secondary | ICD-10-CM

## 2020-08-23 NOTE — Patient Instructions (Signed)
Chronic cough likely related to reflux  Continue Prevacid or Protonix for reflux  Elevate head of bed at bedtime  Avoid eating too late into the evening  Follow-up in 6 months

## 2020-08-23 NOTE — Progress Notes (Signed)
[name]    [id number]    Dec 17, 1955  Primary Care Physician:Elkins, Curt Jews, MD  Referring Physician: Leonard Downing, MD [address] [place],  [place] [address]  Chief complaint:   Patient with chronic cough  HPI: Still has a cough  Allergy symptoms are better  Dry cough, no wheezing Denies significant symptoms of reflux  Denies any recent acute illness  Quit smoking in 2008  No significant occupational predisposition to lung disease No family history of lung disease known to patient  No weight loss No hemoptysis Denies any chest pains or chest discomfort  Outpatient Encounter Medications as of 08/23/2020  Medication Sig  . ACCU-CHEK FASTCLIX LANCETS MISC   . ACCU-CHEK GUIDE test strip   . acetaminophen (TYLENOL) 325 MG tablet Take 650 mg by mouth 3 (three) times daily.  . ALLERGY RELIEF 10 MG tablet Take 10 mg by mouth daily.  Marland Kitchen allopurinol (ZYLOPRIM) 100 MG tablet Take 100 mg by mouth 2 (two) times daily.  Marland Kitchen atorvastatin (LIPITOR) 40 MG tablet Take 40 mg by mouth daily at 6 PM.  . Blood Glucose Monitoring Suppl (ACCU-CHEK GUIDE) w/Device KIT   . [facility] ANTACID PLUS ANTIGAS 400-400-40 MG/5ML suspension TAKE 30 ML BY MOUTH EVERY 6 HOURS AS NEEDED FOR GAS PAIN  . divalproex (DEPAKOTE ER) 500 MG 24 hr tablet Take 1,000 mg by mouth at bedtime.   . donepezil (ARICEPT) 10 MG tablet Take 1 tablet (10 mg total) by mouth daily. Future refills my be managed by PCP.  Marland Kitchen escitalopram (LEXAPRO) 20 MG tablet Take 20 mg by mouth every morning.   Marland Kitchen FARXIGA 5 MG TABS tablet Take 5 mg by mouth daily.  . fluticasone (FLONASE) 50 MCG/ACT nasal spray Place 2 sprays into both nostrils daily.  Marland Kitchen GAVILAX 17 GM/SCOOP powder SMARTSIG:1 Capful(s) By Mouth Daily PRN  . glipiZIDE (GLUCOTROL) 10 MG tablet Take 10 mg by mouth 2 (two) times daily before a meal.  . INVOKANA 100 MG TABS tablet   . JANUVIA 50 MG tablet Take 50 mg by mouth daily.  . lansoprazole  (PREVACID) 15 MG capsule Take 1 capsule (15 mg total) by mouth daily at 12 noon.  Marland Kitchen LANTUS SOLOSTAR 100 UNIT/ML Solostar Pen Inject 10 Units into the skin at bedtime.  Marland Kitchen latanoprost (XALATAN) 0.005 % ophthalmic solution Place 1 drop into both eyes at bedtime.  Marland Kitchen levothyroxine (SYNTHROID, LEVOTHROID) 100 MCG tablet   . lisinopril (PRINIVIL,ZESTRIL) 5 MG tablet   . moxifloxacin (VIGAMOX) 0.5 % ophthalmic solution INSTILL 1 DROP INTO LEFTOEYE FOUR TIMES DAILY STARTING 2 DAYS PRIOR TO SURGERY AND 2 DROPS MORNING OF SURGERY.  . paliperidone (INVEGA SUSTENNA) 234 MG/1.5ML SUSP injection Inject 234 mg into the muscle every 30 (thirty) days. evry 2 -3 months  . pantoprazole (PROTONIX) 20 MG tablet Take 20 mg by mouth daily.  . traZODone (DESYREL) 100 MG tablet Take 100 mg by mouth at bedtime.   . Vitamin D, Ergocalciferol, (DRISDOL) 1.25 MG (50000 UT) CAPS capsule Take 50,000 Units by mouth once a week.  . predniSONE (DELTASONE) 20 MG tablet Take by mouth. (Patient not taking: Reported on 08/23/2020)   No facility-administered encounter medications on file as of 08/23/2020.    Allergies as of 08/23/2020 - Review Complete 08/23/2020  Allergen Reaction Noted  . Codeine  03/10/2018    Past Medical History:  Diagnosis Date  . Anemia 09/25/2016  . Bipolar affective disorder ([place]) 04/29/2012  .  Borderline personality disorder ([place])   . Chronic back pain   . Depression   . Diabetes mellitus ([place]) 04/29/2012  . Gait difficulty   . GERD (gastroesophageal reflux disease)   . HTN (hypertension) 04/29/2012  . Hyperlipidemia   . Hypothyroidism   . Memory loss   . Schizoaffective disorder ([place]) 04/29/2012  . Schizophrenia [facility])     Past Surgical History:  Procedure Laterality Date  . APPENDECTOMY    . CHOLECYSTECTOMY    . COLONOSCOPY N/A 09/27/2016   Procedure: COLONOSCOPY;  Surgeon: Carol Ada, MD;  Location[facility] ENDOSCOPY;  Service: Endoscopy;  Laterality: N/A;  . ESOPHAGOGASTRODUODENOSCOPY N/A  09/26/2016   Procedure: ESOPHAGOGASTRODUODENOSCOPY (EGD);  Surgeon: Juanita Craver, MD;  Location[facility] ENDOSCOPY;  Service: Endoscopy;  Laterality: N/A;  . GIVENS CAPSULE STUDY N/A 09/27/2016   Procedure: GIVENS CAPSULE STUDY;  Surgeon: Carol Ada, MD;  Location: [place];  Service: Endoscopy;  Laterality: N/A;  . KNEE SURGERY     right    Family History  Problem Relation Age of Onset  . CVA Mother 69  . Dementia Father 49  . CAD Maternal Grandmother   . Colon cancer Maternal Grandfather     Social History   Socioeconomic History  . Marital status: Single    Spouse name: Not on file  . Number of children: 0  . Years of education: one year college  . Highest education level: Not on file  Occupational History  . Occupation: disabled  Tobacco Use  . Smoking status: Former Smoker    Quit date: 2008    Years since quitting: 13.9  . Smokeless tobacco: Never Used  Vaping Use  . Vaping Use: Never used  Substance and Sexual Activity  . Alcohol use: No    Comment: h/o use but none in years  . Drug use: No    Comment: remote h/o drug use - cocaine, marijuana, uppers, downers, qualudes  . Sexual activity: Never  Other Topics Concern  . Not on file  Social History Narrative   Lives in [facility] with five other ladies and staff.   Right-handed.   Occasional caffeine use.   Social Determinants of Health   Financial Resource Strain:   . Difficulty of Paying Living Expenses: Not on file  Food Insecurity:   . Worried About Charity fundraiser in the Last Year: Not on file  . Ran Out of Food in the Last Year: Not on file  Transportation Needs:   . Lack of Transportation (Medical): Not on file  . Lack of Transportation (Non-Medical): Not on file  Physical Activity:   . Days of Exercise per Week: Not on file  . Minutes of Exercise per Session: Not on file  Stress:   . Feeling of Stress : Not on file  Social Connections:   . Frequency of Communication with  Friends and Family: Not on file  . Frequency of Social Gatherings with Friends and Family: Not on file  . Attends Religious Services: Not on file  . Active Member of Clubs or Organizations: Not on file  . Attends Archivist Meetings: Not on file  . Marital Status: Not on file  Intimate Partner Violence:   . Fear of Current or Ex-Partner: Not on file  . Emotionally Abused: Not on file  . Physically Abused: Not on file  . Sexually Abused: Not on file    Review of Systems  Constitutional: Negative.   HENT: Negative.   Respiratory:  Positive for cough. Negative for wheezing.   All other systems reviewed and are negative.   Vitals:   08/23/20 1517  BP: 126/70  Pulse: 65  Temp: 97.8 F (36.6 C)  SpO2: 98%     Physical Exam Constitutional:      Appearance: She is obese.  HENT:     Mouth/Throat:     Pharynx: No oropharyngeal exudate or posterior oropharyngeal erythema.  Cardiovascular:     Rate and Rhythm: Normal rate and regular rhythm.     Heart sounds: No murmur heard.  No friction rub.  Pulmonary:     Effort: Pulmonary effort is normal. No respiratory distress.     Breath sounds: Normal breath sounds. No stridor. No wheezing or rhonchi.  Neurological:     Mental Status: She is alert.  Psychiatric:        Mood and Affect: Mood normal.    Data Reviewed: Chest x-ray 05/27/2020 reviewed by myself showing prominent interstitial markings, no acute infiltrate Most recent chest x-ray 05/27/2020 with prominent interstitial markings PFT reviewed showing no obstruction, no restriction, normal diffusing capacity   CT chest reviewed by myself showing large hiatal hernia  Assessment:  Chronic cough -Likely multifactorial -Most likely related to reflux  -CT chest did not reveal significant infiltrative process or increased interstitial markings  Best strategy is antireflux medications Behavioral modifications  Plan/Recommendations:  Continue Protonix or  Prevacid for reflux  Elevate head of bed at bedtime  Avoid eating late in the evenings  Follow-up in 6 months  Sherrilyn Rist MD [facility] 08/23/2020, 3:22 PM  CC: Leonard Downing, *

## 2020-09-26 ENCOUNTER — Ambulatory Visit (INDEPENDENT_AMBULATORY_CARE_PROVIDER_SITE_OTHER): Payer: Medicare Other | Admitting: Podiatry

## 2020-09-26 ENCOUNTER — Other Ambulatory Visit: Payer: Self-pay

## 2020-09-26 ENCOUNTER — Encounter: Payer: Self-pay | Admitting: Podiatry

## 2020-09-26 DIAGNOSIS — M79674 Pain in right toe(s): Secondary | ICD-10-CM | POA: Diagnosis not present

## 2020-09-26 DIAGNOSIS — B351 Tinea unguium: Secondary | ICD-10-CM | POA: Diagnosis not present

## 2020-09-26 DIAGNOSIS — M79675 Pain in left toe(s): Secondary | ICD-10-CM

## 2020-09-26 DIAGNOSIS — E119 Type 2 diabetes mellitus without complications: Secondary | ICD-10-CM

## 2020-09-27 NOTE — Progress Notes (Signed)
Subjective:   Patient ID: [name], female   DOB: 65 y.o.   MRN: [id number]   HPI Patient was found to have thickened yellowed brittle nailbeds 1-5 both feet that are painful and are making it hard for her to walk. She cannot trim them herself reach them or cut them   ROS      Objective:  Physical Exam  Neurovascular status intact with thick yellow brittle nailbeds 1-5 both feet that she cannot take care of and are painful when pressed distal and dorsal     Assessment:  Chronic mycotic nail infection 1-5 bilateral     Plan:  Debridement nailbeds 1-5 bilateral no iatrogenic bleeding reappoint routine care

## 2020-11-30 ENCOUNTER — Other Ambulatory Visit: Payer: Self-pay | Admitting: Family Medicine

## 2020-11-30 DIAGNOSIS — Z1231 Encounter for screening mammogram for malignant neoplasm of breast: Secondary | ICD-10-CM

## 2021-01-16 ENCOUNTER — Encounter: Payer: Self-pay | Admitting: Podiatry

## 2021-01-16 ENCOUNTER — Ambulatory Visit (INDEPENDENT_AMBULATORY_CARE_PROVIDER_SITE_OTHER): Payer: Medicare Other | Admitting: Podiatry

## 2021-01-16 ENCOUNTER — Other Ambulatory Visit: Payer: Self-pay

## 2021-01-16 DIAGNOSIS — D509 Iron deficiency anemia, unspecified: Secondary | ICD-10-CM | POA: Insufficient documentation

## 2021-01-16 DIAGNOSIS — M79675 Pain in left toe(s): Secondary | ICD-10-CM | POA: Diagnosis not present

## 2021-01-16 DIAGNOSIS — M79674 Pain in right toe(s): Secondary | ICD-10-CM | POA: Diagnosis not present

## 2021-01-16 DIAGNOSIS — B351 Tinea unguium: Secondary | ICD-10-CM | POA: Diagnosis not present

## 2021-01-16 DIAGNOSIS — R197 Diarrhea, unspecified: Secondary | ICD-10-CM | POA: Insufficient documentation

## 2021-01-16 DIAGNOSIS — E119 Type 2 diabetes mellitus without complications: Secondary | ICD-10-CM | POA: Diagnosis not present

## 2021-01-16 DIAGNOSIS — K31819 Angiodysplasia of stomach and duodenum without bleeding: Secondary | ICD-10-CM | POA: Insufficient documentation

## 2021-01-19 NOTE — Progress Notes (Signed)
  Subjective:  Patient ID: [name], female    DOB: 02-14-1956,  MRN: [id number]  65 y.o. female presents with preventative diabetic foot care and painful thick toenails that are difficult to trim. Pain interferes with ambulation. Aggravating factors include wearing enclosed shoe gear. Pain is relieved with periodic professional debridement.   She voices no new pedal concerns on today's visit.  Patient's blood sugar was 138 mg/dl this morning  PCP: Leonard Downing, MD. Last visit was 2-3 weeks ago.  Review of Systems: Negative except as noted in the HPI.   Allergies  Allergen Reactions  . Codeine     agitation    Objective:  There were no vitals filed for this visit. Constitutional Patient is a pleasant 65 y.o. Caucasian female in NAD. AAO x 3.  Vascular Capillary refill time to digits immediate b/l. Palpable pedal pulses b/l LE. Pedal hair sparse. Lower extremity skin temperature gradient within normal limits. No cyanosis or clubbing noted.  Neurologic Normal speech. Protective sensation intact 5/5 intact bilaterally with 10g monofilament b/l.  Dermatologic Pedal skin with normal turgor, texture and tone bilaterally. No open wounds bilaterally. No interdigital macerations bilaterally. Toenails 1-5 b/l elongated, discolored, dystrophic, thickened, crumbly with subungual debris and tenderness to dorsal palpation.  Orthopedic: Normal muscle strength 5/5 to all lower extremity muscle groups bilaterally. No pain crepitus or joint limitation noted with ROM b/l. Pes planus deformity noted b/l.  Utilizes cane for ambulation assistance.   Assessment:   1. Pain due to onychomycosis of toenails of both feet   2. Controlled type 2 diabetes mellitus without complication, without long-term current use of insulin ([place])    Plan:  Patient was evaluated and treated and all questions answered.  Onychomycosis with pain -Nails palliatively debridement as below. -Educated on  self-care  Procedure: Nail Debridement Rationale: Pain Type of Debridement: manual, sharp debridement. Instrumentation: Nail nipper, rotary burr. Number of Nails: 10  -Examined patient. -Continue diabetic foot care principles. -Patient to continue soft, supportive shoe gear daily. -Toenails 1-5 b/l were debrided in length and girth with sterile nail nippers and dremel without iatrogenic bleeding.  -Patient to report any pedal injuries to medical professional immediately. -Patient/POA to call should there be question/concern in the interim.  Return in about 3 months (around 04/18/2021).  Marzetta Board, DPM

## 2021-01-24 ENCOUNTER — Ambulatory Visit: Payer: Medicare Other

## 2021-01-25 ENCOUNTER — Other Ambulatory Visit: Payer: Self-pay

## 2021-01-25 ENCOUNTER — Ambulatory Visit
Admission: RE | Admit: 2021-01-25 | Discharge: 2021-01-25 | Disposition: A | Discharge status: Home / Self Care | Payer: Medicare Other | Source: Ambulatory Visit | Attending: Family Medicine | Admitting: Family Medicine

## 2021-01-25 DIAGNOSIS — Z1231 Encounter for screening mammogram for malignant neoplasm of breast: Secondary | ICD-10-CM

## 2021-04-17 ENCOUNTER — Ambulatory Visit (INDEPENDENT_AMBULATORY_CARE_PROVIDER_SITE_OTHER): Payer: Medicare Other | Admitting: Podiatry

## 2021-04-17 ENCOUNTER — Other Ambulatory Visit: Payer: Self-pay

## 2021-04-17 DIAGNOSIS — M79675 Pain in left toe(s): Secondary | ICD-10-CM

## 2021-04-17 DIAGNOSIS — B351 Tinea unguium: Secondary | ICD-10-CM | POA: Diagnosis not present

## 2021-04-17 DIAGNOSIS — E119 Type 2 diabetes mellitus without complications: Secondary | ICD-10-CM | POA: Diagnosis not present

## 2021-04-17 DIAGNOSIS — M79674 Pain in right toe(s): Secondary | ICD-10-CM

## 2021-04-18 ENCOUNTER — Ambulatory Visit: Payer: Medicare Other | Admitting: Podiatry

## 2021-04-19 ENCOUNTER — Encounter (HOSPITAL_COMMUNITY): Payer: Self-pay

## 2021-04-19 ENCOUNTER — Other Ambulatory Visit: Payer: Self-pay

## 2021-04-19 ENCOUNTER — Ambulatory Visit (HOSPITAL_COMMUNITY)
Admission: EM | Admit: 2021-04-19 | Discharge: 2021-04-19 | Disposition: A | Discharge status: Home / Self Care | Payer: Medicare Other | Attending: Internal Medicine | Admitting: Internal Medicine

## 2021-04-19 DIAGNOSIS — S139XXA Sprain of joints and ligaments of unspecified parts of neck, initial encounter: Secondary | ICD-10-CM | POA: Diagnosis not present

## 2021-04-19 NOTE — Discharge Instructions (Addendum)
Gentle range of motion Take antiinflammatories as needed for pain. Return to urgent care if you have worsening headache, persistent nausea,vomiting,blurred vision of confusion.
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
## 2021-04-20 ENCOUNTER — Encounter: Payer: Self-pay | Admitting: Podiatry

## 2021-04-20 NOTE — Progress Notes (Signed)
  Subjective:  Patient ID: [name], female    DOB: 11/03/1955,  MRN: [id number]  [name] presents to clinic today for preventative diabetic foot care and painful thick toenails that are difficult to trim. Pain interferes with ambulation. Aggravating factors include wearing enclosed shoe gear. Pain is relieved with periodic professional debridement.  Patient states blood glucose was 164 mg/dl today.  PCP is Leonard Downing, MD , and last visit was 2021.  Allergies  Allergen Reactions   Codeine     agitation    Review of Systems: Negative except as noted in the HPI. Objective:   Constitutional [name] is a pleasant 65 y.o. Caucasian female, in NAD. AAO x 3.   Vascular Capillary refill time to digits immediate b/l. Palpable DP pulse(s) b/l lower extremities Palpable PT pulse(s) b/l lower extremities Pedal hair sparse. Lower extremity skin temperature gradient within normal limits. No pain with calf compression b/l. No edema noted b/l lower extremities. No cyanosis or clubbing noted.  Neurologic Normal speech. Oriented to person, place, and time. Protective sensation intact 5/5 intact bilaterally with 10g monofilament b/l.  Dermatologic Pedal skin with normal turgor, texture and tone b/l lower extremities. No open wounds b/l lower extremities. No interdigital macerations b/l lower extremities. Toenails 1-5 b/l elongated, discolored, dystrophic, thickened, crumbly with subungual debris and tenderness to dorsal palpation.  Orthopedic: Normal muscle strength 5/5 to all lower extremity muscle groups bilaterally. No pain crepitus or joint limitation noted with ROM b/l lower extremities. Pes planus deformity noted b/l lower extremities.   Radiographs: None Assessment:   1. Pain due to onychomycosis of toenails of both feet   2. Controlled type 2 diabetes mellitus without complication, without long-term current use of insulin ([place])    Plan:  -No new findings. No new  orders. -Continue diabetic foot care principles. -Patient to continue soft, supportive shoe gear daily. -Toenails 1-5 b/l were debrided in length and girth with sterile nail nippers and dremel without iatrogenic bleeding.  -Patient to report any pedal injuries to medical professional immediately. -Patient/POA to call should there be question/concern in the interim.  No follow-ups on file.  Marzetta Board, DPM
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
## 2021-07-18 ENCOUNTER — Other Ambulatory Visit: Payer: Self-pay | Admitting: Pulmonary Disease

## 2021-07-19 ENCOUNTER — Ambulatory Visit: Payer: Medicare Other | Admitting: Pulmonary Disease

## 2021-07-24 ENCOUNTER — Encounter: Payer: Self-pay | Admitting: Podiatry

## 2021-07-24 ENCOUNTER — Ambulatory Visit (INDEPENDENT_AMBULATORY_CARE_PROVIDER_SITE_OTHER): Payer: Medicare Other | Admitting: Podiatry

## 2021-07-24 ENCOUNTER — Other Ambulatory Visit: Payer: Self-pay

## 2021-07-24 DIAGNOSIS — M79675 Pain in left toe(s): Secondary | ICD-10-CM

## 2021-07-24 DIAGNOSIS — M79674 Pain in right toe(s): Secondary | ICD-10-CM

## 2021-07-24 DIAGNOSIS — B351 Tinea unguium: Secondary | ICD-10-CM

## 2021-07-28 NOTE — Progress Notes (Signed)
Subjective: [name] is a 65 y.o. female patient seen today for follow up of preventative diabetic foot care with  painful thick toenails that are difficult to trim. Pain interferes with ambulation. Aggravating factors include wearing enclosed shoe gear. Pain is relieved with periodic professional debridement.  New problems reported today: None.  Patient states their blood glucose was 152 mg/dl today.   PCP is Leonard Downing, MD.   Allergies  Allergen Reactions   Codeine     agitation   Objective: Physical Exam  General: Patient is a pleasant 65 y.o. Caucasian female in NAD. AAO x 3.   Neurovascular Examination: CFT immediate b/l LE. Palpable DP/PT pulses b/l LE. Digital hair sparse b/l. Skin temperature gradient WNL b/l. No pain with calf compression b/l. No edema noted b/l. Lower extremity skin temperature gradient within normal limits.  Protective sensation intact 5/5 intact bilaterally with 10g monofilament b/l.  Dermatological:  Pedal skin warm and supple b/l.  No open wounds b/l. No interdigital macerations. Toenails 2-5 b/l elongated, thickened, discolored with subungual debris. +Tenderness with dorsal palpation of nailplates. Anonychia noted b/l great toes; nailbeds epithelialized. No hyperkeratotic nor porokeratotic lesions noted b/l. Toenails 1-5 b/l elongated, discolored, dystrophic, thickened, crumbly with subungual debris and tenderness to dorsal palpation.  Musculoskeletal:  Normal muscle strength 5/5 to all lower extremity muscle groups bilaterally. Pes planus deformity noted b/l lower extremities. Utilizes cane for ambulation assistance.  Assessment: 1. Pain due to onychomycosis of toenails of both feet     Plan: Patient was evaluated and treated and all questions answered. Consent given for treatment as described below: -Examined patient. -Continue diabetic foot care principles: inspect feet daily, monitor glucose as recommended by PCP and/or  Endocrinologist, and follow prescribed diet per PCP, Endocrinologist and/or dietician. -Toenails 1-5 b/l were debrided in length and girth with sterile nail nippers and dremel without iatrogenic bleeding.  -Patient/POA to call should there be question/concern in the interim.  Return in about 3 months (around 10/24/2021).  Marzetta Board, DPM

## 2021-09-28 ENCOUNTER — Other Ambulatory Visit: Payer: Self-pay | Admitting: Nephrology

## 2021-09-28 DIAGNOSIS — N183 Chronic kidney disease, stage 3 unspecified: Secondary | ICD-10-CM

## 2021-10-10 ENCOUNTER — Ambulatory Visit
Admission: RE | Admit: 2021-10-10 | Discharge: 2021-10-10 | Disposition: A | Discharge status: Home / Self Care | Payer: Medicare Other | Source: Ambulatory Visit | Attending: Nephrology | Admitting: Nephrology

## 2021-10-10 DIAGNOSIS — N183 Chronic kidney disease, stage 3 unspecified: Secondary | ICD-10-CM

## 2021-11-03 ENCOUNTER — Other Ambulatory Visit: Payer: Self-pay

## 2021-11-03 ENCOUNTER — Ambulatory Visit (INDEPENDENT_AMBULATORY_CARE_PROVIDER_SITE_OTHER): Payer: Medicare Other | Admitting: Podiatry

## 2021-11-03 DIAGNOSIS — B351 Tinea unguium: Secondary | ICD-10-CM

## 2021-11-03 DIAGNOSIS — E119 Type 2 diabetes mellitus without complications: Secondary | ICD-10-CM

## 2021-11-03 DIAGNOSIS — M79675 Pain in left toe(s): Secondary | ICD-10-CM

## 2021-11-03 DIAGNOSIS — M2141 Flat foot [pes planus] (acquired), right foot: Secondary | ICD-10-CM | POA: Diagnosis not present

## 2021-11-03 DIAGNOSIS — M79674 Pain in right toe(s): Secondary | ICD-10-CM

## 2021-11-03 DIAGNOSIS — M2142 Flat foot [pes planus] (acquired), left foot: Secondary | ICD-10-CM

## 2021-11-03 NOTE — Patient Instructions (Signed)

## 2021-11-09 ENCOUNTER — Other Ambulatory Visit: Payer: Self-pay | Admitting: Family Medicine

## 2021-11-09 DIAGNOSIS — Z1231 Encounter for screening mammogram for malignant neoplasm of breast: Secondary | ICD-10-CM

## 2021-11-11 NOTE — Progress Notes (Signed)
ANNUAL DIABETIC FOOT EXAM  Subjective: [name] presents today for for annual diabetic foot examination.  Patient relates 30+ year h/o diabetes.  Patient denies any h/o foot wounds.  Patient denies any numbness, tingling, burning, or pins/needle sensation in feet. She does relate occasional cramping in lower extremities bilaterally.  Patient's blood sugar was 138 mg/dl today.   Risk factors: diabetes, HTN.  Leonard Downing, MD is patient's PCP. Last visit was January, 2023.  Past Medical History:  Diagnosis Date   Anemia 09/25/2016   Bipolar affective disorder ([place]) 04/29/2012   Borderline personality disorder ([place])    Chronic back pain    Depression    Diabetes mellitus ([place]) 04/29/2012   Gait difficulty    GERD (gastroesophageal reflux disease)    HTN (hypertension) 04/29/2012   Hyperlipidemia    Hypothyroidism    Memory loss    Schizoaffective disorder ([place]) 04/29/2012   Schizophrenia ([place])    Patient Active Problem List   Diagnosis Date Noted   Angiodysplasia of duodenum 01/16/2021   Diarrhea 01/16/2021   Iron deficiency anemia 01/16/2021   Gait abnormality 09/11/2018   Memory loss 03/10/2018   Abnormal CT of the head 03/10/2018   Obesity (BMI 30-39.9) 11/06/2017   Schizophrenia, paranoid type ([place]) 05/05/2012   Anemia 04/30/2012   Hyponatremia 04/29/2012   Passive suicidal ideations 04/29/2012   Suicidal ideations 04/29/2012   HTN (hypertension) 04/29/2012   Diabetes mellitus ([place]) 04/29/2012   GERD (gastroesophageal reflux disease)    Hyperlipidemia    Chronic back pain    Hypothyroidism    Past Surgical History:  Procedure Laterality Date   APPENDECTOMY     CHOLECYSTECTOMY     COLONOSCOPY N/A 09/27/2016   Procedure: COLONOSCOPY;  Surgeon: Carol Ada, MD;  Location: [facility] ENDOSCOPY;  Service: Endoscopy;  Laterality: N/A;   ESOPHAGOGASTRODUODENOSCOPY N/A 09/26/2016   Procedure: ESOPHAGOGASTRODUODENOSCOPY (EGD);  Surgeon: Juanita Craver, MD;   Location[facility] ENDOSCOPY;  Service: Endoscopy;  Laterality: N/A;   GIVENS CAPSULE STUDY N/A 09/27/2016   Procedure: GIVENS CAPSULE STUDY;  Surgeon: Carol Ada, MD;  Location: [facility];  Service: Endoscopy;  Laterality: N/A;   KNEE SURGERY     right   Current Outpatient Medications on File Prior to Visit  Medication Sig Dispense Refill   ACCU-CHEK FASTCLIX LANCETS [place]      ACCU-CHEK GUIDE test strip      acetaminophen (TYLENOL) 325 MG tablet Take 650 mg by mouth 3 (three) times daily.     ALLERGY RELIEF 10 MG tablet Take 10 mg by mouth daily.     allopurinol (ZYLOPRIM) 100 MG tablet Take 100 mg by mouth 2 (two) times daily.     ALLOPURINOL PO      atorvastatin (LIPITOR) 40 MG tablet Take 40 mg by mouth daily at 6 PM.     ATORVASTATIN CALCIUM PO      Blood Glucose Monitoring Suppl (ACCU-CHEK GUIDE) w/Device KIT      [employer]S ANTACID PLUS ANTIGAS 400-400-40 MG/5ML suspension TAKE 30 ML BY MOUTH EVERY 6 HOURS AS NEEDED FOR GAS PAIN     divalproex (DEPAKOTE ER) 500 MG 24 hr tablet Take 1,000 mg by mouth at bedtime.      DIVALPROEX SODIUM PO      donepezil (ARICEPT) 10 MG tablet Take 1 tablet (10 mg total) by mouth daily. Future refills my be managed by PCP. 30 tablet 5   DONEPEZIL HCL PO      DROPSAFE SAFETY PEN NEEDLES 31G X  6 MM MISC      Ergocalciferol (VITAMIN D2 PO)      escitalopram (LEXAPRO) 20 MG tablet Take 20 mg by mouth every morning.      ESCITALOPRAM OXALATE PO      famotidine (PEPCID) 20 MG tablet Take 20 mg by mouth 2 (two) times daily.     FAMOTIDINE PO      FARXIGA 10 MG TABS tablet Take 10 mg by mouth daily.     FARXIGA 5 MG TABS tablet Take 5 mg by mouth daily.     Ferrous Sulfate (FEROSUL PO)      fluticasone (FLONASE) 50 MCG/ACT nasal spray Place 2 sprays into both nostrils daily. 16 g 11   GAVILAX 17 GM/SCOOP powder SMARTSIG:1 Capful(s) By Mouth Daily PRN     glipiZIDE (GLUCOTROL) 5 MG tablet Take 5 mg by mouth 2 (two) times daily.     GLIPIZIDE PO      INVOKANA 100  MG TABS tablet      JANUVIA 50 MG tablet Take 50 mg by mouth daily.     lansoprazole (PREVACID) 15 MG capsule Take 1 capsule (15 mg total) by mouth daily at 12 noon. 30 capsule 2   LANTUS SOLOSTAR 100 UNIT/ML Solostar Pen Inject 10 Units into the skin at bedtime.     latanoprost (XALATAN) 0.005 % ophthalmic solution Place 1 drop into both eyes at bedtime.     LATANOPROST OP      levothyroxine (SYNTHROID, LEVOTHROID) 100 MCG tablet      LEVOTHYROXINE SODIUM PO      lisinopril (PRINIVIL,ZESTRIL) 5 MG tablet      LISINOPRIL PO      LOKELMA 10 g PACK packet Take by mouth.     LORATADINE PO      METFORMIN HCL PO      moxifloxacin (VIGAMOX) 0.5 % ophthalmic solution INSTILL 1 DROP INTO LEFTOEYE FOUR TIMES DAILY STARTING 2 DAYS PRIOR TO SURGERY AND 2 DROPS MORNING OF SURGERY.     paliperidone (INVEGA SUSTENNA) 234 MG/1.5ML SUSP injection Inject 234 mg into the muscle every 30 (thirty) days. evry 2 -3 months     PALIPERIDONE ER PO      Paliperidone Palmitate (INVEGA SUSTENNA IM)      pantoprazole (PROTONIX) 20 MG tablet Take 20 mg by mouth daily.     Polyethylene Glycol 3350 (CLEARLAX PO)      PROPRANOLOL HCL PO      SENNA-TABS 8.6 MG tablet Take 2 tablets by mouth daily.     SITagliptin Phosphate (JANUVIA PO)      traZODone (DESYREL) 100 MG tablet Take 100 mg by mouth at bedtime.      TRAZODONE HCL PO      Vitamin D, Ergocalciferol, (DRISDOL) 1.25 MG (50000 UT) CAPS capsule Take 50,000 Units by mouth once a week.     No current facility-administered medications on file prior to visit.    Allergies  Allergen Reactions   Codeine     agitation   Social History   Occupational History   Occupation: disabled  Tobacco Use   Smoking status: Former    Types: Cigarettes    Quit date: 2008    Years since quitting: 15.1   Smokeless tobacco: Never  Vaping Use   Vaping Use: Never used  Substance and Sexual Activity   Alcohol use: No    Comment: h/o use but none in years   Drug use: No     Comment: remote h/o  drug use - cocaine, marijuana, uppers, downers, qualudes   Sexual activity: Never   Family History  Problem Relation Age of Onset   CVA Mother 40   Dementia Father 74   CAD Maternal Grandmother    Colon cancer Maternal Grandfather    Immunization History  Administered Date(s) Administered   Influenza,inj,Quad PF,6+ Mos 07/17/2019   Influenza-Unspecified 05/18/2014, 09/07/2019     Review of Systems: Negative except as noted in the HPI.   Objective: There were no vitals filed for this visit.  ELENORE WANNINGER is a pleasant 66 y.o. female in NAD. AAO X 3.  Vascular Examination: CFT immediate b/l LE. Palpable DP/PT pulses b/l LE. Digital hair sparse b/l. Skin temperature gradient WNL b/l. No pain with calf compression b/l. No edema noted b/l. No cyanosis or clubbing noted b/l LE.  Dermatological Examination: Pedal integument with normal turgor, texture and tone b/l LE. No open wounds b/l. No interdigital macerations b/l. Toenails 1-5 b/l elongated, thickened, discolored with subungual debris. +Tenderness with dorsal palpation of nailplates. No hyperkeratotic or porokeratotic lesions present.  Musculoskeletal Examination: Normal muscle strength 5/5 to all lower extremity muscle groups bilaterally. Pes planus deformity noted bilateral LE.Marland Kitchen No pain, crepitus or joint limitation noted with ROM b/l LE.  Patient ambulates independently without assistive aids.  Footwear Assessment: Does the patient wear appropriate shoes? Yes. Does the patient need inserts/orthotics? No.  Neurological Examination: Protective sensation intact 5/5 intact bilaterally with 10g monofilament b/l. Vibratory sensation intact b/l.  Assessment: 1. Pain due to onychomycosis of toenails of both feet   2. Pes planus of both feet   3. Controlled type 2 diabetes mellitus without complication, without long-term current use of insulin ([place])   4. Encounter for diabetic foot exam ([place])      ADA  Risk Categorization: Low Risk :  Patient has all of the following: Intact protective sensation No prior foot ulcer  No severe deformity Pedal pulses present  Plan: -Diabetic foot examination performed today. -Continue foot and shoe inspections daily. Monitor blood glucose per PCP/Endocrinologist's recommendations. -Mycotic toenails 1-5 bilaterally were debrided in length and girth with sterile nail nippers and dremel without incident. -Patient/POA to call should there be question/concern in the interim.  Return in about 3 months (around 01/31/2022).  Marzetta Board, DPM

## 2021-12-11 IMAGING — MG DIGITAL SCREENING BILAT W/ CAD
5 series · 5 of 5 positions shown · non-contrast
Comparison: Previous exam(s).

CLINICAL DATA: Screening.

EXAM:
DIGITAL SCREENING BILATERAL MAMMOGRAM WITH CAD

[R MLO]
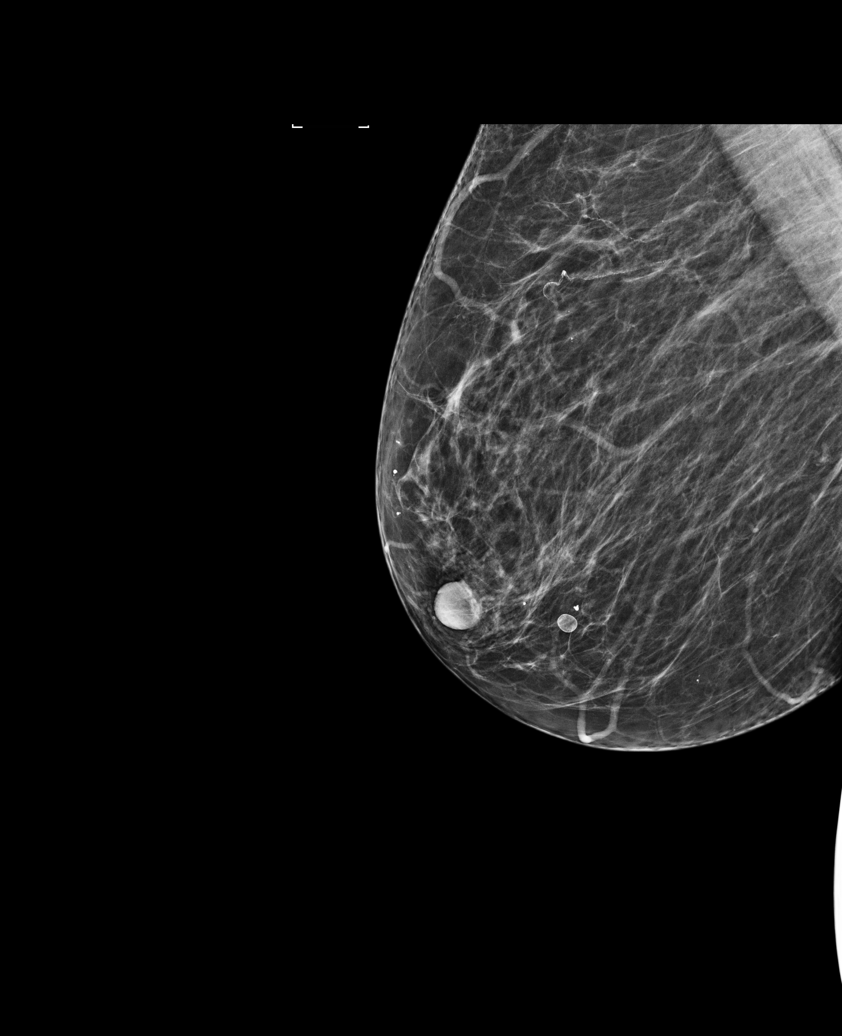

[L CC]
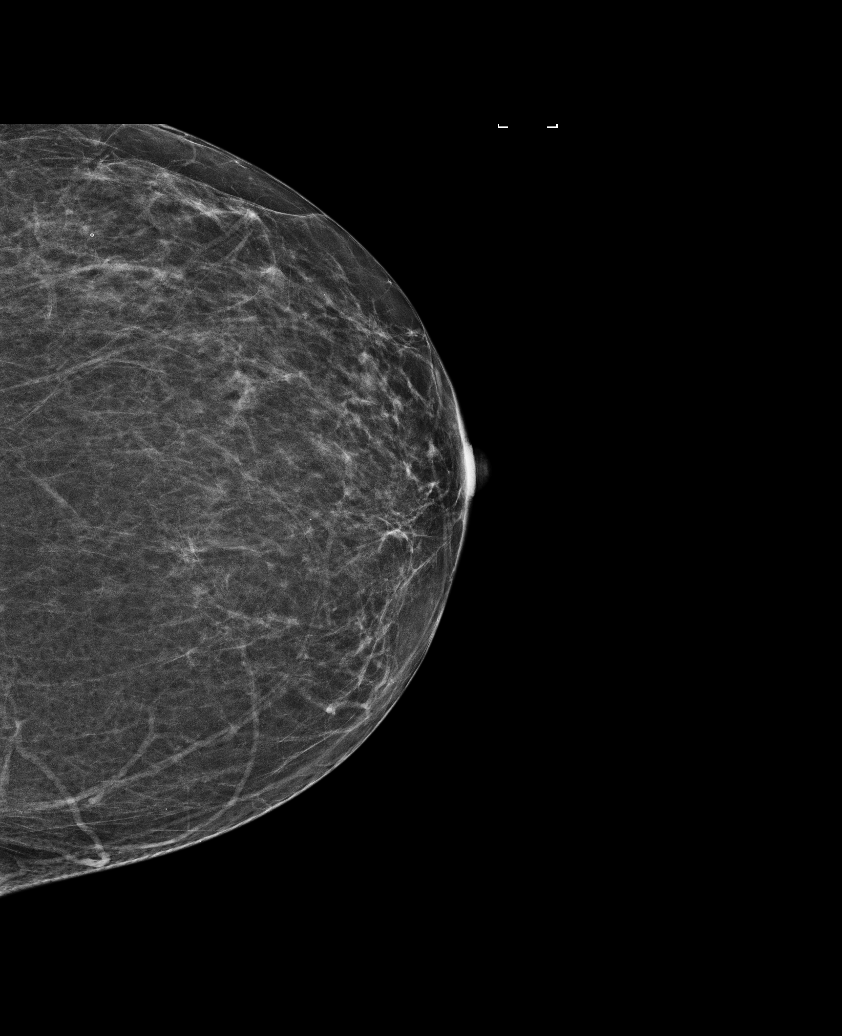

[R CC]
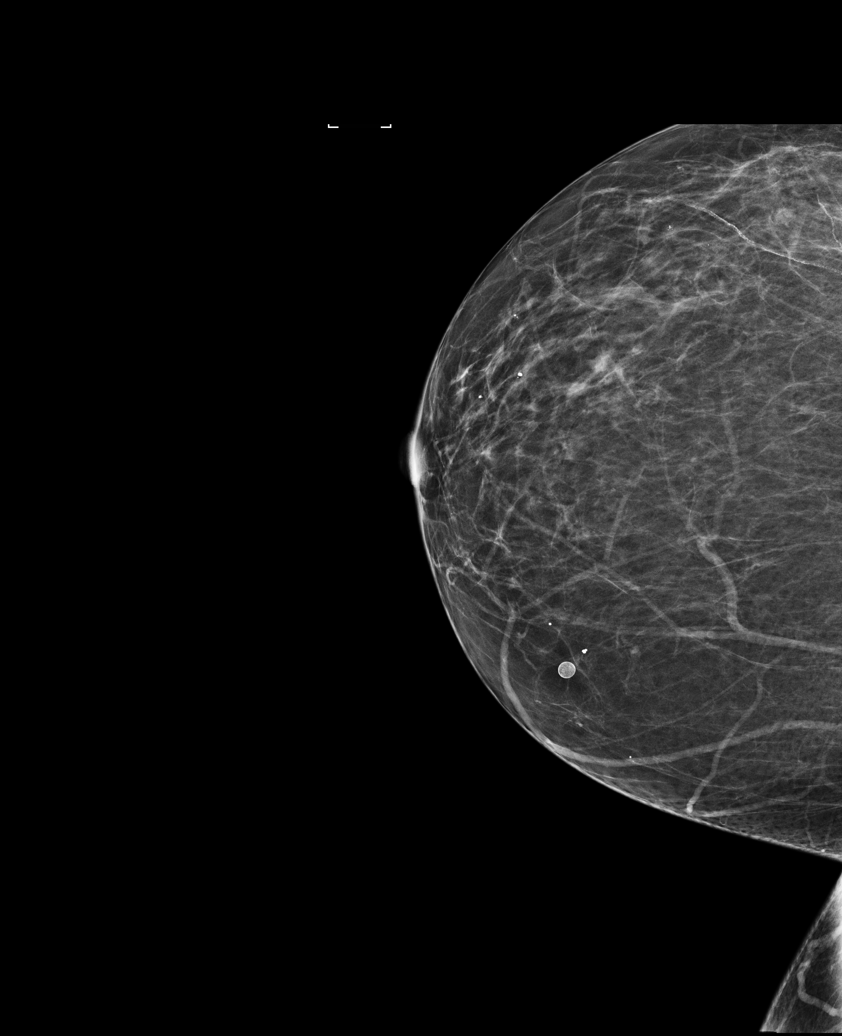

[L MLO (1 of 2)]
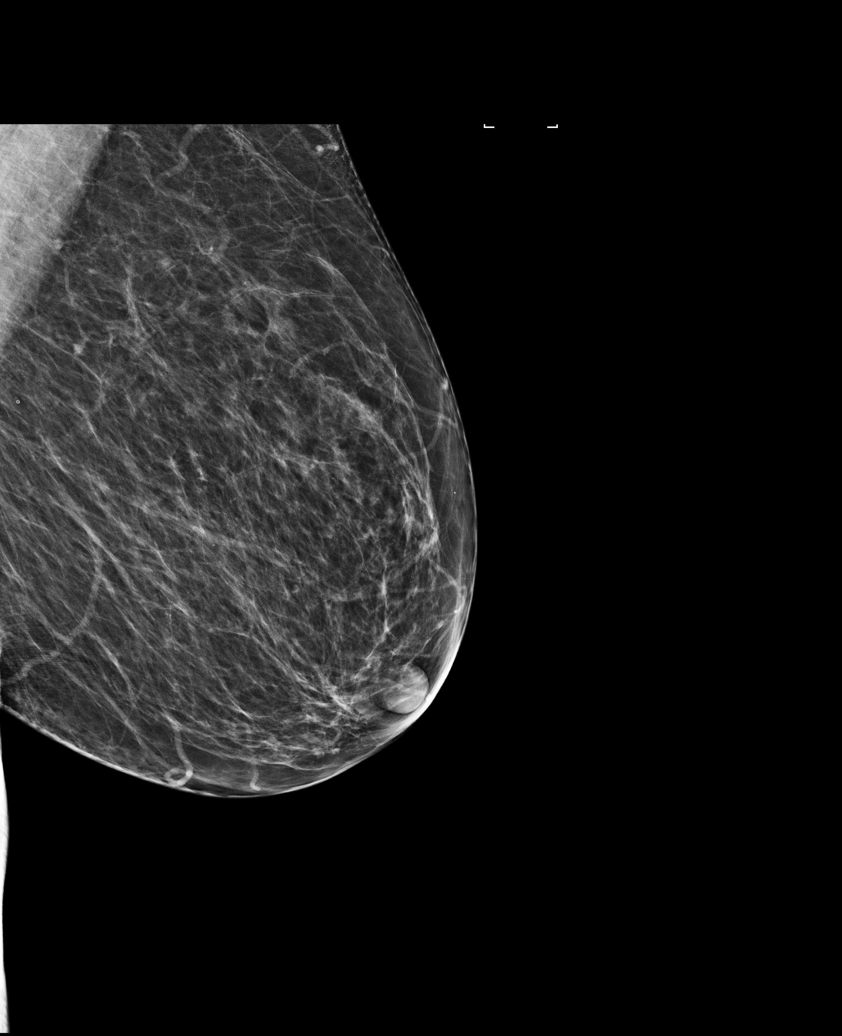

[L MLO (2 of 2)]
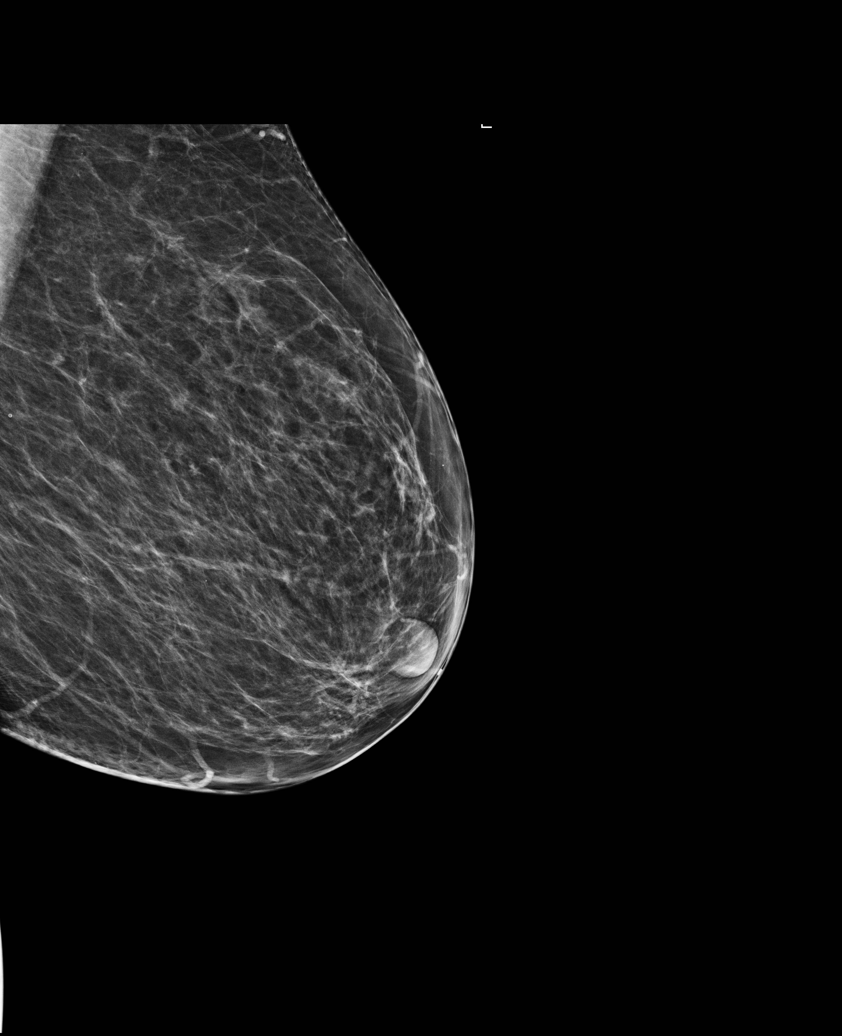

[5 of 5 positions shown; findings below may reference images not displayed]

ACR Breast Density Category b: There are scattered areas of
fibroglandular density.
FINDINGS: There are no findings suspicious for malignancy. Images were
processed with CAD.
IMPRESSION: No mammographic evidence of malignancy. A result letter of this
screening mammogram will be mailed directly to the patient.

RECOMMENDATION:
Screening mammogram in one year. (Code:AS-G-LCT)

BI-RADS CATEGORY  1: Negative.

## 2022-01-30 ENCOUNTER — Ambulatory Visit: Payer: Medicare Other

## 2022-01-30 ENCOUNTER — Ambulatory Visit
Admission: RE | Admit: 2022-01-30 | Discharge: 2022-01-30 | Disposition: A | Discharge status: Home / Self Care | Payer: Medicare Other | Source: Ambulatory Visit | Attending: Family Medicine | Admitting: Family Medicine

## 2022-01-30 ENCOUNTER — Telehealth: Payer: Self-pay

## 2022-01-30 DIAGNOSIS — Z1231 Encounter for screening mammogram for malignant neoplasm of breast: Secondary | ICD-10-CM

## 2022-01-30 NOTE — Telephone Encounter (Signed)
NOTES SCANNED TO REFERRAL 

## 2022-01-31 ENCOUNTER — Ambulatory Visit (INDEPENDENT_AMBULATORY_CARE_PROVIDER_SITE_OTHER): Payer: Medicare Other | Admitting: Podiatry

## 2022-01-31 ENCOUNTER — Encounter: Payer: Self-pay | Admitting: Podiatry

## 2022-01-31 DIAGNOSIS — M79674 Pain in right toe(s): Secondary | ICD-10-CM | POA: Diagnosis not present

## 2022-01-31 DIAGNOSIS — B351 Tinea unguium: Secondary | ICD-10-CM

## 2022-01-31 DIAGNOSIS — E119 Type 2 diabetes mellitus without complications: Secondary | ICD-10-CM

## 2022-01-31 DIAGNOSIS — M79675 Pain in left toe(s): Secondary | ICD-10-CM | POA: Diagnosis not present

## 2022-02-01 ENCOUNTER — Other Ambulatory Visit (HOSPITAL_COMMUNITY)
Admission: RE | Admit: 2022-02-01 | Discharge: 2022-02-01 | Disposition: A | Discharge status: Home / Self Care | Payer: Medicare Other | Source: Ambulatory Visit

## 2022-02-01 ENCOUNTER — Ambulatory Visit (INDEPENDENT_AMBULATORY_CARE_PROVIDER_SITE_OTHER): Payer: Medicare Other

## 2022-02-01 VITALS — BP 104/62 | HR 85 | Wt 211.0 lb

## 2022-02-01 DIAGNOSIS — N898 Other specified noninflammatory disorders of vagina: Secondary | ICD-10-CM | POA: Diagnosis present

## 2022-02-01 DIAGNOSIS — R21 Rash and other nonspecific skin eruption: Secondary | ICD-10-CM

## 2022-02-01 DIAGNOSIS — Z1151 Encounter for screening for human papillomavirus (HPV): Secondary | ICD-10-CM | POA: Insufficient documentation

## 2022-02-01 DIAGNOSIS — Z01419 Encounter for gynecological examination (general) (routine) without abnormal findings: Secondary | ICD-10-CM | POA: Diagnosis present

## 2022-02-01 MED ORDER — TERCONAZOLE 0.4 % VA CREA: 1.0000 | TOPICAL_CREAM | Freq: Every day | VAGINAL | 0 refills | Status: AC

## 2022-02-01 MED ORDER — NYSTATIN 100000 UNIT/GM EX POWD: 1.0000 "application " | Freq: Three times a day (TID) | CUTANEOUS | 0 refills | Status: DC

## 2022-02-01 NOTE — Progress Notes (Signed)
   Subjective:     [name] is a 66 y.o. female here at [facility] for an annual exam. Current complaints: vaginal irritation. Thinks related to using "feminine wash". Was previously using a "feminine spray", but stopped using. She also reports a rash under her breasts and like it "feels raw".  Personal health questionnaire reviewed: yes.  Do you have a primary care provider? yes Do you feel safe at home? yes   Health Maintenance Due  Topic Date Due   COVID-19 Vaccine (1) Never done   OPHTHALMOLOGY EXAM  Never done   Hepatitis C Screening  Never done   TETANUS/TDAP  Never done   PAP SMEAR-Modifier  Never done   Zoster Vaccines- Shingrix (1 of 2) Never done   HEMOGLOBIN A1C  09/09/2018     Risk factors for chronic health problems: Smoking: no Alchohol/how much: no Pt BMI: Body mass index is 35.11 kg/m.   Gynecologic History No LMP recorded. Patient is postmenopausal. Contraception: none Sexual health: never been sexually active Last Pap: unsure. Patient reports results were: normal Last mammogram: 01/30/22. Results were: normal  Obstetric History OB History  No obstetric history on file.     The following portions of the patient's history were reviewed and updated as appropriate: allergies, current medications, past family history, past medical history, past social history, past surgical history, and problem list.  Review of Systems Pertinent items are noted in HPI.    Objective:   BP 104/62   Pulse 85   Wt 211 lb (95.7 kg)   BMI 35.11 kg/m  VS reviewed, nursing note reviewed,  Constitutional: well developed, well nourished, no distress HEENT: normocephalic CV: normal rate Pulm/chest wall: normal effort Breast Exam: performed: erythematous, excoriated appearing rash under both breast.  Abdomen: soft Neuro: alert and oriented x 3 Skin: warm, dry Psych: affect normal Pelvic exam: Performed: External genitalia atrophic, erythematous, ?yeast, nulliparous  cervix pink, visually closed, without lesion, scant white creamy discharge, vaginal walls pink with minimal rugae Bimanual exam: Cervix 0/long/high, firm, anterior, neg CMT, uterus nontender, nonenlarged, adnexa without tenderness, enlargement, or mass      Assessment/Plan:   1. Well woman exam with routine gynecological exam - Pap collected - Patient up to date with mammogram; PCP to schedule colonoscopy per patient  - Cytology - PAP( [facility])  2. Vaginal irritation - Vaginal swabs collected - Patient to stop using fragrance wash, make sure area is clean and dry  - Cervicovaginal ancillary only( [facility]) - terconazole (TERAZOL 7) 0.4 % vaginal cream; Place 1 applicator vaginally at bedtime. Use for seven days  Dispense: 45 g; Refill: 0  3. Rash - Will rx Nystatin powder - Keep area clean, dry, avoid scratching, wear loose fitting clothing until area heals  - nystatin (MYCOSTATIN/NYSTOP) powder; Apply 1 application. topically 3 (three) times daily.  Dispense: 15 g; Refill: 0    Return in about 1 year (around 02/02/2023).   Renee Harder, CNM 12:03 PM

## 2022-02-02 LAB — CYTOLOGY - PAP
Comment: NEGATIVE
Diagnosis: NEGATIVE
High risk HPV: NEGATIVE

## 2022-02-02 LAB — CERVICOVAGINAL ANCILLARY ONLY
Bacterial Vaginitis (gardnerella): NEGATIVE
Candida Glabrata: NEGATIVE
Candida Vaginitis: POSITIVE — AB
Comment: NEGATIVE
Comment: NEGATIVE
Comment: NEGATIVE

## 2022-02-06 ENCOUNTER — Ambulatory Visit (INDEPENDENT_AMBULATORY_CARE_PROVIDER_SITE_OTHER): Payer: Medicare Other | Admitting: Cardiology

## 2022-02-06 ENCOUNTER — Ambulatory Visit (INDEPENDENT_AMBULATORY_CARE_PROVIDER_SITE_OTHER): Payer: Medicare Other

## 2022-02-06 VITALS — BP 124/70 | HR 75 | Ht 64.0 in | Wt 214.2 lb

## 2022-02-06 DIAGNOSIS — R079 Chest pain, unspecified: Secondary | ICD-10-CM

## 2022-02-06 DIAGNOSIS — I493 Ventricular premature depolarization: Secondary | ICD-10-CM

## 2022-02-06 DIAGNOSIS — R002 Palpitations: Secondary | ICD-10-CM

## 2022-02-06 DIAGNOSIS — I10 Essential (primary) hypertension: Secondary | ICD-10-CM

## 2022-02-06 DIAGNOSIS — E119 Type 2 diabetes mellitus without complications: Secondary | ICD-10-CM

## 2022-02-06 DIAGNOSIS — Z794 Long term (current) use of insulin: Secondary | ICD-10-CM

## 2022-02-06 DIAGNOSIS — E669 Obesity, unspecified: Secondary | ICD-10-CM | POA: Diagnosis not present

## 2022-02-06 DIAGNOSIS — F200 Paranoid schizophrenia: Secondary | ICD-10-CM

## 2022-02-06 NOTE — Progress Notes (Unsigned)
Enrolled patient for a 14 day Zio XT  monitor to be mailed to patients home  °

## 2022-02-06 NOTE — Progress Notes (Unsigned)
Primary Care Provider: Leonard Downing, MD; [place]. Burt Ek, [facility] Cardiologist: Glenetta Hew, MD Electrophysiologist: None  Clinic Note: Chief Complaint  Patient presents with   New Patient (Initial Visit)   Palpitations   Chest Pain    Long-lasting, constant    ===================================  ASSESSMENT/PLAN   Problem List Items Addressed This Visit       Cardiology Problems   HTN (hypertension) (Chronic)    Blood pressure is well controlled.  She is actually not currently on any medications.       PVC (premature ventricular contraction) - Primary (Chronic)    Notable PVCs on EKG annual exam.  She does feel these off-and-on palpitations which seem to be more of a concern for her today actual chest pain symptoms.  I would like to see her PVC burden because this may change the trajectory of how we evaluate her.  Since she is not actively having significant chest pain I would like to first determine her PVC burden in order to determine whether we proceed with ischemic evaluation or not. Plan: 14-day Zio patch        Relevant Orders   EKG 12-Lead (Completed)   LONG TERM MONITOR (3-14 DAYS)     Other   Diabetes mellitus type II, controlled, with no complications (HCC) (Chronic)    A1c has been around 7.  She is on Farxiga Januvia, Lantus.  Would consider the possibility of Ozempic given her obesity.       Obesity (BMI 30-39.9)    Obese.  She is on lots of medications, could consider the possibility of Ozempic or Mounjaro (GLP-1 1 agonist and diabetes with obesity)       Palpitations    As noted, palpitations seem to be more the more concerning speech is of her symptoms.  14-day Zio patch       Relevant Orders   EKG 12-Lead (Completed)   LONG TERM MONITOR (3-14 DAYS)   Chest pain of uncertain etiology    Somewhat atypical features of chest pain.  Difficult to get a story from her.  From what is  described by the referring provider as chest pain walking on treadmill, this is somewhat concerning.  However, that did not seem to be the same symptom when I was asking the questions.  We may very well proceed with ischemic evaluation, but first I think we would like to get a determination of her PVC burden.  If indeed there are significant PVCs, then I think perhaps Coronary CTA would be a better option than a stress test (question her ability to comprehend and cooperate with stress test versus CTA.)  She does have notable cardiac risk factors of diabetes, hyperlipidemia and obesity (as well as former smoker).  Reassess symptoms at follow-up visit as well as after evaluation of the monitor.        ===================================  HPI:    [name] is a 66 y.o. female with Bipolar Affective Disorder (/Schizoaffective Disorder/Schizophrenia), Hypertension, Hyperlipidemia And Diabetes Type 2 who is being seen today for the evaluation of CHEST PAIN WITH ACTIVITY at the request of Leonard Downing, *.  [name] was last seen on Jan 24, 2022 at [facility] is a reschedule from, previous appointments.  The complaint was left arm and shoulder pain across the chest left arm and fingers.  Noted tingling with pain in the neck head and jaw described the pain is constant with  no change with or without exertion.  Also notes some nausea.  There was some thought about being decreased left arm and shoulder motion with radiculopathy.  Recent Hospitalizations: None  Reviewed  CV studies:    The following studies were reviewed today: (if available, images/films reviewed: From Epic Chart or Care Everywhere) None:  Interval History:   [name] presents here today along with the caregivers at facility and.  She is a relatively poor historian does not really answer questions openly.  She describes me off-and-on feelings of palpitations lasting maybe 1 or 2  minutes.  She says she has some shortness of breath with or without exertion.  Maybe when she is trying to exercise, but not while shopping.  Describes as left-sided neck and arm pain where she feels the tingling and numbness on her arm but it is there pretty much consistently.  She initially indicates that it really was not exacerbated by exertion, but then it and it another part of the discussion as he says it may get worse with exertion it was very difficult to tell the nature of the discomfort.  She does not always say that is associated with dyspnea although she may get some exertional dyspnea.  There is no real consistency with how or when this discomfort occurs.  She has not had any dizziness or wooziness.  No syncope or near syncope.  No PND, orthopnea or edema.  No TIA or questions fugax.  REVIEWED OF SYSTEMS   Review of Systems  Constitutional:  Positive for malaise/fatigue. Negative for weight loss.  HENT:  Negative for congestion.   Respiratory:  Positive for cough. Negative for shortness of breath and wheezing.   Cardiovascular:        Per HPI  Gastrointestinal:  Negative for blood in stool and melena.  Genitourinary:  Negative for hematuria.  Musculoskeletal:  Positive for joint pain (Left shoulder) and neck pain.  Neurological:  Negative for dizziness and focal weakness.  Psychiatric/Behavioral:  Negative for depression (Stable) and memory loss (Not necessarily sure about memory loss versus just poor historian.). The patient does not have insomnia.        Seems to be relatively stable psychiatric standpoint   I have reviewed and (if needed) personally updated the patient's problem list, medications, allergies, past medical and surgical history, social and family history.   PAST MEDICAL HISTORY   Past Medical History:  Diagnosis Date   Anemia 09/25/2016   Bipolar affective disorder (HCC) 04/29/2012   Borderline personality disorder (HCC)    Chronic back pain    Depression     Diabetes mellitus (HCC) 04/29/2012   Gait difficulty    GERD (gastroesophageal reflux disease)    HTN (hypertension) 04/29/2012   Hyperlipidemia    Hypothyroidism    Memory loss    Schizoaffective disorder (HCC) 04/29/2012   Schizophrenia (HCC)     PAST SURGICAL HISTORY   Past Surgical History:  Procedure Laterality Date   APPENDECTOMY     CHOLECYSTECTOMY     COLONOSCOPY N/A 09/27/2016   Procedure: COLONOSCOPY;  Surgeon: Jeani Hawking, MD;  Location: [facility] ENDOSCOPY;  Service: Endoscopy;  Laterality: N/A;   ESOPHAGOGASTRODUODENOSCOPY N/A 09/26/2016   Procedure: ESOPHAGOGASTRODUODENOSCOPY (EGD);  Surgeon: Charna Elizabeth, MD;  Location: [facility] ENDOSCOPY;  Service: Endoscopy;  Laterality: N/A;   GIVENS CAPSULE STUDY N/A 09/27/2016   Procedure: GIVENS CAPSULE STUDY;  Surgeon: Jeani Hawking, MD;  Location: [facility] ENDOSCOPY;  Service: Endoscopy;  Laterality: N/A;   KNEE SURGERY  right    Immunization History  Administered Date(s) Administered   Influenza,inj,Quad PF,6+ Mos 07/17/2019   Influenza-Unspecified 05/18/2014, 09/07/2019    MEDICATIONS/ALLERGIES   Current Meds  Medication Sig   ACCU-CHEK FASTCLIX LANCETS MISC    ACCU-CHEK GUIDE test strip    acetaminophen (TYLENOL) 325 MG tablet Take 650 mg by mouth 3 (three) times daily.   ALLERGY RELIEF 10 MG tablet Take 10 mg by mouth daily.   allopurinol (ZYLOPRIM) 100 MG tablet Take 100 mg by mouth 2 (two) times daily.   atorvastatin (LIPITOR) 40 MG tablet Take 40 mg by mouth daily at 6 PM.   Blood Glucose Monitoring Suppl (ACCU-CHEK GUIDE) w/Device KIT    [employer] ANTACID PLUS ANTIGAS 400-400-40 MG/5ML suspension TAKE 30 ML BY MOUTH EVERY 6 HOURS AS NEEDED FOR GAS PAIN   divalproex (DEPAKOTE ER) 500 MG 24 hr tablet Take 1,000 mg by mouth at bedtime.    donepezil (ARICEPT) 10 MG tablet Take 1 tablet (10 mg total) by mouth daily. Future refills my be managed by PCP.   DROPSAFE SAFETY PEN NEEDLES 31G X 6 MM MISC    Ergocalciferol (VITAMIN D2 PO)     escitalopram (LEXAPRO) 20 MG tablet Take 20 mg by mouth every morning.    famotidine (PEPCID) 20 MG tablet Take 20 mg by mouth 2 (two) times daily.   FARXIGA 10 MG TABS tablet Take 10 mg by mouth daily.   Ferrous Sulfate (FEROSUL PO)    fluticasone (FLONASE) 50 MCG/ACT nasal spray Place 2 sprays into both nostrils daily.   GAVILAX 17 GM/SCOOP powder SMARTSIG:1 Capful(s) By Mouth Daily PRN   glipiZIDE (GLUCOTROL) 5 MG tablet Take 5 mg by mouth 2 (two) times daily.   JANUVIA 50 MG tablet Take 50 mg by mouth daily.   lansoprazole (PREVACID) 15 MG capsule Take 1 capsule (15 mg total) by mouth daily at 12 noon.   LANTUS SOLOSTAR 100 UNIT/ML Solostar Pen Inject 10 Units into the skin at bedtime.   latanoprost (XALATAN) 0.005 % ophthalmic solution Place 1 drop into both eyes at bedtime.   levothyroxine (SYNTHROID, LEVOTHROID) 100 MCG tablet    LOKELMA 10 g PACK packet Take by mouth.   moxifloxacin (VIGAMOX) 0.5 % ophthalmic solution INSTILL 1 DROP INTO LEFTOEYE FOUR TIMES DAILY STARTING 2 DAYS PRIOR TO SURGERY AND 2 DROPS MORNING OF SURGERY.   nystatin (MYCOSTATIN/NYSTOP) powder Apply 1 application. topically 3 (three) times daily.   paliperidone (INVEGA SUSTENNA) 234 MG/1.5ML SUSP injection Inject 234 mg into the muscle every 30 (thirty) days. evry 2 -3 months   Paliperidone Palmitate (INVEGA SUSTENNA IM)    pantoprazole (PROTONIX) 20 MG tablet Take 20 mg by mouth daily.   Polyethylene Glycol 3350 (CLEARLAX PO)    SENNA-TABS 8.6 MG tablet Take 2 tablets by mouth daily.   SITagliptin Phosphate (JANUVIA PO)    terconazole (TERAZOL 7) 0.4 % vaginal cream Place 1 applicator vaginally at bedtime. Use for seven days   traZODone (DESYREL) 100 MG tablet Take 100 mg by mouth at bedtime.    Vitamin D, Ergocalciferol, (DRISDOL) 1.25 MG (50000 UT) CAPS capsule Take 50,000 Units by mouth once a week.   [DISCONTINUED] DONEPEZIL HCL PO    [DISCONTINUED] TRAZODONE HCL PO     Allergies  Allergen Reactions    Codeine     agitation    SOCIAL HISTORY/FAMILY HISTORY   Reviewed in Epic:   Social History   Tobacco Use   Smoking status: Former  Types: Cigarettes    Quit date: 2008    Years since quitting: 15.4   Smokeless tobacco: Never  Vaping Use   Vaping Use: Never used  Substance Use Topics   Alcohol use: No    Comment: h/o use but none in years   Drug use: No    Comment: remote h/o drug use - cocaine, marijuana, uppers, downers, qualudes   Social History   Social History Narrative   Lives in [place] with five other ladies and staff.   Right-handed.   Occasional caffeine use.   Family History  Problem Relation Age of Onset   CVA Mother 54   Dementia Father 82   CAD Maternal Grandmother    Colon cancer Maternal Grandfather     OBJCTIVE -PE, EKG, labs   Wt Readings from Last 3 Encounters:  02/06/22 214 lb 3.2 oz (97.2 kg)  02/01/22 211 lb (95.7 kg)  08/23/20 201 lb 9.6 oz (91.4 kg)    Physical Exam: BP 124/70   Pulse 75   Ht $R'5\' 4"'TY$  (1.626 m)   Wt 214 lb 3.2 oz (97.2 kg)   SpO2 96%   BMI 36.77 kg/m  Physical Exam Vitals reviewed.  Constitutional:      General: She is not in acute distress.    Appearance: She is obese. She is not ill-appearing.  HENT:     Head: Normocephalic and atraumatic.  Neck:     Vascular: No carotid bruit, hepatojugular reflux or JVD.  Cardiovascular:     Rate and Rhythm: Normal rate and regular rhythm. Occasional Extrasystoles are present.    Chest Wall: PMI is not displaced.     Pulses: Normal pulses.     Heart sounds: Normal heart sounds, S1 normal and S2 normal. No murmur heard.   No friction rub. No gallop.  Pulmonary:     Effort: Pulmonary effort is normal. No respiratory distress.     Breath sounds: No wheezing, rhonchi or rales.  Chest:     Chest wall: Tenderness (Costal sternal as well as left shoulder discomfort.) present.  Abdominal:     General: Abdomen is flat. Bowel sounds are normal. There is  no distension.     Palpations: Abdomen is soft.     Tenderness: There is no abdominal tenderness. There is no guarding or rebound.  Musculoskeletal:        General: No swelling. Normal range of motion.     Cervical back: Normal range of motion. Normal range of motion (Somewhat stiff, tender to palpation on the left SCM).  Skin:    General: Skin is warm and dry.  Neurological:     General: No focal deficit present.     Mental Status: She is alert and oriented to person, place, and time.     Gait: Gait normal.  Psychiatric:        Mood and Affect: Mood normal. Affect is blunt and flat.        Speech: Speech is not delayed.        Behavior: Behavior is slowed and withdrawn.        Thought Content: Thought content normal.        Cognition and Memory: Cognition is impaired.     Comments: Borderline noncommunicative     Adult ECG Report  Rate: 75;  Rhythm: premature ventricular contractions (PVC);   Narrative Interpretation: Frequent PVCs but otherwise normal axis, intervals, and durations.  Recent Labs:   01/24/2022 Na+ 138, K+  5.3, Cl- 96, HCO3-27, BUN 25, Cr 1.57, Glu 144, Ca2+ 10.0; AST 11, ALT 7, AlkP 50 CBC: W 7.6, H/H 13.5/40.6, Plt 255 TC 143, TG 122, HDL 43, LDL 78; TSH 2.65; A1c 7.2  10/11/2021 Na+ 138, K+ 5.1, Cl- 101, HCO3-27, BUN 17, Cr 1.3, Glu 196, Ca2+ 9.6; AST 13, ALT 7, AlkP 49; Mag 2.2 CBC: W 7.9, H/H 13.4/40.2, Plt 252. TC 144, TG 68, HDL 49, LDL 81 (improved from 98.).  [place]-; A1c 7.3 (up from 6.4); TSH 2.37.   No results found for: CHOL, HDL, LDLCALC, LDLDIRECT, TRIG, CHOLHDL Lab Results  Component Value Date   CREATININE 0.96 03/10/2018   BUN 10 03/10/2018   NA 140 03/10/2018   K 4.9 03/10/2018   CL 102 03/10/2018   CO2 24 03/10/2018      Latest Ref Rng & Units 03/10/2018    2:56 PM 10/16/2017    7:54 PM 09/28/2016    8:56 AM  CBC  WBC 3.4 - 10.8 x10E3/uL 7.0   7.0   12.3    Hemoglobin 11.1 - 15.9 g/dL 11.5   11.6   8.6    Hematocrit 34.0 - 46.6 %  34.0   35.0   28.1    Platelets 150 - 450 x10E3/uL 343   340   462      Lab Results  Component Value Date   HGBA1C 6.8 (H) 03/10/2018   Lab Results  Component Value Date   TSH 3.100 03/10/2018    ==================================================  COVID-19 Education: The signs and symptoms of COVID-19 were discussed with the patient and how to seek care for testing (follow up with PCP or arrange E-visit).    I spent a total of 22 minutes with the patient spent in direct patient consultation.  Additional time spent with chart review  / charting (studies, outside notes, etc): 25 min Total Time: 47 min  Current medicines are reviewed at length with the patient today.  (+/- concerns) N/A  This visit occurred during the SARS-CoV-2 public health emergency.  Safety protocols were in place, including screening questions prior to the visit, additional usage of staff PPE, and extensive cleaning of exam room while observing appropriate contact time as indicated for disinfecting solutions.  Notice: This dictation was prepared with Dragon dictation along with smart phrase technology. Any transcriptional errors that result from this process are unintentional and may not be corrected upon review.   Studies Ordered:  Orders Placed This Encounter  Procedures   LONG TERM MONITOR (3-14 DAYS)   EKG 12-Lead   No orders of the defined types were placed in this encounter.   Patient Instructions / Medication Changes & Studies & Tests Ordered   Patient Instructions  Medication Instructions:  No changes  *If you need a refill on your cardiac medications before your next appointment, please call your pharmacy*   Lab Work: No labs needed    Testing/Procedures: Will be mailed to you  3 to 5 days Your physician has recommended that you wear a holter monitor 14 day . Holter monitors are medical devices that record the heart's electrical activity. Doctors most often use these monitors to  diagnose arrhythmias. Arrhythmias are problems with the speed or rhythm of the heartbeat. The monitor is a small, portable device. You can wear one while you do your normal daily activities. This is usually used to diagnose what is causing palpitations/syncope (passing out).    Follow-Up: At [facility], you and your health needs are  our priority.  As part of our continuing mission to provide you with exceptional heart care, we have created designated Provider Care Teams.  These Care Teams include your primary Cardiologist (physician) and Advanced Practice Providers (APPs -  Physician Assistants and Nurse Practitioners) who all work together to provide you with the care you need, when you need it.  We recommend signing up for the patient portal called "MyChart".  Sign up information is provided on this After Visit Summary.  MyChart is used to connect with patients for Virtual Visits (Telemedicine).  Patients are able to view lab/test results, encounter notes, upcoming appointments, etc.  Non-urgent messages can be sent to your provider as well.   To learn more about what you can do with MyChart, go to [email or web address].    Your next appointment:   2 month(s)  The format for your next appointment:   In Person  Provider:   Dr Ellyn Hack   If primary card or EP is not listed click here to update    :1}    Other Instructions ZIO XT- Long Term Monitor Instructions  Your physician has requested you wear a ZIO patch monitor for 14 days.  This is a single patch monitor. [employer] supplies one patch monitor per enrollment. Additional stickers are not available. Please do not apply patch if you will be having a Nuclear Stress Test,  Echocardiogram, Cardiac CT, MRI, or Chest Xray during the period you would be wearing the  monitor. The patch cannot be worn during these tests. You cannot remove and re-apply the  ZIO XT patch monitor.  Your ZIO patch monitor will be mailed 3 day USPS to your  address on file. It may take 3-5 days  to receive your monitor after you have been enrolled.  Once you have received your monitor, please review the enclosed instructions. Your monitor  has already been registered assigning a specific monitor serial # to you.  Billing and Patient Assistance Program Information  We have supplied [employer] with any of your insurance information on file for billing purposes. [employer] offers a sliding scale Patient Assistance Program for patients that do not have  insurance, or whose insurance does not completely cover the cost of the ZIO monitor.  You must apply for the Patient Assistance Program to qualify for this discounted rate.  To apply, please call [employer] at [phone], select option 4, select option 2, ask to apply for  Patient Assistance Program. Theodore Demark will ask your household income, and how many people  are in your household. They will quote your out-of-pocket cost based on that information.  [employer] will also be able to set up a 10-month, interest-free payment plan if needed.  Applying the monitor   Shave hair from upper left chest.  Hold abrader disc by orange tab. Rub abrader in 40 strokes over the upper left chest as  indicated in your monitor instructions.  Clean area with 4 enclosed alcohol pads. Let dry.  Apply patch as indicated in monitor instructions. Patch will be placed under collarbone on left  side of chest with arrow pointing upward.  Rub patch adhesive wings for 2 minutes. Remove white label marked "1". Remove the white  label marked "2". Rub patch adhesive wings for 2 additional minutes.  While looking in a mirror, press and release button in center of patch. A small green light will  flash 3-4 times. This will be your only indicator that the monitor has been turned on.  Do not shower  for the first 24 hours. You may shower after the first 24 hours.  Press the button if you feel a symptom. You will hear a small click. Record  Date, Time and  Symptom in the Patient Logbook.  When you are ready to remove the patch, follow instructions on the last 2 pages of Patient  Logbook. Stick patch monitor onto the last page of Patient Logbook.  Place Patient Logbook in the blue and white box. Use locking tab on box and tape box closed  securely. The blue and white box has prepaid postage on it. Please place it in the mailbox as  soon as possible. Your physician should have your test results approximately 7 days after the  monitor has been mailed back to [facility].  Call [facility] at [phone] if you have questions regarding  your ZIO XT patch monitor. Call them immediately if you see an orange light blinking on your  monitor.  If your monitor falls off in less than 4 days, contact our Monitor department at [phone].  If your monitor becomes loose or falls off after 4 days call [employer] at [phone] for  suggestions on securing your monitor    Important Information About Sugar           Glenetta Hew, M.D., M.S. Interventional Cardiologist   Pager # [phone] Phone # [phone] [address] [place], [place] [address]   Thank you for choosing [facility] at [facility]!!

## 2022-02-06 NOTE — Patient Instructions (Signed)
Medication Instructions:  No changes  *If you need a refill on your cardiac medications before your next appointment, please call your pharmacy*   Lab Work: No labs needed    Testing/Procedures: Will be mailed to you  3 to 5 days Your physician has recommended that you wear a holter monitor 14 day . Holter monitors are medical devices that record the heart's electrical activity. Doctors most often use these monitors to diagnose arrhythmias. Arrhythmias are problems with the speed or rhythm of the heartbeat. The monitor is a small, portable device. You can wear one while you do your normal daily activities. This is usually used to diagnose what is causing palpitations/syncope (passing out).    Follow-Up: At [facility], you and your health needs are our priority.  As part of our continuing mission to provide you with exceptional heart care, we have created designated Provider Care Teams.  These Care Teams include your primary Cardiologist (physician) and Advanced Practice Providers (APPs -  Physician Assistants and Nurse Practitioners) who all work together to provide you with the care you need, when you need it.  We recommend signing up for the patient portal called "MyChart".  Sign up information is provided on this After Visit Summary.  MyChart is used to connect with patients for Virtual Visits (Telemedicine).  Patients are able to view lab/test results, encounter notes, upcoming appointments, etc.  Non-urgent messages can be sent to your provider as well.   To learn more about what you can do with MyChart, go to [email or web address].    Your next appointment:   2 month(s)  The format for your next appointment:   In Person  Provider:   Dr Ellyn Hack   If primary card or EP is not listed click here to update    :1}    Other Instructions ZIO XT- Long Term Monitor Instructions  Your physician has requested you wear a ZIO patch monitor for 14 days.  This is a single patch  monitor. [employer] supplies one patch monitor per enrollment. Additional stickers are not available. Please do not apply patch if you will be having a Nuclear Stress Test,  Echocardiogram, Cardiac CT, MRI, or Chest Xray during the period you would be wearing the  monitor. The patch cannot be worn during these tests. You cannot remove and re-apply the  ZIO XT patch monitor.  Your ZIO patch monitor will be mailed 3 day USPS to your address on file. It may take 3-5 days  to receive your monitor after you have been enrolled.  Once you have received your monitor, please review the enclosed instructions. Your monitor  has already been registered assigning a specific monitor serial # to you.  Billing and Patient Assistance Program Information  We have supplied [employer] with any of your insurance information on file for billing purposes. [employer] offers a sliding scale Patient Assistance Program for patients that do not have  insurance, or whose insurance does not completely cover the cost of the ZIO monitor.  You must apply for the Patient Assistance Program to qualify for this discounted rate.  To apply, please call [employer] at [phone], select option 4, select option 2, ask to apply for  Patient Assistance Program. Theodore Demark will ask your household income, and how many people  are in your household. They will quote your out-of-pocket cost based on that information.  [employer] will also be able to set up a 27-month interest-free payment plan if needed.  Applying the monitor   Shave  hair from upper left chest.  Hold abrader disc by orange tab. Rub abrader in 40 strokes over the upper left chest as  indicated in your monitor instructions.  Clean area with 4 enclosed alcohol pads. Let dry.  Apply patch as indicated in monitor instructions. Patch will be placed under collarbone on left  side of chest with arrow pointing upward.  Rub patch adhesive wings for 2 minutes. Remove white label marked "1".  Remove the white  label marked "2". Rub patch adhesive wings for 2 additional minutes.  While looking in a mirror, press and release button in center of patch. A small green light will  flash 3-4 times. This will be your only indicator that the monitor has been turned on.  Do not shower for the first 24 hours. You may shower after the first 24 hours.  Press the button if you feel a symptom. You will hear a small click. Record Date, Time and  Symptom in the Patient Logbook.  When you are ready to remove the patch, follow instructions on the last 2 pages of Patient  Logbook. Stick patch monitor onto the last page of Patient Logbook.  Place Patient Logbook in the blue and white box. Use locking tab on box and tape box closed  securely. The blue and white box has prepaid postage on it. Please place it in the mailbox as  soon as possible. Your physician should have your test results approximately 7 days after the  monitor has been mailed back to [facility].  Call [place] at [phone] if you have questions regarding  your ZIO XT patch monitor. Call them immediately if you see an orange light blinking on your  monitor.  If your monitor falls off in less than 4 days, contact our Monitor department at [phone].  If your monitor becomes loose or falls off after 4 days call [employer] at [phone] for  suggestions on securing your monitor    Important Information About Sugar

## 2022-02-08 ENCOUNTER — Encounter: Payer: Self-pay | Admitting: Cardiology

## 2022-02-08 DIAGNOSIS — R079 Chest pain, unspecified: Secondary | ICD-10-CM | POA: Insufficient documentation

## 2022-02-08 NOTE — Assessment & Plan Note (Signed)
Somewhat atypical features of chest pain.  Difficult to get a story from her.  From what is described by the referring provider as chest pain walking on treadmill, this is somewhat concerning.  However, that did not seem to be the same symptom when I was asking the questions.  We may very well proceed with ischemic evaluation, but first I think we would like to get a determination of her PVC burden.  If indeed there are significant PVCs, then I think perhaps Coronary CTA would be a better option than a stress test (question her ability to comprehend and cooperate with stress test versus CTA.)  She does have notable cardiac risk factors of diabetes, hyperlipidemia and obesity (as well as former smoker).  Reassess symptoms at follow-up visit as well as after evaluation of the monitor.

## 2022-02-08 NOTE — Assessment & Plan Note (Signed)
Notable PVCs on EKG annual exam.  She does feel these off-and-on palpitations which seem to be more of a concern for her today actual chest pain symptoms.  I would like to see her PVC burden because this may change the trajectory of how we evaluate her.  Since she is not actively having significant chest pain I would like to first determine her PVC burden in order to determine whether we proceed with ischemic evaluation or not. Plan: 14-day Zio patch

## 2022-02-08 NOTE — Progress Notes (Signed)
  Subjective:  Patient ID: [name], female    DOB: 11-May-1956,  MRN: [id number]  [name] presents to clinic today for preventative diabetic foot care and painful elongated mycotic toenails 1-5 bilaterally which are tender when wearing enclosed shoe gear. Pain is relieved with periodic professional debridement.  Patient states blood glucose was 128 mg/dl today.  Last known HgA1c was 7.2%.  New problem(s): None.   PCP is Leonard Downing, MD , and last visit was May, 2023.  Allergies  Allergen Reactions   Codeine     agitation    Review of Systems: Negative except as noted in the HPI.  Objective: No changes noted in today's physical examination.  Objective: There were no vitals filed for this visit.  [name] is a pleasant 66 y.o. female in NAD. AAO X 3.  Vascular Examination: CFT immediate b/l LE. Palpable DP/PT pulses b/l LE. Digital hair sparse b/l. Skin temperature gradient WNL b/l. No pain with calf compression b/l. No edema noted b/l. No cyanosis or clubbing noted b/l LE.  Dermatological Examination: Pedal integument with normal turgor, texture and tone b/l LE. No open wounds b/l. No interdigital macerations b/l. Toenails 1-5 b/l elongated, thickened, discolored with subungual debris. +Tenderness with dorsal palpation of nailplates. No hyperkeratotic or porokeratotic lesions present.  Musculoskeletal Examination: Normal muscle strength 5/5 to all lower extremity muscle groups bilaterally. Pes planus deformity noted bilateral LE.Marland Kitchen No pain, crepitus or joint limitation noted with ROM b/l LE.  Patient ambulates independently without assistive aids.  Neurological Examination: Protective sensation intact 5/5 intact bilaterally with 10g monofilament b/l. Vibratory sensation intact b/l.  Assessment/Plan: 1. Pain due to onychomycosis of toenails of both feet   2. Controlled type 2 diabetes mellitus without complication, without long-term current use of  insulin ([place])     -Patient was evaluated and treated. All patient's and/or POA's questions/concerns answered on today's visit. -Continue foot and shoe inspections daily. Monitor blood glucose per PCP/Endocrinologist's recommendations. -Patient to continue soft, supportive shoe gear daily. -Toenails 1-5 b/l were debrided in length and girth with sterile nail nippers and dremel without iatrogenic bleeding.  -Patient/POA to call should there be question/concern in the interim.   Return in about 3 months (around 05/03/2022).  Marzetta Board, DPM

## 2022-02-08 NOTE — Assessment & Plan Note (Signed)
As noted, palpitations seem to be more the more concerning speech is of her symptoms.  14-day Zio patch

## 2022-02-08 NOTE — Assessment & Plan Note (Signed)
Blood pressure is well controlled.  She is actually not currently on any medications.

## 2022-02-08 NOTE — Assessment & Plan Note (Signed)
Obese.  She is on lots of medications, could consider the possibility of Ozempic or Mounjaro (GLP-1 1 agonist and diabetes with obesity)

## 2022-02-08 NOTE — Assessment & Plan Note (Signed)
A1c has been around 7.  She is on Farxiga Januvia, Lantus.  Would consider the possibility of Ozempic given her obesity.

## 2022-02-10 DIAGNOSIS — R002 Palpitations: Secondary | ICD-10-CM | POA: Diagnosis not present

## 2022-02-10 DIAGNOSIS — I493 Ventricular premature depolarization: Secondary | ICD-10-CM

## 2022-03-06 ENCOUNTER — Telehealth: Payer: Self-pay | Admitting: General Practice

## 2022-03-06 NOTE — Telephone Encounter (Signed)
[facility] called and left message on nurse voicemail line stating Dr Moshe Cipro wrote an order for nystatin powder for a rash but the rash is now gone and she no longer needs the medicine. They need an order faxed to be discontinued. Called them and informed staff I would fax over the prescription with discontinued written beside the order. They verbalized understanding.

## 2022-03-08 ENCOUNTER — Other Ambulatory Visit: Payer: Self-pay

## 2022-03-08 ENCOUNTER — Telehealth: Payer: Self-pay | Admitting: Cardiology

## 2022-03-08 DIAGNOSIS — I499 Cardiac arrhythmia, unspecified: Secondary | ICD-10-CM

## 2022-03-08 NOTE — Telephone Encounter (Signed)
Spoke with Vivien Rota '@[employer]'$  who says Margreta Journey already gave results to Cherylann Ratel RN

## 2022-03-08 NOTE — Telephone Encounter (Signed)
  Christina - [employer] calling to give result

## 2022-03-08 NOTE — Telephone Encounter (Signed)
Monitor read.  Lots of PVCs, high-grade AV block and pauses noted.  I agree with the plan for EP consult   [facility]

## 2022-03-08 NOTE — Telephone Encounter (Signed)
Received call from [place] from [facility] regarding a June 1st critical event. Spoke with patient's caregiver Tyron Russell who reported that patient has not had dizziness or lightheadedness. BP for 92/69 to 113/82, P 92. Dr. Margaretann Loveless (DOD) advised and showed her the rhythm from 02/15/22 at 7: 49 am. It showed high graded AV block. Dr. Margaretann Loveless ordered EP consultation. Order placed. Shalda informed of EP consult and to be waiting for a call to schedule patient.

## 2022-04-03 ENCOUNTER — Ambulatory Visit (INDEPENDENT_AMBULATORY_CARE_PROVIDER_SITE_OTHER): Payer: Medicare Other | Admitting: Internal Medicine

## 2022-04-03 ENCOUNTER — Encounter: Payer: Self-pay | Admitting: Internal Medicine

## 2022-04-03 VITALS — BP 100/56 | HR 83 | Ht 64.0 in | Wt 199.8 lb

## 2022-04-03 DIAGNOSIS — R002 Palpitations: Secondary | ICD-10-CM | POA: Diagnosis not present

## 2022-04-03 DIAGNOSIS — I493 Ventricular premature depolarization: Secondary | ICD-10-CM | POA: Diagnosis not present

## 2022-04-03 NOTE — Progress Notes (Signed)
ELECTROPHYSIOLOGY CONSULT NOTE  Patient ID: [name], MRN: [id number], DOB/AGE: Dec 14, 1955 66 y.o. Admit date: (Not on file) Date of Consult: 04/03/2022  Primary Physician: Leonard Downing, MD Primary Cardiologist: [facility]     [name] is a 66 y.o. female who is being seen today for the evaluation of PVCs  at the request of [facility].    HPI [name] is a 66 y.o. female referred for PVCs They were appreciated as a new irregular heart rhythm.  She has no palpitations.  She does have some exercise intolerance.  She has no nocturnal dyspnea or orthopnea.  Some peripheral edema.  No chest pain.  No syncope  Seen by Dr. Ellyn Hack, 5/23, PVCs noted at 30.7%  Intermittent high-grade heart block noted in the mid morning hours of 07 30 associate with a narrow QRS escape  ECG demonstrated right bundle inferior axis morphology and transition at V2-3; also present 1/19 and 2/16   Hypertension and diabetes  She lives in a group home because of "some mental issues "   Past Medical History:  Diagnosis Date   Anemia 09/25/2016   Bipolar affective disorder ([place]) 04/29/2012   Borderline personality disorder ([place])    Chronic back pain    Depression    Diabetes mellitus ([place]) 04/29/2012   Gait difficulty    GERD (gastroesophageal reflux disease)    HTN (hypertension) 04/29/2012   Hyperlipidemia    Hypothyroidism    Memory loss    Schizoaffective disorder ([place]) 04/29/2012   Schizophrenia ([place])       Surgical History:  Past Surgical History:  Procedure Laterality Date   APPENDECTOMY     CHOLECYSTECTOMY     COLONOSCOPY N/A 09/27/2016   Procedure: COLONOSCOPY;  Surgeon: Carol Ada, MD;  Location: [facility] ENDOSCOPY;  Service: Endoscopy;  Laterality: N/A;   ESOPHAGOGASTRODUODENOSCOPY N/A 09/26/2016   Procedure: ESOPHAGOGASTRODUODENOSCOPY (EGD);  Surgeon: Juanita Craver, MD;  Location: [facility] ENDOSCOPY;  Service: Endoscopy;  Laterality: N/A;   GIVENS CAPSULE STUDY N/A 09/27/2016    Procedure: GIVENS CAPSULE STUDY;  Surgeon: Carol Ada, MD;  Location: [place];  Service: Endoscopy;  Laterality: N/A;   KNEE SURGERY     right     Home Meds: Current Meds  Medication Sig   ACCU-CHEK FASTCLIX LANCETS MISC    ACCU-CHEK GUIDE test strip    acetaminophen (TYLENOL) 325 MG tablet Take 650 mg by mouth 3 (three) times daily.   ALLERGY RELIEF 10 MG tablet Take 10 mg by mouth daily.   allopurinol (ZYLOPRIM) 100 MG tablet Take 100 mg by mouth 2 (two) times daily.   atorvastatin (LIPITOR) 40 MG tablet Take 40 mg by mouth daily at 6 PM.   Blood Glucose Monitoring Suppl (ACCU-CHEK GUIDE) w/Device KIT    [employer] ANTACID PLUS ANTIGAS 400-400-40 MG/5ML suspension TAKE 30 ML BY MOUTH EVERY 6 HOURS AS NEEDED FOR GAS PAIN   divalproex (DEPAKOTE ER) 500 MG 24 hr tablet Take 1,000 mg by mouth at bedtime.    donepezil (ARICEPT) 10 MG tablet Take 1 tablet (10 mg total) by mouth daily. Future refills my be managed by PCP.   DROPSAFE SAFETY PEN NEEDLES 31G X 6 MM MISC    Ergocalciferol (VITAMIN D2 PO)    escitalopram (LEXAPRO) 20 MG tablet Take 20 mg by mouth every morning.    famotidine (PEPCID) 20 MG tablet Take 20 mg by mouth 2 (two) times daily.   FARXIGA 10 MG TABS tablet Take 10  mg by mouth daily.   Ferrous Sulfate (FEROSUL PO)    fluticasone (FLONASE) 50 MCG/ACT nasal spray Place 2 sprays into both nostrils daily.   GAVILAX 17 GM/SCOOP powder SMARTSIG:1 Capful(s) By Mouth Daily PRN   glipiZIDE (GLUCOTROL) 5 MG tablet Take 5 mg by mouth 2 (two) times daily.   JANUVIA 50 MG tablet Take 50 mg by mouth daily.   latanoprost (XALATAN) 0.005 % ophthalmic solution Place 1 drop into both eyes at bedtime.   levothyroxine (SYNTHROID, LEVOTHROID) 100 MCG tablet    LOKELMA 10 g PACK packet Take by mouth.   MOUNJARO 5 MG/0.5ML Pen Inject into the skin.   moxifloxacin (VIGAMOX) 0.5 % ophthalmic solution INSTILL 1 DROP INTO LEFTOEYE FOUR TIMES DAILY STARTING 2 DAYS PRIOR TO SURGERY AND 2 DROPS  MORNING OF SURGERY.   paliperidone (INVEGA SUSTENNA) 234 MG/1.5ML SUSP injection Inject 234 mg into the muscle every 30 (thirty) days. evry 2 -3 months   Paliperidone Palmitate (INVEGA SUSTENNA IM)    Polyethylene Glycol 3350 (CLEARLAX PO)    SENNA-TABS 8.6 MG tablet Take 2 tablets by mouth daily.   SITagliptin Phosphate (JANUVIA PO)    terconazole (TERAZOL 7) 0.4 % vaginal cream Place 1 applicator vaginally at bedtime. Use for seven days   traZODone (DESYREL) 100 MG tablet Take 100 mg by mouth at bedtime.    Vitamin D, Ergocalciferol, (DRISDOL) 1.25 MG (50000 UT) CAPS capsule Take 50,000 Units by mouth once a week.   [DISCONTINUED] LANTUS SOLOSTAR 100 UNIT/ML Solostar Pen Inject 10 Units into the skin at bedtime.    Allergies:  Allergies  Allergen Reactions   Codeine     agitation    Social History   Socioeconomic History   Marital status: Single    Spouse name: Not on file   Number of children: 0   Years of education: one year college   Highest education level: Not on file  Occupational History   Occupation: disabled  Tobacco Use   Smoking status: Former    Types: Cigarettes    Quit date: 2008    Years since quitting: 15.5   Smokeless tobacco: Never  Vaping Use   Vaping Use: Never used  Substance and Sexual Activity   Alcohol use: No    Comment: h/o use but none in years   Drug use: No    Comment: remote h/o drug use - cocaine, marijuana, uppers, downers, qualudes   Sexual activity: Never  Other Topics Concern   Not on file  Social History Narrative   Lives in [place] with five other ladies and staff.   Right-handed.   Occasional caffeine use.   Social Determinants of Health   Financial Resource Strain: Not on file  Food Insecurity: Not on file  Transportation Needs: Not on file  Physical Activity: Not on file  Stress: Not on file  Social Connections: Not on file  Intimate Partner Violence: Not on file     Family History  Problem  Relation Age of Onset   CVA Mother 43   Dementia Father 16   CAD Maternal Grandmother    Colon cancer Maternal Grandfather      ROS:  Please see the history of present illness.     All other systems reviewed and negative.    Physical Exam: Blood pressure (!) 100/56, pulse 83, height _0  (1.626 m), weight 199 lb 12.8 oz (90.6 kg), SpO2 95 %. General: Well developed, well nourished female in no acute  distress. Head: Normocephalic, atraumatic, sclera non-icteric, no xanthomas, nares are without discharge. EENT: normal  Lymph Nodes:  none Neck: Negative for carotid bruits. JVD not elevated. Back:without scoliosis kyphosis Lungs: Clear bilaterally to auscultation without wheezes, rales, or rhonchi. Breathing is unlabored. Heart: Irregularly irregular rate and rhythm no murmur . No rubs, or gallops appreciated. Abdomen: Soft, non-tender, non-distended with normoactive bowel sounds. No hepatomegaly. No rebound/guarding. No obvious abdominal masses. Msk:  Strength and tone appear normal for age. Extremities: No clubbing or cyanosis. No  edema.  Distal pedal pulses are 2+ and equal bilaterally. Skin: Warm and Dry Neuro: Alert and oriented X 3. CN III-XII intact Grossly normal sensory and motor function . Psych:  Responds to questions appropriately with a normal affect.        EKG: Sinus at 83 Intervals 19/09/38 PVCs left bundle inferior axis with intrinsicoid deflection of about 60 ms   Assessment and Plan:  PVCs unassociated with palpitations  Dyspnea  Intermittent high-grade heart block with narrow QRS conducted beats without change in PP interval  Schizoaffective disorder   The patient has 2 arrhythmic issues, the first are PVCs at high frequency.  Not clearly associated with any symptoms.  In the absence of palpitations associated, therapy would be informed by evidence of left ventricular dysfunction presumably secondary.  We will get an echocardiogram  In the event that  left ventricular dysfunction is noted, would need to assess for coronary disease prior to considering use of a 1C antiarrhythmic for PVC suppression.  She also had on a singular morning episodes of high-grade heart block with narrow QRS conduction and without PP prolongation.  In the absence of symptoms, I wonder if it is related to sleep apnea.  Hence, I do not think that pacing is indicated at this juncture.  It may be that loop recording will be important.  I am not sure about the reliability of her history although I suspect it is quite good.  Particularly if a antiarrhythmic drug were used for the PVCs, loop recording to clarify worsening or symptomatic heart block would be appropriate if there remains no clear indication for pacing     Virl Axe

## 2022-04-03 NOTE — Patient Instructions (Signed)
Medication Instructions:  Your physician recommends that you continue on your current medications as directed. Please refer to the Current Medication list given to you today.  *If you need a refill on your cardiac medications before your next appointment, please call your pharmacy*   Lab Work: BMET, CBC and Mg today  If you have labs (blood work) drawn today and your tests are completely normal, you will receive your results only by: [place] (if you have MyChart) OR A paper copy in the mail If you have any lab test that is abnormal or we need to change your treatment, we will call you to review the results.   Testing/Procedures: Your physician has requested that you have an echocardiogram. Echocardiography is a painless test that uses sound waves to create images of your heart. It provides your doctor with information about the size and shape of your heart and how well your heart's chambers and valves are working. This procedure takes approximately one hour. There are no restrictions for this procedure.    Follow-Up: At [facility], you and your health needs are our priority.  As part of our continuing mission to provide you with exceptional heart care, we have created designated Provider Care Teams.  These Care Teams include your primary Cardiologist (physician) and Advanced Practice Providers (APPs -  Physician Assistants and Nurse Practitioners) who all work together to provide you with the care you need, when you need it.  We recommend signing up for the patient portal called "MyChart".  Sign up information is provided on this After Visit Summary.  MyChart is used to connect with patients for Virtual Visits (Telemedicine).  Patients are able to view lab/test results, encounter notes, upcoming appointments, etc.  Non-urgent messages can be sent to your provider as well.   To learn more about what you can do with MyChart, go to [email or web address].    Your next appointment:    Follow up based on echo  Important Information About Sugar

## 2022-04-04 LAB — BASIC METABOLIC PANEL
BUN/Creatinine Ratio: 12 (ref 12–28)
BUN: 16 mg/dL (ref 8–27)
CO2: 25 mmol/L (ref 20–29)
Calcium: 9.8 mg/dL (ref 8.7–10.3)
Chloride: 99 mmol/L (ref 96–106)
Creatinine, Ser: 1.36 mg/dL — ABNORMAL HIGH (ref 0.57–1.00)
Glucose: 114 mg/dL — ABNORMAL HIGH (ref 70–99)
Potassium: 5.1 mmol/L (ref 3.5–5.2)
Sodium: 140 mmol/L (ref 134–144)
eGFR: 43 mL/min/{1.73_m2} — ABNORMAL LOW (ref >59)

## 2022-04-04 LAB — CBC
Hematocrit: 39.8 % (ref 34.0–46.6)
Hemoglobin: 13.3 g/dL (ref 11.1–15.9)
MCH: 30.9 pg (ref 26.6–33.0)
MCHC: 33.4 g/dL (ref 31.5–35.7)
MCV: 93 fL (ref 79–97)
Platelets: 239 10*3/uL (ref 150–450)
RBC: 4.3 x10E6/uL (ref 3.77–5.28)
RDW: 14.8 % (ref 11.7–15.4)
WBC: 7.4 10*3/uL (ref 3.4–10.8)

## 2022-04-04 LAB — MAGNESIUM: Magnesium: 2 mg/dL (ref 1.6–2.3)

## 2022-04-09 ENCOUNTER — Ambulatory Visit (INDEPENDENT_AMBULATORY_CARE_PROVIDER_SITE_OTHER): Payer: Medicare Other | Admitting: Cardiology

## 2022-04-09 ENCOUNTER — Encounter: Payer: Self-pay | Admitting: Cardiology

## 2022-04-09 VITALS — BP 115/74 | HR 72 | Ht 64.0 in | Wt 197.4 lb

## 2022-04-09 DIAGNOSIS — E785 Hyperlipidemia, unspecified: Secondary | ICD-10-CM

## 2022-04-09 DIAGNOSIS — E669 Obesity, unspecified: Secondary | ICD-10-CM

## 2022-04-09 DIAGNOSIS — R079 Chest pain, unspecified: Secondary | ICD-10-CM

## 2022-04-09 DIAGNOSIS — E1169 Type 2 diabetes mellitus with other specified complication: Secondary | ICD-10-CM

## 2022-04-09 DIAGNOSIS — I493 Ventricular premature depolarization: Secondary | ICD-10-CM

## 2022-04-09 DIAGNOSIS — I10 Essential (primary) hypertension: Secondary | ICD-10-CM

## 2022-04-09 DIAGNOSIS — R002 Palpitations: Secondary | ICD-10-CM

## 2022-04-09 DIAGNOSIS — I4430 Unspecified atrioventricular block: Secondary | ICD-10-CM

## 2022-04-09 NOTE — Progress Notes (Signed)
Primary Care Provider: Leonard Downing, MD Cardiologist: Glenetta Hew, MD Electrophysiologist: None  Clinic Note: Chief Complaint  Patient presents with   Follow-up    Monitor results   PVCs    31% PVCs noted on monitor.  Echo ordered but Dr. Caryl Comes from [place].  Completely asymptomatic.   Heart block    Episodes of heart block-high-grade Mobitz type II and complete heart block noted with up to 3.5-second pauses   ===================================  ASSESSMENT/PLAN   Problem List Items Addressed This Visit       Cardiology Problems   AV heart block (Chronic)    Asymptomatic episodes are happening during sleep hours or greater in the time that she wakes up.  Not having any syncope or near syncope.  Per Dr. Caryl Comes, if AAD's program used for PVCs, but need Linq recorder.      Hyperlipidemia associated with type 2 diabetes mellitus (HCC) (Chronic)    I do not have lipid results, but A1c was 6.8.  She is on atorvastatin 40 mg daily along with Celene Skeen, Januvia, and glipizide.  Defer management to PCP.      HTN (hypertension) (Chronic)    Blood pressure looks good.  Not currently on any medications.      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  My information would be to go forward with an ischemic evaluation, Dr. Caryl Comes only ordered a 2D echo.  Further evaluation based on the results of the echocardiogram.  We will defer to Dr. Caryl Comes.  She is pretty asymptomatic,       Relevant Orders   EKG 12-Lead (Completed)     Other   Chest pain of uncertain etiology    Atypical features.  Determine extent of possible ischemic evaluation based on echo results.  Very rare and atypical symptoms.      Obesity (BMI 30-39.9)    She is lost some weight.  She is now on [place] for diabetes which could be contributing.       ===================================  HPI:    [name] is a 66 y.o. female with a PMH below who presents today for 64-monthfollow-up to discuss results of monitor. She returns at the request of ELeonard Downing *.  I saw Connie OFFENBERGERon 02/06/2022 for initial consultation for evaluation of palpitations/PVCs and atypical chest pain.  Plan was to start off with Zio patch monitor to evaluate PVC burden which would then help uKore[place]ermine need for ischemic evaluation of atypical chest pain as well as potential Echo.  Also suggested Ozempic for her diabetes given obesity at baseline.  Recent Hospitalizations: None  Seen by Dr. SVirl Axeon July 18 for EP Consultation-evaluation of frequent PVCs (30.7% PVCs along with intermittent high-grade AV block in midmorning hours with narrow QRS escape beats--consult placed but DOD upon initial evaluation of monitor).  She denies any palpitations but does have exertional intolerance.  Some peripheral edema but no PND orthopnea.  No syncope or near syncope. => 2D Echo Ordered If there is evidence of LV dysfunction, would need ischemic evaluation, especially if there is consideration of 1C AAD for PVC suppression. In absence of symptoms, intermittent high-grade AV block with narrow QRS complex conduction-probably related to possible OSA.  => Did not feel PPM indicated.  Could consider ILR (Linq) given lack of reliability of her history. If AADs are considered for PVC suppression,  LINQ ILR would be indicated to clarify worsening or symptomatic CHB.  Reviewed  CV studies:    The following studies were reviewed today: (if available, images/films reviewed: From Epic Chart or Care Everywhere) 14-day Zio patch (5/27-6/06/2022): Predominant underlying rhythm = Sinus Rhythm: HR range 40-139 bpm with average 77 bpm.  Rare isolated PACs with occasional couplets.  Frequent (~31%) isolated PVCs with rare couplets and triplets and notable bigeminy/trigeminy.  2 runs of either VT or aberrantly conducted PAT: Fastest and  longest was 8 beats at a max HR 132 bpm 1 PAT run 5 beats max rate 113 bpm.  2 "pauses "with actually high-grade AVB lasting 3 to 3.5 seconds.  Total of 6 episodes of high-grade AVB (some Mobitz type II and some CHB) lasting total of 40 seconds.   Interval History:   [name] presents for 64-monthfollow-up to discuss results of her monitor which have already been acted upon.  I asked her about her sleep habits and she indicates that she is usually awake by 7:45 AM but sometimes can sleep late.  It would appear that the pauses/high-grade AVB episodes probably do occur near the end of her sleep as they are occurring in their late at night but occasionally at 7 in the morning.  .  She says she occasionally feels her heart rate go up for for shoulder burst but has not really noted a little dizzines with fast heart rate.  She has not had any syncope or near syncopal episodes.  Otherwise stable cardiac standpoint.  No chest pain or pressure with rest or exertion.  No PND, orthopnea with only trivial end of day edema.  We reviewed the results of her monitor again and I explained to her the reason for the echocardiogram that was ordered.  REVIEWED OF SYSTEMS   Review of Systems  Constitutional:  Positive for weight loss (She says she is probably eating better.). Negative for malaise/fatigue.  HENT:  Negative for nosebleeds.   Respiratory:  Positive for cough (Occasional). Negative for shortness of breath.   Cardiovascular:        Per HPI  Gastrointestinal:  Negative for blood in stool and melena.  Genitourinary:  Negative for flank pain and hematuria.  Musculoskeletal:  Positive for joint pain (Shoulder) and neck pain. Negative for back pain.  Neurological:  Negative for dizziness, sensory change, speech change and focal weakness.  Psychiatric/Behavioral:  Positive for memory loss. Negative for hallucinations. The patient is not nervous/anxious (Not currently.).        Relatively poor  historian.    I have reviewed and (if needed) personally updated the patient's problem list, medications, allergies, past medical and surgical history, social and family history.   PAST MEDICAL HISTORY   Past Medical History:  Diagnosis Date   Anemia 09/25/2016   Bipolar affective disorder ([place] [address] 04/29/2012   Borderline personality disorder ([place]    Chronic back pain    Depression    Diabetes mellitus ([place] 04/29/2012   Gait difficulty    GERD (gastroesophageal reflux disease)    HTN (hypertension) 04/29/2012   Hyperlipidemia    Hypothyroidism    Memory loss    Schizoaffective disorder ([place] 04/29/2012   Schizophrenia ([place]     PAST SURGICAL HISTORY   Past Surgical History:  Procedure Laterality Date   APPENDECTOMY     CHOLECYSTECTOMY     COLONOSCOPY N/A 09/27/2016   Procedure: COLONOSCOPY;  Surgeon: PCarol Ada MD;  [place]  Service: [place];  Laterality: N/A;   ESOPHAGOGASTRODUODENOSCOPY N/A 09/26/2016   Procedure: ESOPHAGOGASTRODUODENOSCOPY (EGD);  Surgeon: Juanita Craver, MD;  Location: [facility] END[facility]COPY;  Service: Endoscopy;  Laterality: N/A;   GIVENS CAPSULE STUDY N/A 09/27/2016   Procedure: GIVENS CAPSULE STUDY;  Surgeon: Carol Ada, MD;  Location: [place];  Service: Endoscopy;  Laterality: N/A;   KNEE SURGERY     right    Immunization History  Administered Date(s) Administered   Influenza,inj,Quad PF,6+ Mos 07/17/2019   Influenza-Unspecified 05/18/2014, 09/07/2019    MEDICATIONS/ALLERGIES   Current Meds  Medication Sig   ACCU-CHEK FASTCLIX LANCETS MISC    ACCU-CHEK GUIDE test strip    acetaminophen (TYLENOL) 325 MG tablet Take 650 mg by mouth 3 (three) times daily.   ALLERGY RELIEF 10 MG tablet Take 10 mg by mouth daily.   allopurinol (ZYLOPRIM) 100 MG tablet Take 100 mg by mouth 2 (two) times daily.   atorvastatin (LIPITOR) 40 MG tablet Take 40 mg by mouth daily at 6 PM.   Blood Glucose Monitoring Suppl (ACCU-CHEK GUIDE) w/Device KIT     [employer] ANTACID PLUS ANTIGAS 400-400-40 MG/5ML suspension TAKE 30 ML BY MOUTH EVERY 6 HOURS AS NEEDED FOR GAS PAIN   divalproex (DEPAKOTE ER) 500 MG 24 hr tablet Take 1,000 mg by mouth at bedtime.    donepezil (ARICEPT) 10 MG tablet Take 1 tablet (10 mg total) by mouth daily. Future refills my be managed by PCP.   DROPSAFE SAFETY PEN NEEDLES 31G X 6 MM MISC    Ergocalciferol (VITAMIN D2 PO)    escitalopram (LEXAPRO) 20 MG tablet Take 20 mg by mouth every morning.    famotidine (PEPCID) 20 MG tablet Take 20 mg by mouth 2 (two) times daily.   FARXIGA 10 MG TABS tablet Take 10 mg by mouth daily.   Ferrous Sulfate (FEROSUL PO)    fluticasone (FLONASE) 50 MCG/ACT nasal spray Place 2 sprays into both nostrils daily.   GAVILAX 17 GM/SCOOP powder SMARTSIG:1 Capful(s) By Mouth Daily PRN   glipiZIDE (GLUCOTROL) 5 MG tablet Take 5 mg by mouth 2 (two) times daily.   JANUVIA 50 MG tablet Take 50 mg by mouth daily.   lansoprazole (PREVACID) 15 MG capsule Take 1 capsule (15 mg total) by mouth daily at 12 noon.   latanoprost (XALATAN) 0.005 % ophthalmic solution Place 1 drop into both eyes at bedtime.   levothyroxine (SYNTHROID, LEVOTHROID) 100 MCG tablet    LOKELMA 10 g PACK packet Take by mouth.   MOUNJARO 5 MG/0.5ML Pen Inject into the skin.   moxifloxacin (VIGAMOX) 0.5 % ophthalmic solution INSTILL 1 DROP INTO LEFTOEYE FOUR TIMES DAILY STARTING 2 DAYS PRIOR TO SURGERY AND 2 DROPS MORNING OF SURGERY.   paliperidone (INVEGA SUSTENNA) 234 MG/1.5ML SUSP injection Inject 234 mg into the muscle every 30 (thirty) days. evry 2 -3 months   Paliperidone Palmitate (INVEGA SUSTENNA IM)    pantoprazole (PROTONIX) 20 MG tablet Take 20 mg by mouth daily.   Polyethylene Glycol 3350 (CLEARLAX PO)    SENNA-TABS 8.6 MG tablet Take 2 tablets by mouth daily.   SITagliptin Phosphate (JANUVIA PO)    terconazole (TERAZOL 7) 0.4 % vaginal cream Place 1 applicator vaginally at bedtime. Use for seven days   traZODone (DESYREL) 100  MG tablet Take 100 mg by mouth at bedtime.    Vitamin D, Ergocalciferol, (DRISDOL) 1.25 MG (50000 UT) CAPS capsule Take 50,000 Units by mouth once a week.    Allergies  Allergen Reactions   Codeine  agitation    SOCIAL HISTORY/FAMILY HISTORY   Reviewed in Epic:  Pertinent findings:  Social History   Tobacco Use   Smoking status: Former    Types: Cigarettes    Quit date: 2008    Years since quitting: 15.5   Smokeless tobacco: Never  Vaping Use   Vaping Use: Never used  Substance Use Topics   Alcohol use: No    Comment: h/o use but none in years   Drug use: No    Comment: remote h/o drug use - cocaine, marijuana, uppers, downers, qualudes   Social History   Social History Narrative   Lives in [place] with five other ladies and staff.   Right-handed.   Occasional caffeine use.    OBJCTIVE -PE, EKG, labs   Wt Readings from Last 3 Encounters:  04/09/22 197 lb 6.4 oz (89.5 kg)  04/03/22 199 lb 12.8 oz (90.6 kg)  02/06/22 214 lb 3.2 oz (97.2 kg)    Physical Exam: BP 115/74   Pulse 72   Ht _0  (1.626 m)   Wt 197 lb 6.4 oz (89.5 kg)   SpO2 97%   BMI 33.88 kg/m  Physical Exam Constitutional:      General: She is not in acute distress.    Appearance: Normal appearance. She is obese.  HENT:     Head: Normocephalic and atraumatic.  Neck:     Vascular: No carotid bruit or JVD.  Cardiovascular:     Rate and Rhythm: Normal rate and regular rhythm. Occasional Extrasystoles are present.    Chest Wall: PMI is not displaced.     Pulses: Normal pulses.     Heart sounds: Normal heart sounds, S1 normal and S2 normal. No murmur heard.    No friction rub. No gallop.  Pulmonary:     Effort: Pulmonary effort is normal. No respiratory distress.     Breath sounds: Normal breath sounds.  Musculoskeletal:        General: No swelling.     Cervical back: Normal range of motion and neck supple.  Neurological:     Mental Status: She is alert and  oriented to person, place, and time. Mental status is at baseline.     Comments: Poor historian.  Cognitive delay  Psychiatric:     Comments: Blunted/flat affect.  Normal mood.  Poor apprehension and cognition.     Adult ECG Report N/a  Recent Labs: Reviewed No results found for: "CHOL", "HDL", "LDLCALC", "LDLDIRECT", "TRIG", "CHOLHDL" Lab Results  Component Value Date   CREATININE 1.36 (H) 04/03/2022   BUN 16 04/03/2022   NA 140 04/03/2022   K 5.1 04/03/2022   CL 99 04/03/2022   CO2 25 04/03/2022      Latest Ref Rng & Units 04/03/2022    3:43 PM 03/10/2018    2:56 PM 10/16/2017    7:54 PM  CBC  WBC 3.4 - 10.8 x10E3/uL 7.4  7.0  7.0   Hemoglobin 11.1 - 15.9 g/dL 13.3  11.5  11.6   Hematocrit 34.0 - 46.6 % 39.8  34.0  35.0   Platelets 150 - 450 x10E3/uL 239  343  340     Lab Results  Component Value Date   HGBA1C 6.8 (H) 03/10/2018   Lab Results  Component Value Date   TSH 3.100 03/10/2018    ================================================== I spent a total of 22 minutes with the patient spent in direct patient consultation.  Additional time spent with chart review  /  charting (studies, outside notes, etc): 19 min Total Time: 41 min  Current medicines are reviewed at length with the patient today.  (+/- concerns) N/A  Notice: This dictation was prepared with Dragon dictation along with smart phrase technology. Any transcriptional errors that result from this process are unintentional and may not be corrected upon review.  Studies Ordered:   Orders Placed This Encounter  Procedures   EKG 12-Lead   No orders of the defined types were placed in this encounter.   Patient Instructions / Medication Changes & Studies & Tests Ordered   Patient Instructions  Medication Instructions:   No changes   *If you need a refill on your cardiac medications before your next appointment, please call your pharmacy*   Lab Work: No changes    Testing/Procedures: Not  needed   Follow-Up: At [facility], you and your health needs are our priority.  As part of our continuing mission to provide you with exceptional heart care, we have created designated Provider Care Teams.  These Care Teams include your primary Cardiologist (physician) and Advanced Practice Providers (APPs -  Physician Assistants and Nurse Practitioners) who all work together to provide you with the care you need, when you need it.  We recommend signing up for the patient portal called "MyChart".  Sign up information is provided on this After Visit Summary.  MyChart is used to connect with patients for Virtual Visits (Telemedicine).  Patients are able to view lab/test results, encounter notes, upcoming appointments, etc.  Non-urgent messages can be sent to your provider as well.   To learn more about what you can do with MyChart, go to [email or web address].    Your next appointment:   6 month(s)  The format for your next appointment:   In Person  Provider:   Glenetta Hew, MD     Other Instructions  Keep appointment wit Dr Lovena Le   Call in Sept  2023 for a Jan 2024 appointment with Dr Ellyn Hack   Important Information About Sugar           Glenetta Hew, M.D., M.S. Interventional Cardiologist   Pager # [phone] Phone # [phone] [address] [place], [place] [address]   Thank you for choosing Heartcare at [facility]!!

## 2022-04-09 NOTE — Patient Instructions (Signed)
Medication Instructions:   No changes   *If you need a refill on your cardiac medications before your next appointment, please call your pharmacy*   Lab Work: No changes    Testing/Procedures: Not needed   Follow-Up: At [facility], you and your health needs are our priority.  As part of our continuing mission to provide you with exceptional heart care, we have created designated Provider Care Teams.  These Care Teams include your primary Cardiologist (physician) and Advanced Practice Providers (APPs -  Physician Assistants and Nurse Practitioners) who all work together to provide you with the care you need, when you need it.  We recommend signing up for the patient portal called "MyChart".  Sign up information is provided on this After Visit Summary.  MyChart is used to connect with patients for Virtual Visits (Telemedicine).  Patients are able to view lab/test results, encounter notes, upcoming appointments, etc.  Non-urgent messages can be sent to your provider as well.   To learn more about what you can do with MyChart, go to [email or web address].    Your next appointment:   6 month(s)  The format for your next appointment:   In Person  Provider:   Glenetta Hew, MD     Other Instructions  Keep appointment wit Dr Lovena Le   Call in Sept  2023 for a Jan 2024 appointment with Dr Ellyn Hack   Important Information About Sugar

## 2022-04-10 ENCOUNTER — Telehealth: Payer: Self-pay

## 2022-04-10 NOTE — Telephone Encounter (Signed)
-----   Message from Deboraha Sprang, MD sent at 04/07/2022  3:17 PM EDT ----- Please Inform Patient  Labs are normal x mild decrease in renal function although it is better than it was a few years ago, but not as good as it was in 2019   she should followup this with her PCP   Thanks

## 2022-04-10 NOTE — Telephone Encounter (Signed)
Spoke with pt and advised per Dr Caryl Comes labs are normal with exception of mild kidney dysfunction and recommendation for pt to have PCP continue to follow.  Pt verbalizes understanding and thanked Therapist, sports for the phone call.

## 2022-04-15 ENCOUNTER — Encounter: Payer: Self-pay | Admitting: Cardiology

## 2022-04-15 NOTE — Assessment & Plan Note (Signed)
She is lost some weight.  She is now on [place] for diabetes which could be contributing.

## 2022-04-15 NOTE — Assessment & Plan Note (Signed)
Blood pressure looks good.  Not currently on any medications.

## 2022-04-15 NOTE — Assessment & Plan Note (Signed)
I do not have lipid results, but A1c was 6.8.  She is on atorvastatin 40 mg daily along with Celene Skeen, Januvia, and glipizide.  Defer management to PCP.

## 2022-04-15 NOTE — Assessment & Plan Note (Signed)
Atypical features.  Determine extent of possible ischemic evaluation based on echo results.  Very rare and atypical symptoms.

## 2022-04-15 NOTE — Assessment & Plan Note (Signed)
Normal activity on exam and 31% noted on monitor.  My information would be to go forward with an ischemic evaluation, Dr. Caryl Comes only ordered a 2D echo.  Further evaluation based on the results of the echocardiogram.  We will defer to Dr. Caryl Comes.  She is pretty asymptomatic,

## 2022-04-15 NOTE — Assessment & Plan Note (Signed)
Asymptomatic episodes are happening during sleep hours or greater in the time that she wakes up.  Not having any syncope or near syncope.  Per Dr. Caryl Comes, if AAD's program used for PVCs, but need Linq recorder.

## 2022-04-16 ENCOUNTER — Ambulatory Visit (HOSPITAL_COMMUNITY): Payer: Medicare Other | Attending: Cardiology

## 2022-04-16 DIAGNOSIS — R002 Palpitations: Secondary | ICD-10-CM | POA: Diagnosis present

## 2022-04-16 DIAGNOSIS — E039 Hypothyroidism, unspecified: Secondary | ICD-10-CM | POA: Diagnosis not present

## 2022-04-16 DIAGNOSIS — I340 Nonrheumatic mitral (valve) insufficiency: Secondary | ICD-10-CM | POA: Diagnosis not present

## 2022-04-16 DIAGNOSIS — I10 Essential (primary) hypertension: Secondary | ICD-10-CM | POA: Insufficient documentation

## 2022-04-16 DIAGNOSIS — I493 Ventricular premature depolarization: Secondary | ICD-10-CM | POA: Insufficient documentation

## 2022-04-16 DIAGNOSIS — E785 Hyperlipidemia, unspecified: Secondary | ICD-10-CM | POA: Diagnosis not present

## 2022-04-16 DIAGNOSIS — E119 Type 2 diabetes mellitus without complications: Secondary | ICD-10-CM | POA: Diagnosis not present

## 2022-04-16 LAB — ECHOCARDIOGRAM COMPLETE
Area-P 1/2: 3.5 cm2
S' Lateral: 2.6 cm

## 2022-04-30 IMAGING — DX DG CHEST 2V
2 series · 2 of 2 positions shown · non-contrast
Comparison: October 16, 2017

CLINICAL DATA: Chronic cough.

EXAM:
CHEST - 2 VIEW

[chest pa]
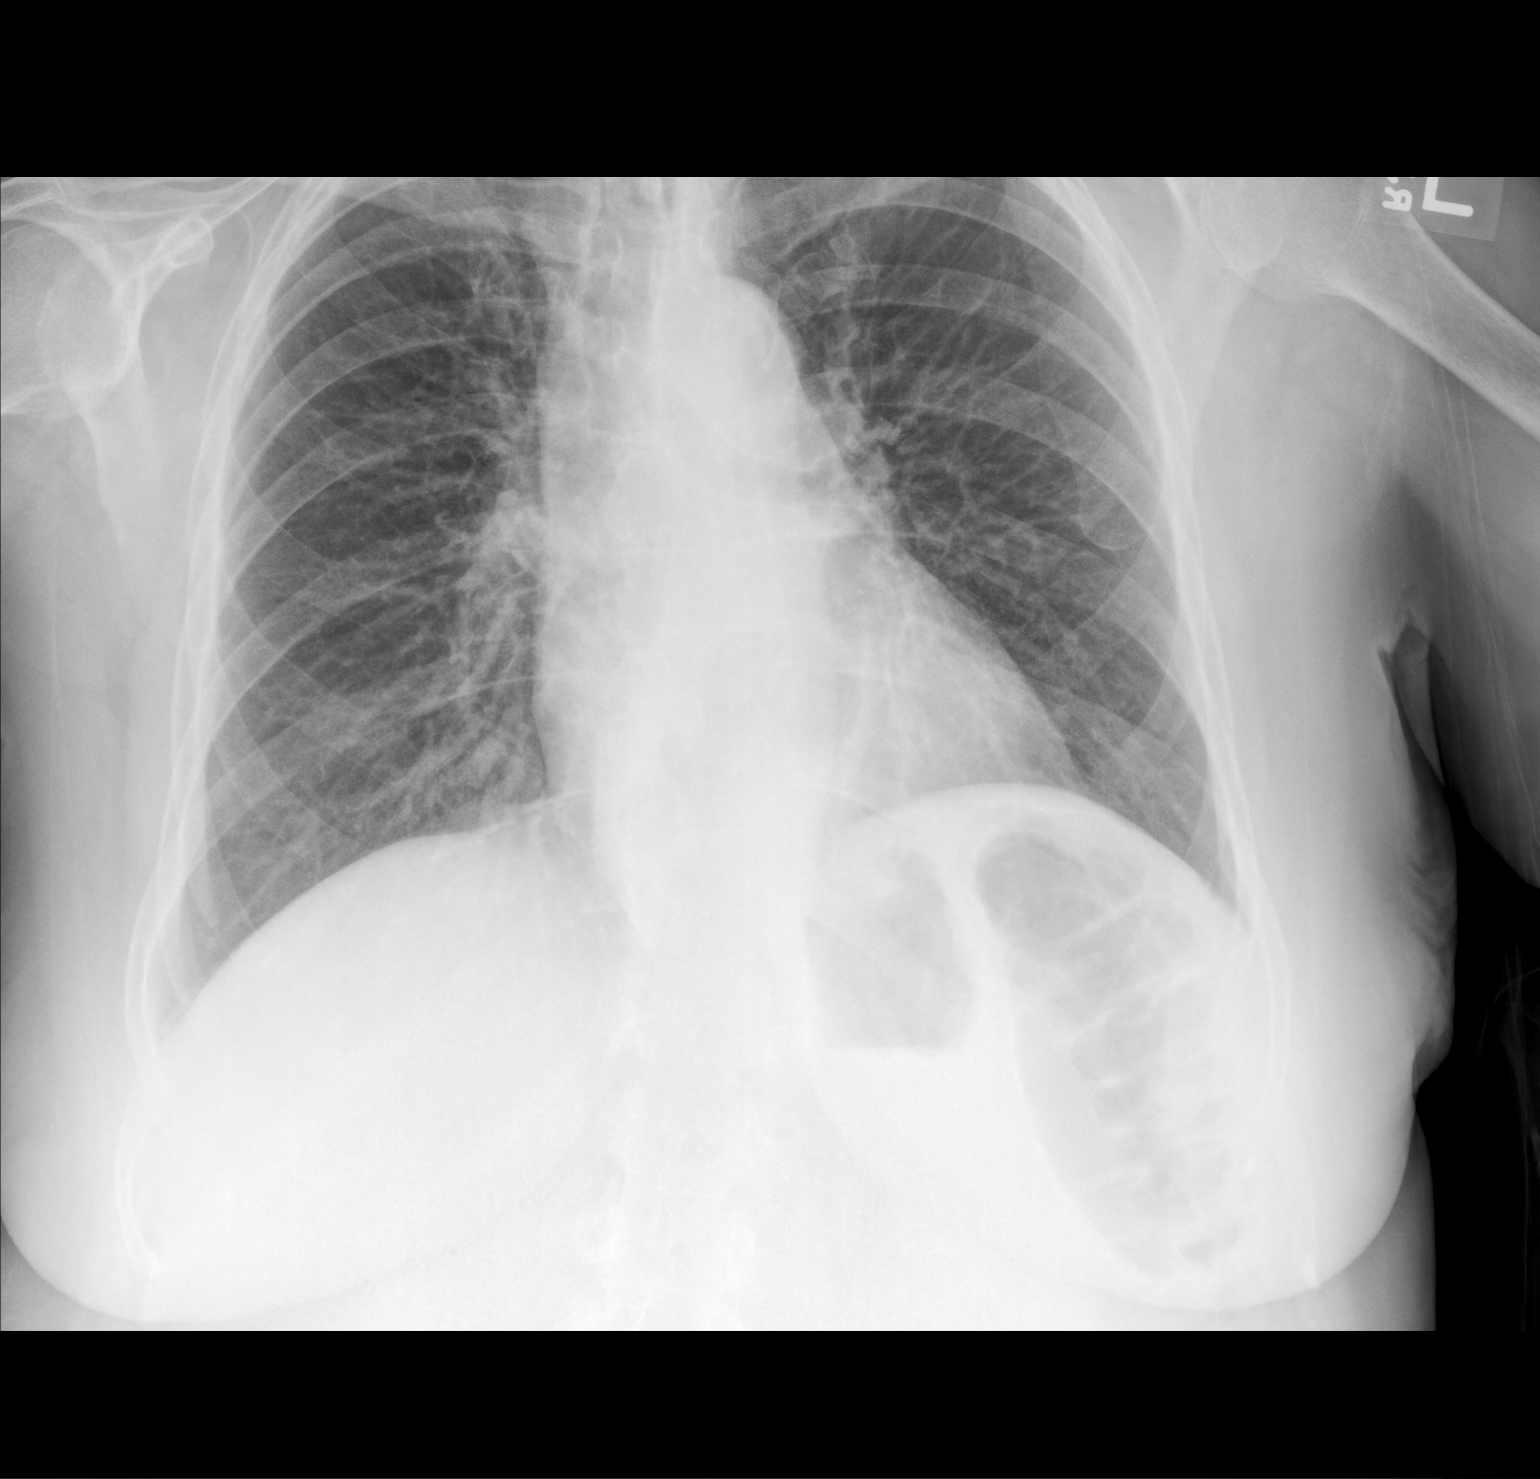

[chest lat]
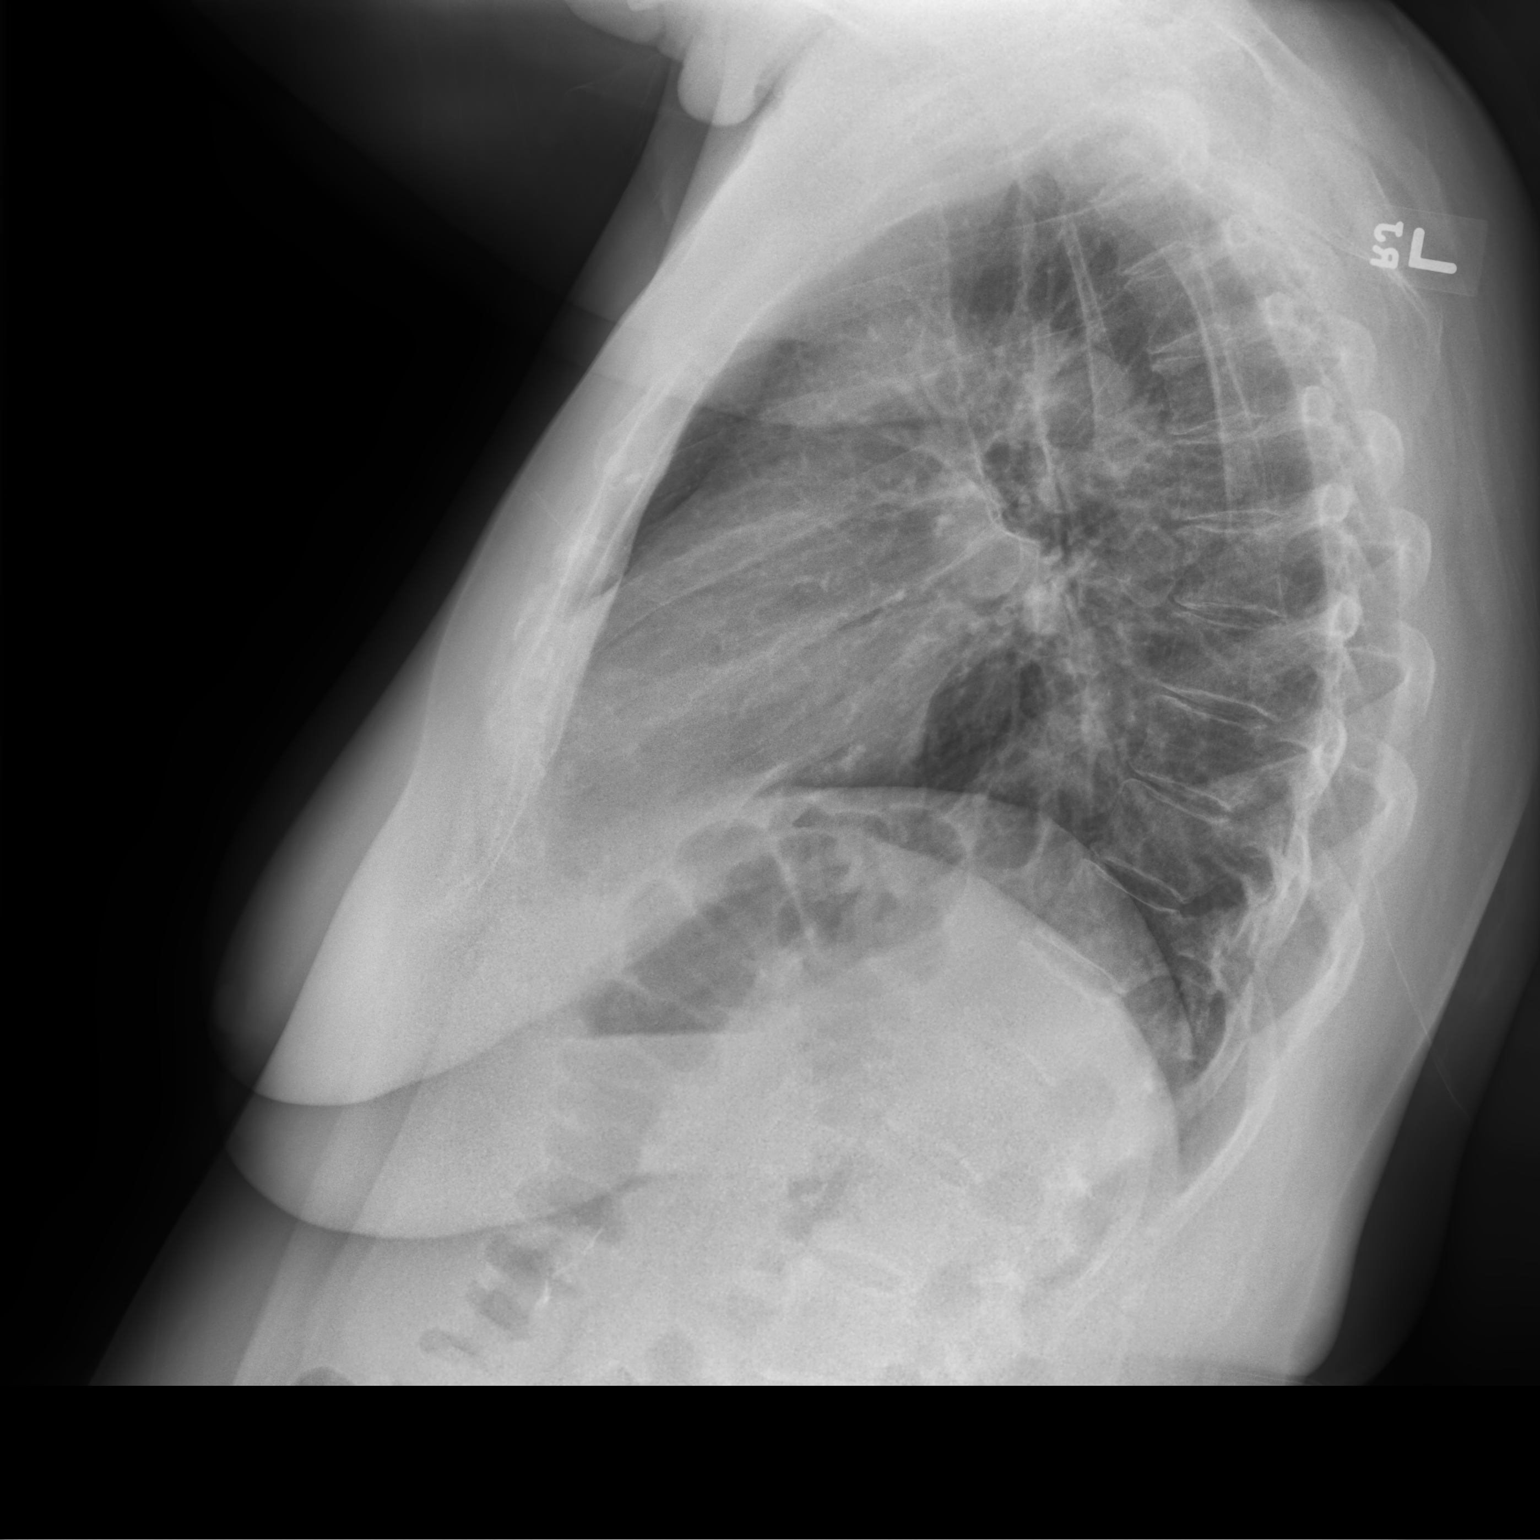

[2 of 2 positions shown; findings below may reference images not displayed]

FINDINGS: Mild diffusely increased interstitial lung markings are seen. This
is slightly more prominent severity when compared to the prior exam.
There is no evidence of focal consolidation, pleural effusion or
pneumothorax. The heart size and mediastinal contours are within
normal limits. There is moderate severity calcification of the
aortic arch. The visualized skeletal structures are unremarkable.
IMPRESSION: Mild diffusely increased interstitial lung markings which are
likely, in part, chronic in nature.

## 2022-05-09 ENCOUNTER — Ambulatory Visit (INDEPENDENT_AMBULATORY_CARE_PROVIDER_SITE_OTHER): Payer: Medicare Other | Admitting: Podiatry

## 2022-05-09 ENCOUNTER — Encounter: Payer: Self-pay | Admitting: Podiatry

## 2022-05-09 DIAGNOSIS — M79675 Pain in left toe(s): Secondary | ICD-10-CM | POA: Diagnosis not present

## 2022-05-09 DIAGNOSIS — B351 Tinea unguium: Secondary | ICD-10-CM

## 2022-05-09 DIAGNOSIS — M79674 Pain in right toe(s): Secondary | ICD-10-CM | POA: Diagnosis not present

## 2022-05-09 DIAGNOSIS — E119 Type 2 diabetes mellitus without complications: Secondary | ICD-10-CM | POA: Diagnosis not present

## 2022-05-16 NOTE — Progress Notes (Signed)
  Subjective:  Patient ID: [name], female    DOB: 09/13/1956,  MRN: [id number]  [name] presents to clinic today for preventative diabetic foot care and painful thick toenails that are difficult to trim. Pain interferes with ambulation. Aggravating factors include wearing enclosed shoe gear. Pain is relieved with periodic professional debridement.  Patient states blood glucose was 127 mg/dl today. Last known HgA1c was unknown.  PCP is Leonard Downing, MD , and last visit was July, 2023.  Allergies  Allergen Reactions   Codeine     agitation    Review of Systems: Negative except as noted in the HPI.  Objective: No changes noted in today's physical examination.  Vascular Examination: Palpable pedal pulses b/l LE. Digital hair present b/l. No pedal edema b/l. Skin temperature gradient WNL b/l. No varicosities b/l. Marland Kitchen  Dermatological Examination: Pedal skin with normal turgor, texture and tone b/l. No open wounds. No interdigital macerations b/l. Toenails 1-5 b/l thickened, discolored, dystrophic with subungual debris. There is pain on palpation to dorsal aspect of nailplates. .  Neurological Examination: Protective sensation intact with 10 gram monofilament b/l LE. Vibratory sensation intact b/l LE.   Musculoskeletal Examination: Muscle strength 5/5 to all LE muscle groups b/l. Pes planus deformity noted bilateral LE. Patient ambulates independent of any assistive aids.  Assessment/Plan: 1. Pain due to onychomycosis of toenails of both feet   2. Controlled type 2 diabetes mellitus without complication, without long-term current use of insulin ([place])   -Examined patient. -Continue foot and shoe inspections daily. Monitor blood glucose per PCP/Endocrinologist's recommendations. -Mycotic toenails 1-5 bilaterally were debrided in length and girth with sterile nail nippers and dremel without incident. -Patient/POA to call should there be question/concern in the  interim.   Return in about 3 months (around 08/09/2022).  Marzetta Board, DPM

## 2022-08-28 ENCOUNTER — Ambulatory Visit (INDEPENDENT_AMBULATORY_CARE_PROVIDER_SITE_OTHER): Payer: Medicare Other | Admitting: Podiatry

## 2022-08-28 VITALS — BP 119/68

## 2022-08-28 DIAGNOSIS — M79674 Pain in right toe(s): Secondary | ICD-10-CM

## 2022-08-28 DIAGNOSIS — M79675 Pain in left toe(s): Secondary | ICD-10-CM

## 2022-08-28 DIAGNOSIS — B351 Tinea unguium: Secondary | ICD-10-CM | POA: Diagnosis not present

## 2022-08-28 DIAGNOSIS — E119 Type 2 diabetes mellitus without complications: Secondary | ICD-10-CM | POA: Diagnosis not present

## 2022-08-28 NOTE — Progress Notes (Signed)
  Subjective:  Patient ID: [name], female    DOB: Mar 31, 1956,  MRN: [id number]  NATURI ALARID presents to clinic today for preventative diabetic foot care and painful elongated mycotic toenails 1-5 bilaterally which are tender when wearing enclosed shoe gear. Pain is relieved with periodic professional debridement.  Chief Complaint  Patient presents with   Nail Problem    [facility] BS-[id number] yesterday A1C-6.7 PCP-[place], [place] PCP VST- 1 or 2 months ago   New problem(s): None.   PCP is Leonard Downing, MD.  Allergies  Allergen Reactions   Codeine     agitation   Review of Systems: Negative except as noted in the HPI.  Objective: No changes noted in today's physical examination. Vitals:   08/28/22 1408  BP: 119/68   [name] is a pleasant 66 y.o. female WD, WN in NAD. AAO x 3.  Vascular Examination: Palpable pedal pulses b/l LE. Digital hair present b/l. No pedal edema b/l. Skin temperature gradient WNL b/l. No varicosities b/l. Marland Kitchen  Dermatological Examination: Pedal skin with normal turgor, texture and tone b/l. No open wounds. No interdigital macerations b/l. Toenails 1-5 b/l thickened, discolored, dystrophic with subungual debris. There is pain on palpation to dorsal aspect of nailplates. .  Neurological Examination: Protective sensation intact with 10 gram monofilament b/l LE. Vibratory sensation intact b/l LE.   Musculoskeletal Examination: Muscle strength 5/5 to all LE muscle groups b/l. Pes planus deformity noted bilateral LE. Patient ambulates independent of any assistive aids.  Assessment/Plan: 1. Pain due to onychomycosis of toenails of both feet   2. Controlled type 2 diabetes mellitus without complication, without long-term current use of insulin ([place])     No orders of the defined types were placed in this encounter.   -Patient was evaluated and treated. All patient's and/or POA's questions/concerns answered on today's visit. -Continue diabetic  foot care principles: inspect feet daily, monitor glucose as recommended by PCP and/or Endocrinologist, and follow prescribed diet per PCP, Endocrinologist and/or dietician. -Continue supportive shoe gear daily. -Toenails 1-5 b/l were debrided in length and girth with sterile nail nippers and dremel without iatrogenic bleeding.  -Patient/POA to call should there be question/concern in the interim.   Return in about 3 months (around 11/27/2022).  Marzetta Board, DPM

## 2022-09-02 ENCOUNTER — Encounter: Payer: Self-pay | Admitting: Podiatry

## 2022-11-30 ENCOUNTER — Other Ambulatory Visit: Payer: Self-pay | Admitting: Nurse Practitioner

## 2022-11-30 DIAGNOSIS — Z13820 Encounter for screening for osteoporosis: Secondary | ICD-10-CM

## 2022-12-12 ENCOUNTER — Ambulatory Visit (INDEPENDENT_AMBULATORY_CARE_PROVIDER_SITE_OTHER): Payer: 59 | Admitting: Podiatrist

## 2022-12-12 ENCOUNTER — Ambulatory Visit: Payer: 59 | Admitting: Podiatry

## 2022-12-12 DIAGNOSIS — M79674 Pain in right toe(s): Secondary | ICD-10-CM

## 2022-12-12 DIAGNOSIS — B351 Tinea unguium: Secondary | ICD-10-CM

## 2022-12-12 DIAGNOSIS — M79675 Pain in left toe(s): Secondary | ICD-10-CM | POA: Diagnosis not present

## 2022-12-12 NOTE — Progress Notes (Signed)
  Subjective:  Patient ID: [name], female    DOB: 1955/10/04,  MRN: [id number]  [name] presents to clinic today for preventative diabetic foot care and painful elongated mycotic toenails 1-5 bilaterally which are tender when wearing enclosed shoe gear. Pain is relieved with periodic professional debridement.  Chief Complaint  Patient presents with   Nail Problem    DFC BS-do not check A1C-6.9 PCP-[facility] PCP VST-2 weeks ago   New problem(s): None.   PCP is Leonard Downing, MD.  Allergies  Allergen Reactions   Codeine     agitation   Review of Systems: Negative except as noted in the HPI.  Objective: No changes noted in today's physical examination. There were no vitals filed for this visit.  [name] is a pleasant 67 y.o. female WD, WN in NAD. AAO x 3.  Vascular Examination: Palpable pedal pulses b/l LE. Digital hair present b/l. No pedal edema b/l. Skin temperature gradient WNL b/l. No varicosities b/l. Marland Kitchen  Dermatological Examination: Pedal skin with normal turgor, texture and tone b/l. No open wounds. No interdigital macerations b/l. Toenails 1-5 b/l thickened, discolored, dystrophic with subungual debris. There is pain on palpation to dorsal aspect of nailplates. .  Neurological Examination: Protective sensation intact with 10 gram monofilament b/l LE. Vibratory sensation intact b/l LE.   Musculoskeletal Examination: Muscle strength 5/5 to all LE muscle groups b/l. Pes planus deformity noted bilateral LE. Patient ambulates independent of any assistive aids.  Assessment/Plan:   ICD-10-CM   1. Pain due to onychomycosis of toenails of both feet  B35.1    M79.675    M79.674        -Patient was evaluated and treated. All patient's and/or POA's questions/concerns answered on today's visit. -Continue diabetic foot care principles: inspect feet daily, monitor glucose as recommended by PCP and/or Endocrinologist, and follow prescribed diet per  PCP, Endocrinologist and/or dietician. -Continue supportive shoe gear daily. -Toenails 1-5 b/l were debrided in length and girth with sterile nail nippers and dremel without iatrogenic bleeding.  -Patient/POA to call should there be question/concern in the interim.   Return in 3 mo  Bronson Ing, [place]

## 2023-02-14 ENCOUNTER — Other Ambulatory Visit: Payer: Self-pay | Admitting: Family Medicine

## 2023-02-14 DIAGNOSIS — Z0000 Encounter for general adult medical examination without abnormal findings: Secondary | ICD-10-CM

## 2023-02-18 ENCOUNTER — Ambulatory Visit
Admission: RE | Admit: 2023-02-18 | Discharge: 2023-02-18 | Disposition: A | Discharge status: Home / Self Care | Payer: 59 | Source: Ambulatory Visit | Attending: Family Medicine | Admitting: Family Medicine

## 2023-02-18 DIAGNOSIS — Z0000 Encounter for general adult medical examination without abnormal findings: Secondary | ICD-10-CM

## 2023-04-30 ENCOUNTER — Encounter: Payer: Self-pay | Admitting: Podiatry

## 2023-04-30 ENCOUNTER — Ambulatory Visit (INDEPENDENT_AMBULATORY_CARE_PROVIDER_SITE_OTHER): Payer: 59 | Admitting: Podiatry

## 2023-04-30 DIAGNOSIS — M79674 Pain in right toe(s): Secondary | ICD-10-CM | POA: Diagnosis not present

## 2023-04-30 DIAGNOSIS — B351 Tinea unguium: Secondary | ICD-10-CM | POA: Diagnosis not present

## 2023-04-30 DIAGNOSIS — M79675 Pain in left toe(s): Secondary | ICD-10-CM | POA: Diagnosis not present

## 2023-04-30 DIAGNOSIS — M2142 Flat foot [pes planus] (acquired), left foot: Secondary | ICD-10-CM

## 2023-04-30 DIAGNOSIS — M2141 Flat foot [pes planus] (acquired), right foot: Secondary | ICD-10-CM | POA: Diagnosis not present

## 2023-04-30 DIAGNOSIS — E119 Type 2 diabetes mellitus without complications: Secondary | ICD-10-CM

## 2023-05-05 NOTE — Progress Notes (Signed)
ANNUAL DIABETIC FOOT EXAM  Subjective: [name] presents today annual diabetic foot exam.  Chief Complaint  Patient presents with   [facility]    PCP Windle Guard MD LOV unk A1C unk   Patient confirms h/o diabetes.  Patient denies any h/o foot wounds.  Patient denies any numbness, tingling, burning, or pins/needle sensation in feet.  Risk factors: diabetes, HTN, hyperlipidemia, h/o tobacco use in remission.  Kaleen Mask, MD is patient's PCP.  Past Medical History:  Diagnosis Date   Anemia 09/25/2016   Bipolar affective disorder (HCC) 04/29/2012   Borderline personality disorder (HCC)    Chronic back pain    Depression    Diabetes mellitus (HCC) 04/29/2012   Gait difficulty    GERD (gastroesophageal reflux disease)    HTN (hypertension) 04/29/2012   Hyperlipidemia    Hypothyroidism    Memory loss    Schizoaffective disorder (HCC) 04/29/2012   Schizophrenia (HCC)    Patient Active Problem List   Diagnosis Date Noted   AV heart block 04/09/2022   Chest pain of uncertain etiology 02/08/2022   Palpitations 02/06/2022   PVC (premature ventricular contraction) 02/06/2022   Angiodysplasia of duodenum 01/16/2021   Diarrhea 01/16/2021   Iron deficiency anemia 01/16/2021   Gait abnormality 09/11/2018   Memory loss 03/10/2018   Abnormal CT of the head 03/10/2018   Obesity (BMI 30-39.9) 11/06/2017   Schizophrenia, paranoid type (HCC) 05/05/2012   Anemia 04/30/2012   Hyponatremia 04/29/2012   Passive suicidal ideations 04/29/2012   Suicidal ideations 04/29/2012   HTN (hypertension) 04/29/2012   Diabetes mellitus type II, controlled, with no complications (HCC) 04/29/2012   GERD (gastroesophageal reflux disease)    Hyperlipidemia associated with type 2 diabetes mellitus (HCC)    Chronic back pain    Hypothyroidism    Past Surgical History:  Procedure Laterality Date   APPENDECTOMY     CHOLECYSTECTOMY     COLONOSCOPY N/A 09/27/2016   Procedure: COLONOSCOPY;   Surgeon: Jeani Hawking, MD;  Location: [facility] ENDOSCOPY;  Service: Endoscopy;  Laterality: N/A;   ESOPHAGOGASTRODUODENOSCOPY N/A 09/26/2016   Procedure: ESOPHAGOGASTRODUODENOSCOPY (EGD);  Surgeon: Charna Elizabeth, MD;  Location: [facility] ENDOSCOPY;  Service: Endoscopy;  Laterality: N/A;   GIVENS CAPSULE STUDY N/A 09/27/2016   Procedure: GIVENS CAPSULE STUDY;  Surgeon: Jeani Hawking, MD;  Location: [facility] ENDOSCOPY;  Service: Endoscopy;  Laterality: N/A;   KNEE SURGERY     right   Current Outpatient Medications on File Prior to Visit  Medication Sig Dispense Refill   ACCU-CHEK FASTCLIX LANCETS MISC      ACCU-CHEK GUIDE test strip      acetaminophen (TYLENOL) 325 MG tablet Take 650 mg by mouth 3 (three) times daily.     ALLERGY RELIEF 10 MG tablet Take 10 mg by mouth daily.     allopurinol (ZYLOPRIM) 100 MG tablet Take 100 mg by mouth 2 (two) times daily.     atorvastatin (LIPITOR) 40 MG tablet Take 40 mg by mouth daily at 6 PM.     Blood Glucose Monitoring Suppl (ACCU-CHEK GUIDE) w/Device KIT      [employer] ANTACID PLUS ANTIGAS 400-400-40 MG/5ML suspension TAKE 30 ML BY MOUTH EVERY 6 HOURS AS NEEDED FOR GAS PAIN     divalproex (DEPAKOTE ER) 500 MG 24 hr tablet Take 1,000 mg by mouth at bedtime.      donepezil (ARICEPT) 10 MG tablet Take 1 tablet (10 mg total) by mouth daily. Future refills my be managed by PCP. 30 tablet 5  DROPSAFE SAFETY PEN NEEDLES 31G X 6 MM MISC      Ergocalciferol (VITAMIN D2 PO)      escitalopram (LEXAPRO) 20 MG tablet Take 20 mg by mouth every morning.      famotidine (PEPCID) 20 MG tablet Take 20 mg by mouth 2 (two) times daily.     FARXIGA 10 MG TABS tablet Take 10 mg by mouth daily.     Ferrous Sulfate (FEROSUL PO)      fluticasone (FLONASE) 50 MCG/ACT nasal spray Place 2 sprays into both nostrils daily. 16 g 11   GAVILAX 17 GM/SCOOP powder SMARTSIG:1 Capful(s) By Mouth Daily PRN     glipiZIDE (GLUCOTROL) 5 MG tablet Take 5 mg by mouth 2 (two) times daily.     INVEGA TRINZA 819  MG/2.63ML injection Inject 819 mg into the muscle every 3 (three) months.     JANUVIA 50 MG tablet Take 50 mg by mouth daily.     lansoprazole (PREVACID) 15 MG capsule Take 1 capsule (15 mg total) by mouth daily at 12 noon. 30 capsule 2   latanoprost (XALATAN) 0.005 % ophthalmic solution Place 1 drop into both eyes at bedtime.     levothyroxine (SYNTHROID, LEVOTHROID) 100 MCG tablet      LOKELMA 10 g PACK packet Take by mouth.     MOUNJARO 7.5 MG/0.5ML Pen Inject 7.5 mg into the skin once a week.     moxifloxacin (VIGAMOX) 0.5 % ophthalmic solution INSTILL 1 DROP INTO LEFTOEYE FOUR TIMES DAILY STARTING 2 DAYS PRIOR TO SURGERY AND 2 DROPS MORNING OF SURGERY.     PAIN RELIEF EXTRA STRENGTH 500 MG tablet Take by mouth.     pantoprazole (PROTONIX) 20 MG tablet Take 20 mg by mouth daily.     Polyethylene Glycol 3350 (CLEARLAX PO)      SENNA-TABS 8.6 MG tablet Take 2 tablets by mouth daily.     SITagliptin Phosphate (JANUVIA PO)      terconazole (TERAZOL 7) 0.4 % vaginal cream Place 1 applicator vaginally at bedtime. Use for seven days 45 g 0   traZODone (DESYREL) 100 MG tablet Take 100 mg by mouth at bedtime.      Vitamin D, Ergocalciferol, (DRISDOL) 1.25 MG (50000 UT) CAPS capsule Take 50,000 Units by mouth once a week.     No current facility-administered medications on file prior to visit.    Allergies  Allergen Reactions   Codeine     agitation   Social History   Occupational History   Occupation: disabled  Tobacco Use   Smoking status: Former    Current packs/day: 0.00    Types: Cigarettes    Quit date: 2008    Years since quitting: 16.6   Smokeless tobacco: Never  Vaping Use   Vaping status: Never Used  Substance and Sexual Activity   Alcohol use: No    Comment: h/o use but none in years   Drug use: No    Comment: remote h/o drug use - cocaine, marijuana, uppers, downers, qualudes   Sexual activity: Never   Family History  Problem Relation Age of Onset   CVA Mother 23    Dementia Father 34   CAD Maternal Grandmother    Colon cancer Maternal Grandfather    Breast cancer Neg Hx    Immunization History  Administered Date(s) Administered   Influenza,inj,Quad PF,6+ Mos 07/17/2019   Influenza-Unspecified 05/18/2014, 09/07/2019     Review of Systems: Negative except as noted in the HPI.  Objective: There were no vitals filed for this visit.  TIARIA GARSKE is a pleasant 67 y.o. female in NAD. AAO X 3.  Vascular Examination: Capillary refill time immediate b/l. Vascular status intact b/l with palpable pedal pulses. Pedal hair present b/l. No pain with calf compression b/l. Skin temperature gradient WNL b/l. No cyanosis or clubbing b/l. No ischemia or gangrene noted b/l.   Neurological Examination: Sensation grossly intact b/l with 10 gram monofilament. Vibratory sensation intact b/l.   Dermatological Examination: Pedal skin with normal turgor, texture and tone b/l.  No open wounds. No interdigital macerations.   Toenails 1-5 b/l thick, discolored, elongated with subungual debris and pain on dorsal palpation.   No corns, calluses nor porokeratotic lesions noted.  Musculoskeletal Examination: Normal muscle strength 5/5 to all lower extremity muscle groups bilaterally. No pain, crepitus or joint limitation noted with ROM b/l LE. Pes planus deformity noted bilateral LE.Marland Kitchen Patient ambulates with cane assistance.  Radiographs: None  Lab Results  Component Value Date   HGBA1C 6.8 (H) 03/10/2018   ADA Risk Categorization: Low Risk :  Patient has all of the following: Intact protective sensation No prior foot ulcer  No severe deformity Pedal pulses present  Assessment: 1. Pain due to onychomycosis of toenails of both feet   2. Pes planus of both feet   3. Controlled type 2 diabetes mellitus without complication, without long-term current use of insulin (HCC)   4. Encounter for diabetic foot exam [facility])     Plan:  -Patient was evaluated and  treated. All patient's and/or POA's questions/concerns answered on today's visit. -Diabetic foot examination performed today. -Continue diabetic foot care principles: inspect feet daily, monitor glucose as recommended by PCP and/or Endocrinologist, and follow prescribed diet per PCP, Endocrinologist and/or dietician. -Patient to continue soft, supportive shoe gear daily. -Mycotic toenails 1-5 bilaterally were debrided in length and girth with sterile nail nippers and dremel without incident. -Patient/POA to call should there be question/concern in the interim. Return in about 3 months (around 07/31/2023).  Freddie Breech, DPM

## 2023-08-19 ENCOUNTER — Ambulatory Visit (INDEPENDENT_AMBULATORY_CARE_PROVIDER_SITE_OTHER): Payer: 59 | Admitting: Podiatry

## 2023-08-19 ENCOUNTER — Encounter: Payer: Self-pay | Admitting: Podiatry

## 2023-08-19 DIAGNOSIS — M79674 Pain in right toe(s): Secondary | ICD-10-CM | POA: Diagnosis not present

## 2023-08-19 DIAGNOSIS — B351 Tinea unguium: Secondary | ICD-10-CM | POA: Diagnosis not present

## 2023-08-19 DIAGNOSIS — E119 Type 2 diabetes mellitus without complications: Secondary | ICD-10-CM

## 2023-08-19 DIAGNOSIS — M79675 Pain in left toe(s): Secondary | ICD-10-CM | POA: Diagnosis not present

## 2023-08-19 NOTE — Progress Notes (Signed)
This patient returns to my office for at risk foot care.  This patient requires this care by a professional since this patient will be at risk due to having diabetes.   This patient is unable to cut nails herself since the patient cannot reach her nails.These nails are painful walking and wearing shoes.  This patient presents for at risk foot care today. ? ?General Appearance  Alert, conversant and in no acute stress. ? ?Vascular  Dorsalis pedis and posterior tibial  pulses are palpable  bilaterally.  Capillary return is within normal limits  bilaterally. Temperature is within normal limits  bilaterally. ? ?Neurologic  Senn-Weinstein monofilament wire test within normal limits  bilaterally. Muscle power within normal limits bilaterally. ? ?Nails Thick disfigured discolored nails with subungual debris  hallux nails bilaterally. No evidence of bacterial infection or drainage bilaterally. ? ?Orthopedic  No limitations of motion  feet .  No crepitus or effusions noted.  No bony pathology or digital deformities noted. ? ?Skin  normotropic skin with no porokeratosis noted bilaterally.  No signs of infections or ulcers noted.    ? ?Onychomycosis  Pain in right toes  Pain in left toes ? ?Consent was obtained for treatment procedures.   Mechanical debridement of nails 1-5  bilaterally performed with a nail nipper.  Filed with dremel without incident.  ? ? ?Return office visit  3 months                   Told patient to return for periodic foot care and evaluation due to potential at risk complications. ? ? ?Gardiner Barefoot DPM   ?

## 2023-11-25 ENCOUNTER — Ambulatory Visit (INDEPENDENT_AMBULATORY_CARE_PROVIDER_SITE_OTHER): Payer: 59 | Admitting: Podiatry

## 2023-11-25 ENCOUNTER — Encounter: Payer: Self-pay | Admitting: Podiatry

## 2023-11-25 DIAGNOSIS — E119 Type 2 diabetes mellitus without complications: Secondary | ICD-10-CM

## 2023-11-25 DIAGNOSIS — B351 Tinea unguium: Secondary | ICD-10-CM

## 2023-11-25 DIAGNOSIS — M79675 Pain in left toe(s): Secondary | ICD-10-CM

## 2023-11-25 DIAGNOSIS — M79674 Pain in right toe(s): Secondary | ICD-10-CM

## 2023-11-25 NOTE — Progress Notes (Signed)
This patient returns to my office for at risk foot care.  This patient requires this care by a professional since this patient will be at risk due to having diabetes.   This patient is unable to cut nails herself since the patient cannot reach her nails.These nails are painful walking and wearing shoes.  This patient presents for at risk foot care today. ? ?General Appearance  Alert, conversant and in no acute stress. ? ?Vascular  Dorsalis pedis and posterior tibial  pulses are palpable  bilaterally.  Capillary return is within normal limits  bilaterally. Temperature is within normal limits  bilaterally. ? ?Neurologic  Senn-Weinstein monofilament wire test within normal limits  bilaterally. Muscle power within normal limits bilaterally. ? ?Nails Thick disfigured discolored nails with subungual debris  hallux nails bilaterally. No evidence of bacterial infection or drainage bilaterally. ? ?Orthopedic  No limitations of motion  feet .  No crepitus or effusions noted.  No bony pathology or digital deformities noted. ? ?Skin  normotropic skin with no porokeratosis noted bilaterally.  No signs of infections or ulcers noted.    ? ?Onychomycosis  Pain in right toes  Pain in left toes ? ?Consent was obtained for treatment procedures.   Mechanical debridement of nails 1-5  bilaterally performed with a nail nipper.  Filed with dremel without incident.  ? ? ?Return office visit  3 months                   Told patient to return for periodic foot care and evaluation due to potential at risk complications. ? ? ?Gardiner Barefoot DPM   ?

## 2024-02-25 ENCOUNTER — Encounter: Payer: Self-pay | Admitting: Podiatry

## 2024-02-25 ENCOUNTER — Ambulatory Visit (INDEPENDENT_AMBULATORY_CARE_PROVIDER_SITE_OTHER): Admitting: Podiatry

## 2024-02-25 DIAGNOSIS — E119 Type 2 diabetes mellitus without complications: Secondary | ICD-10-CM | POA: Diagnosis not present

## 2024-02-25 DIAGNOSIS — M79674 Pain in right toe(s): Secondary | ICD-10-CM

## 2024-02-25 DIAGNOSIS — M79675 Pain in left toe(s): Secondary | ICD-10-CM | POA: Diagnosis not present

## 2024-02-25 DIAGNOSIS — B351 Tinea unguium: Secondary | ICD-10-CM | POA: Diagnosis not present

## 2024-02-25 NOTE — Progress Notes (Signed)
This patient returns to my office for at risk foot care.  This patient requires this care by a professional since this patient will be at risk due to having diabetes.   This patient is unable to cut nails herself since the patient cannot reach her nails.These nails are painful walking and wearing shoes.  This patient presents for at risk foot care today. ? ?General Appearance  Alert, conversant and in no acute stress. ? ?Vascular  Dorsalis pedis and posterior tibial  pulses are palpable  bilaterally.  Capillary return is within normal limits  bilaterally. Temperature is within normal limits  bilaterally. ? ?Neurologic  Senn-Weinstein monofilament wire test within normal limits  bilaterally. Muscle power within normal limits bilaterally. ? ?Nails Thick disfigured discolored nails with subungual debris  hallux nails bilaterally. No evidence of bacterial infection or drainage bilaterally. ? ?Orthopedic  No limitations of motion  feet .  No crepitus or effusions noted.  No bony pathology or digital deformities noted. ? ?Skin  normotropic skin with no porokeratosis noted bilaterally.  No signs of infections or ulcers noted.    ? ?Onychomycosis  Pain in right toes  Pain in left toes ? ?Consent was obtained for treatment procedures.   Mechanical debridement of nails 1-5  bilaterally performed with a nail nipper.  Filed with dremel without incident.  ? ? ?Return office visit  3 months                   Told patient to return for periodic foot care and evaluation due to potential at risk complications. ? ? ?Gardiner Barefoot DPM   ?

## 2024-03-12 ENCOUNTER — Other Ambulatory Visit: Payer: Self-pay | Admitting: Family Medicine

## 2024-03-12 DIAGNOSIS — Z1231 Encounter for screening mammogram for malignant neoplasm of breast: Secondary | ICD-10-CM

## 2024-03-24 ENCOUNTER — Ambulatory Visit

## 2024-04-03 ENCOUNTER — Ambulatory Visit
Admission: RE | Admit: 2024-04-03 | Discharge: 2024-04-03 | Disposition: A | Discharge status: Home / Self Care | Source: Ambulatory Visit | Attending: Family Medicine | Admitting: Family Medicine

## 2024-04-03 DIAGNOSIS — Z1231 Encounter for screening mammogram for malignant neoplasm of breast: Secondary | ICD-10-CM

## 2024-05-27 ENCOUNTER — Ambulatory Visit: Admitting: Podiatry

## 2024-06-04 ENCOUNTER — Encounter: Payer: Self-pay | Admitting: Podiatry

## 2024-06-04 ENCOUNTER — Ambulatory Visit (INDEPENDENT_AMBULATORY_CARE_PROVIDER_SITE_OTHER): Admitting: Podiatry

## 2024-06-04 DIAGNOSIS — E119 Type 2 diabetes mellitus without complications: Secondary | ICD-10-CM

## 2024-06-04 DIAGNOSIS — B351 Tinea unguium: Secondary | ICD-10-CM | POA: Diagnosis not present

## 2024-06-04 DIAGNOSIS — M79674 Pain in right toe(s): Secondary | ICD-10-CM

## 2024-06-04 DIAGNOSIS — M79675 Pain in left toe(s): Secondary | ICD-10-CM | POA: Diagnosis not present

## 2024-06-04 NOTE — Progress Notes (Signed)
This patient returns to my office for at risk foot care.  This patient requires this care by a professional since this patient will be at risk due to having diabetes.   This patient is unable to cut nails herself since the patient cannot reach her nails.These nails are painful walking and wearing shoes.  This patient presents for at risk foot care today. ? ?General Appearance  Alert, conversant and in no acute stress. ? ?Vascular  Dorsalis pedis and posterior tibial  pulses are palpable  bilaterally.  Capillary return is within normal limits  bilaterally. Temperature is within normal limits  bilaterally. ? ?Neurologic  Senn-Weinstein monofilament wire test within normal limits  bilaterally. Muscle power within normal limits bilaterally. ? ?Nails Thick disfigured discolored nails with subungual debris  hallux nails bilaterally. No evidence of bacterial infection or drainage bilaterally. ? ?Orthopedic  No limitations of motion  feet .  No crepitus or effusions noted.  No bony pathology or digital deformities noted. ? ?Skin  normotropic skin with no porokeratosis noted bilaterally.  No signs of infections or ulcers noted.    ? ?Onychomycosis  Pain in right toes  Pain in left toes ? ?Consent was obtained for treatment procedures.   Mechanical debridement of nails 1-5  bilaterally performed with a nail nipper.  Filed with dremel without incident.  ? ? ?Return office visit  3 months                   Told patient to return for periodic foot care and evaluation due to potential at risk complications. ? ? ?Gardiner Barefoot DPM   ?

## 2024-09-03 ENCOUNTER — Ambulatory Visit: Admitting: Podiatry

## 2024-09-22 ENCOUNTER — Ambulatory Visit: Admitting: Podiatry

## 2024-09-22 DIAGNOSIS — B351 Tinea unguium: Secondary | ICD-10-CM

## 2024-09-22 DIAGNOSIS — E119 Type 2 diabetes mellitus without complications: Secondary | ICD-10-CM

## 2024-09-22 DIAGNOSIS — M79675 Pain in left toe(s): Secondary | ICD-10-CM

## 2024-09-22 DIAGNOSIS — M79674 Pain in right toe(s): Secondary | ICD-10-CM

## 2024-09-22 NOTE — Progress Notes (Signed)
 This patient returns to my office for at risk foot care.  This patient requires this care by a professional since this patient will be at risk due to having diabetes.  This patient is unable to cut nails herself since the patient cannot reach her nails.These nails are painful walking and wearing shoes.  This patient presents for at risk foot care today.  General Appearance  Alert, conversant and in no acute stress.  Vascular  Dorsalis pedis and posterior tibial  pulses are palpable  bilaterally.  Capillary return is within normal limits  bilaterally. Temperature is within normal limits  bilaterally.  Neurologic  Senn-Weinstein monofilament wire test within normal limits  bilaterally. Muscle power within normal limits bilaterally.  Nails Thick disfigured discolored nails with subungual debris  hallux nailsbilaterally. No evidence of bacterial infection or drainage bilaterally.  Orthopedic  No limitations of motion  feet .  No crepitus or effusions noted.  No bony pathology or digital deformities noted.  Skin  normotropic skin with no porokeratosis noted bilaterally.  No signs of infections or ulcers noted.     Onychomycosis  Pain in right toes  Pain in left toes  Consent was obtained for treatment procedures.   Mechanical debridement of nails 1-5  bilaterally performed with a nail nipper.  Filed with dremel without incident.    Return office visit    4 months                 Told patient to return for periodic foot care and evaluation due to potential at risk complications.   Helane Gunther DPM

## 2024-12-22 ENCOUNTER — Ambulatory Visit: Admitting: Podiatry
